# Patient Record
Sex: Female | Born: 1955
Health system: Southern US, Community
[De-identification: ages and names within clinical notes are randomized; demographics above are authoritative.]

## PROBLEM LIST (undated history)

## (undated) DIAGNOSIS — R918 Other nonspecific abnormal finding of lung field: Secondary | ICD-10-CM

## (undated) DIAGNOSIS — K219 Gastro-esophageal reflux disease without esophagitis: Secondary | ICD-10-CM

## (undated) DIAGNOSIS — M797 Fibromyalgia: Secondary | ICD-10-CM

## (undated) DIAGNOSIS — I10 Essential (primary) hypertension: Secondary | ICD-10-CM

## (undated) DIAGNOSIS — E039 Hypothyroidism, unspecified: Secondary | ICD-10-CM

## (undated) DIAGNOSIS — M51369 Other intervertebral disc degeneration, lumbar region without mention of lumbar back pain or lower extremity pain: Secondary | ICD-10-CM

## (undated) DIAGNOSIS — M5136 Other intervertebral disc degeneration, lumbar region: Secondary | ICD-10-CM

## (undated) DIAGNOSIS — G4730 Sleep apnea, unspecified: Secondary | ICD-10-CM

## (undated) DIAGNOSIS — J449 Chronic obstructive pulmonary disease, unspecified: Secondary | ICD-10-CM

## (undated) DIAGNOSIS — F411 Generalized anxiety disorder: Secondary | ICD-10-CM

## (undated) HISTORY — DX: Other intervertebral disc degeneration, lumbar region without mention of lumbar back pain or lower extremity pain: M51.369

## (undated) HISTORY — DX: Hypothyroidism, unspecified: E03.9

## (undated) HISTORY — PX: SPINAL CORD STIMULATOR INSERTION: SHX5378

## (undated) HISTORY — DX: Other intervertebral disc degeneration, lumbar region: M51.36

## (undated) HISTORY — PX: CHOLECYSTECTOMY: SHX55

## (undated) HISTORY — DX: Chronic obstructive pulmonary disease, unspecified: J44.9

## (undated) HISTORY — DX: Other nonspecific abnormal finding of lung field: R91.8

## (undated) HISTORY — DX: Essential (primary) hypertension: I10

## (undated) HISTORY — DX: Fibromyalgia: M79.7

## (undated) HISTORY — DX: Generalized anxiety disorder: F41.1

## (undated) HISTORY — PX: LAPAROSCOPIC ASSISTED VAGINAL HYSTERECTOMY: SHX5398

## (undated) HISTORY — PX: DILATION AND CURETTAGE OF UTERUS: SHX78

---

## 1978-02-24 HISTORY — PX: OTHER SURGICAL HISTORY: SHX169

## 2015-04-09 DIAGNOSIS — G894 Chronic pain syndrome: Secondary | ICD-10-CM | POA: Diagnosis not present

## 2015-04-09 DIAGNOSIS — Z79891 Long term (current) use of opiate analgesic: Secondary | ICD-10-CM | POA: Diagnosis not present

## 2015-04-09 DIAGNOSIS — M5412 Radiculopathy, cervical region: Secondary | ICD-10-CM | POA: Diagnosis not present

## 2015-04-09 DIAGNOSIS — M5136 Other intervertebral disc degeneration, lumbar region: Secondary | ICD-10-CM | POA: Diagnosis not present

## 2015-04-09 DIAGNOSIS — Z5181 Encounter for therapeutic drug level monitoring: Secondary | ICD-10-CM | POA: Diagnosis not present

## 2015-04-30 DIAGNOSIS — M5412 Radiculopathy, cervical region: Secondary | ICD-10-CM | POA: Diagnosis not present

## 2015-07-03 DIAGNOSIS — Z79891 Long term (current) use of opiate analgesic: Secondary | ICD-10-CM | POA: Diagnosis not present

## 2015-07-03 DIAGNOSIS — M5137 Other intervertebral disc degeneration, lumbosacral region: Secondary | ICD-10-CM | POA: Diagnosis not present

## 2015-07-03 DIAGNOSIS — M5412 Radiculopathy, cervical region: Secondary | ICD-10-CM | POA: Diagnosis not present

## 2015-07-03 DIAGNOSIS — G894 Chronic pain syndrome: Secondary | ICD-10-CM | POA: Diagnosis not present

## 2015-10-03 DIAGNOSIS — G894 Chronic pain syndrome: Secondary | ICD-10-CM | POA: Diagnosis not present

## 2015-10-03 DIAGNOSIS — M5137 Other intervertebral disc degeneration, lumbosacral region: Secondary | ICD-10-CM | POA: Diagnosis not present

## 2015-10-03 DIAGNOSIS — M5412 Radiculopathy, cervical region: Secondary | ICD-10-CM | POA: Diagnosis not present

## 2015-10-03 DIAGNOSIS — Z79891 Long term (current) use of opiate analgesic: Secondary | ICD-10-CM | POA: Diagnosis not present

## 2015-12-06 DIAGNOSIS — Z23 Encounter for immunization: Secondary | ICD-10-CM | POA: Diagnosis not present

## 2015-12-06 DIAGNOSIS — F332 Major depressive disorder, recurrent severe without psychotic features: Secondary | ICD-10-CM | POA: Diagnosis not present

## 2015-12-06 DIAGNOSIS — J449 Chronic obstructive pulmonary disease, unspecified: Secondary | ICD-10-CM | POA: Diagnosis not present

## 2015-12-06 DIAGNOSIS — K219 Gastro-esophageal reflux disease without esophagitis: Secondary | ICD-10-CM | POA: Diagnosis not present

## 2015-12-06 DIAGNOSIS — E559 Vitamin D deficiency, unspecified: Secondary | ICD-10-CM | POA: Diagnosis not present

## 2015-12-06 DIAGNOSIS — I10 Essential (primary) hypertension: Secondary | ICD-10-CM | POA: Diagnosis not present

## 2015-12-06 DIAGNOSIS — M810 Age-related osteoporosis without current pathological fracture: Secondary | ICD-10-CM | POA: Diagnosis not present

## 2015-12-06 DIAGNOSIS — Z6835 Body mass index (BMI) 35.0-35.9, adult: Secondary | ICD-10-CM | POA: Diagnosis not present

## 2015-12-06 DIAGNOSIS — R5382 Chronic fatigue, unspecified: Secondary | ICD-10-CM | POA: Diagnosis not present

## 2015-12-06 DIAGNOSIS — F17219 Nicotine dependence, cigarettes, with unspecified nicotine-induced disorders: Secondary | ICD-10-CM | POA: Diagnosis not present

## 2015-12-06 DIAGNOSIS — G894 Chronic pain syndrome: Secondary | ICD-10-CM | POA: Diagnosis not present

## 2015-12-06 DIAGNOSIS — E782 Mixed hyperlipidemia: Secondary | ICD-10-CM | POA: Diagnosis not present

## 2015-12-06 DIAGNOSIS — E038 Other specified hypothyroidism: Secondary | ICD-10-CM | POA: Diagnosis not present

## 2015-12-06 DIAGNOSIS — K9089 Other intestinal malabsorption: Secondary | ICD-10-CM | POA: Diagnosis not present

## 2015-12-06 DIAGNOSIS — E669 Obesity, unspecified: Secondary | ICD-10-CM | POA: Diagnosis not present

## 2015-12-08 DIAGNOSIS — R5382 Chronic fatigue, unspecified: Secondary | ICD-10-CM | POA: Diagnosis not present

## 2015-12-08 DIAGNOSIS — M810 Age-related osteoporosis without current pathological fracture: Secondary | ICD-10-CM | POA: Diagnosis not present

## 2015-12-08 DIAGNOSIS — E782 Mixed hyperlipidemia: Secondary | ICD-10-CM | POA: Diagnosis not present

## 2015-12-08 DIAGNOSIS — E559 Vitamin D deficiency, unspecified: Secondary | ICD-10-CM | POA: Diagnosis not present

## 2015-12-08 DIAGNOSIS — Z79899 Other long term (current) drug therapy: Secondary | ICD-10-CM | POA: Diagnosis not present

## 2015-12-08 DIAGNOSIS — E038 Other specified hypothyroidism: Secondary | ICD-10-CM | POA: Diagnosis not present

## 2015-12-27 DIAGNOSIS — J441 Chronic obstructive pulmonary disease with (acute) exacerbation: Secondary | ICD-10-CM | POA: Diagnosis not present

## 2015-12-27 DIAGNOSIS — D509 Iron deficiency anemia, unspecified: Secondary | ICD-10-CM | POA: Diagnosis not present

## 2015-12-27 DIAGNOSIS — F17219 Nicotine dependence, cigarettes, with unspecified nicotine-induced disorders: Secondary | ICD-10-CM | POA: Diagnosis not present

## 2015-12-27 DIAGNOSIS — E782 Mixed hyperlipidemia: Secondary | ICD-10-CM | POA: Diagnosis not present

## 2015-12-27 DIAGNOSIS — F332 Major depressive disorder, recurrent severe without psychotic features: Secondary | ICD-10-CM | POA: Diagnosis not present

## 2016-01-01 DIAGNOSIS — M5412 Radiculopathy, cervical region: Secondary | ICD-10-CM | POA: Diagnosis not present

## 2016-01-01 DIAGNOSIS — M5137 Other intervertebral disc degeneration, lumbosacral region: Secondary | ICD-10-CM | POA: Diagnosis not present

## 2016-01-01 DIAGNOSIS — Z79891 Long term (current) use of opiate analgesic: Secondary | ICD-10-CM | POA: Diagnosis not present

## 2016-01-01 DIAGNOSIS — G894 Chronic pain syndrome: Secondary | ICD-10-CM | POA: Diagnosis not present

## 2016-01-29 DIAGNOSIS — M5412 Radiculopathy, cervical region: Secondary | ICD-10-CM | POA: Diagnosis not present

## 2016-01-29 DIAGNOSIS — G894 Chronic pain syndrome: Secondary | ICD-10-CM | POA: Diagnosis not present

## 2016-01-29 DIAGNOSIS — M5137 Other intervertebral disc degeneration, lumbosacral region: Secondary | ICD-10-CM | POA: Diagnosis not present

## 2016-03-01 DIAGNOSIS — S92302A Fracture of unspecified metatarsal bone(s), left foot, initial encounter for closed fracture: Secondary | ICD-10-CM | POA: Diagnosis not present

## 2016-03-01 DIAGNOSIS — S82402A Unspecified fracture of shaft of left fibula, initial encounter for closed fracture: Secondary | ICD-10-CM | POA: Diagnosis not present

## 2016-03-04 DIAGNOSIS — S92302A Fracture of unspecified metatarsal bone(s), left foot, initial encounter for closed fracture: Secondary | ICD-10-CM | POA: Diagnosis not present

## 2016-03-04 DIAGNOSIS — S92322A Displaced fracture of second metatarsal bone, left foot, initial encounter for closed fracture: Secondary | ICD-10-CM | POA: Diagnosis not present

## 2016-03-04 DIAGNOSIS — X58XXXA Exposure to other specified factors, initial encounter: Secondary | ICD-10-CM | POA: Diagnosis not present

## 2016-03-04 DIAGNOSIS — G894 Chronic pain syndrome: Secondary | ICD-10-CM | POA: Diagnosis not present

## 2016-03-04 DIAGNOSIS — S92342A Displaced fracture of fourth metatarsal bone, left foot, initial encounter for closed fracture: Secondary | ICD-10-CM | POA: Diagnosis not present

## 2016-03-04 DIAGNOSIS — S92332A Displaced fracture of third metatarsal bone, left foot, initial encounter for closed fracture: Secondary | ICD-10-CM | POA: Diagnosis not present

## 2016-03-04 DIAGNOSIS — M5412 Radiculopathy, cervical region: Secondary | ICD-10-CM | POA: Diagnosis not present

## 2016-03-04 DIAGNOSIS — S92309A Fracture of unspecified metatarsal bone(s), unspecified foot, initial encounter for closed fracture: Secondary | ICD-10-CM | POA: Diagnosis not present

## 2016-03-04 DIAGNOSIS — M5137 Other intervertebral disc degeneration, lumbosacral region: Secondary | ICD-10-CM | POA: Diagnosis not present

## 2016-03-04 DIAGNOSIS — S92315A Nondisplaced fracture of first metatarsal bone, left foot, initial encounter for closed fracture: Secondary | ICD-10-CM | POA: Diagnosis not present

## 2016-03-25 DIAGNOSIS — S92309A Fracture of unspecified metatarsal bone(s), unspecified foot, initial encounter for closed fracture: Secondary | ICD-10-CM | POA: Diagnosis not present

## 2016-03-25 DIAGNOSIS — S92342D Displaced fracture of fourth metatarsal bone, left foot, subsequent encounter for fracture with routine healing: Secondary | ICD-10-CM | POA: Diagnosis not present

## 2016-03-25 DIAGNOSIS — S92332D Displaced fracture of third metatarsal bone, left foot, subsequent encounter for fracture with routine healing: Secondary | ICD-10-CM | POA: Diagnosis not present

## 2016-03-25 DIAGNOSIS — S92322D Displaced fracture of second metatarsal bone, left foot, subsequent encounter for fracture with routine healing: Secondary | ICD-10-CM | POA: Diagnosis not present

## 2016-04-04 DIAGNOSIS — E782 Mixed hyperlipidemia: Secondary | ICD-10-CM | POA: Diagnosis not present

## 2016-04-04 DIAGNOSIS — M5412 Radiculopathy, cervical region: Secondary | ICD-10-CM | POA: Diagnosis not present

## 2016-04-04 DIAGNOSIS — G894 Chronic pain syndrome: Secondary | ICD-10-CM | POA: Diagnosis not present

## 2016-04-04 DIAGNOSIS — M5137 Other intervertebral disc degeneration, lumbosacral region: Secondary | ICD-10-CM | POA: Diagnosis not present

## 2016-04-04 DIAGNOSIS — Z79891 Long term (current) use of opiate analgesic: Secondary | ICD-10-CM | POA: Diagnosis not present

## 2016-04-18 DIAGNOSIS — M5412 Radiculopathy, cervical region: Secondary | ICD-10-CM | POA: Diagnosis not present

## 2016-04-22 DIAGNOSIS — S92342D Displaced fracture of fourth metatarsal bone, left foot, subsequent encounter for fracture with routine healing: Secondary | ICD-10-CM | POA: Diagnosis not present

## 2016-04-22 DIAGNOSIS — S92302A Fracture of unspecified metatarsal bone(s), left foot, initial encounter for closed fracture: Secondary | ICD-10-CM | POA: Diagnosis not present

## 2016-04-22 DIAGNOSIS — S92302D Fracture of unspecified metatarsal bone(s), left foot, subsequent encounter for fracture with routine healing: Secondary | ICD-10-CM | POA: Diagnosis not present

## 2016-05-02 DIAGNOSIS — G894 Chronic pain syndrome: Secondary | ICD-10-CM | POA: Diagnosis not present

## 2016-05-02 DIAGNOSIS — M5137 Other intervertebral disc degeneration, lumbosacral region: Secondary | ICD-10-CM | POA: Diagnosis not present

## 2016-05-02 DIAGNOSIS — M5412 Radiculopathy, cervical region: Secondary | ICD-10-CM | POA: Diagnosis not present

## 2016-05-20 DIAGNOSIS — S92332D Displaced fracture of third metatarsal bone, left foot, subsequent encounter for fracture with routine healing: Secondary | ICD-10-CM | POA: Diagnosis not present

## 2016-05-20 DIAGNOSIS — S92302D Fracture of unspecified metatarsal bone(s), left foot, subsequent encounter for fracture with routine healing: Secondary | ICD-10-CM | POA: Diagnosis not present

## 2016-05-20 DIAGNOSIS — S92342D Displaced fracture of fourth metatarsal bone, left foot, subsequent encounter for fracture with routine healing: Secondary | ICD-10-CM | POA: Diagnosis not present

## 2016-05-20 DIAGNOSIS — S92309A Fracture of unspecified metatarsal bone(s), unspecified foot, initial encounter for closed fracture: Secondary | ICD-10-CM | POA: Diagnosis not present

## 2016-05-20 DIAGNOSIS — S92322D Displaced fracture of second metatarsal bone, left foot, subsequent encounter for fracture with routine healing: Secondary | ICD-10-CM | POA: Diagnosis not present

## 2016-05-29 DIAGNOSIS — M5412 Radiculopathy, cervical region: Secondary | ICD-10-CM | POA: Diagnosis not present

## 2016-05-29 DIAGNOSIS — M5137 Other intervertebral disc degeneration, lumbosacral region: Secondary | ICD-10-CM | POA: Diagnosis not present

## 2016-05-29 DIAGNOSIS — G894 Chronic pain syndrome: Secondary | ICD-10-CM | POA: Diagnosis not present

## 2016-07-08 DIAGNOSIS — I10 Essential (primary) hypertension: Secondary | ICD-10-CM | POA: Diagnosis not present

## 2016-07-08 DIAGNOSIS — K219 Gastro-esophageal reflux disease without esophagitis: Secondary | ICD-10-CM | POA: Diagnosis not present

## 2016-07-08 DIAGNOSIS — E785 Hyperlipidemia, unspecified: Secondary | ICD-10-CM | POA: Diagnosis not present

## 2016-07-08 DIAGNOSIS — Z720 Tobacco use: Secondary | ICD-10-CM | POA: Diagnosis not present

## 2016-07-08 DIAGNOSIS — E039 Hypothyroidism, unspecified: Secondary | ICD-10-CM | POA: Diagnosis not present

## 2016-07-08 DIAGNOSIS — G8929 Other chronic pain: Secondary | ICD-10-CM | POA: Diagnosis not present

## 2016-07-08 DIAGNOSIS — J449 Chronic obstructive pulmonary disease, unspecified: Secondary | ICD-10-CM | POA: Diagnosis not present

## 2016-09-08 DIAGNOSIS — K219 Gastro-esophageal reflux disease without esophagitis: Secondary | ICD-10-CM | POA: Diagnosis not present

## 2016-09-08 DIAGNOSIS — K130 Diseases of lips: Secondary | ICD-10-CM | POA: Diagnosis not present

## 2016-09-08 DIAGNOSIS — E039 Hypothyroidism, unspecified: Secondary | ICD-10-CM | POA: Diagnosis not present

## 2016-09-08 DIAGNOSIS — E559 Vitamin D deficiency, unspecified: Secondary | ICD-10-CM | POA: Diagnosis not present

## 2016-09-08 DIAGNOSIS — E785 Hyperlipidemia, unspecified: Secondary | ICD-10-CM | POA: Diagnosis not present

## 2016-09-08 DIAGNOSIS — J449 Chronic obstructive pulmonary disease, unspecified: Secondary | ICD-10-CM | POA: Diagnosis not present

## 2016-09-08 DIAGNOSIS — Z720 Tobacco use: Secondary | ICD-10-CM | POA: Diagnosis not present

## 2016-09-08 DIAGNOSIS — D649 Anemia, unspecified: Secondary | ICD-10-CM | POA: Diagnosis not present

## 2016-09-08 DIAGNOSIS — G8929 Other chronic pain: Secondary | ICD-10-CM | POA: Diagnosis not present

## 2016-09-08 DIAGNOSIS — I10 Essential (primary) hypertension: Secondary | ICD-10-CM | POA: Diagnosis not present

## 2016-09-08 DIAGNOSIS — E538 Deficiency of other specified B group vitamins: Secondary | ICD-10-CM | POA: Diagnosis not present

## 2016-09-08 DIAGNOSIS — Z0000 Encounter for general adult medical examination without abnormal findings: Secondary | ICD-10-CM | POA: Diagnosis not present

## 2016-10-01 DIAGNOSIS — M79672 Pain in left foot: Secondary | ICD-10-CM | POA: Diagnosis not present

## 2016-10-01 DIAGNOSIS — M4726 Other spondylosis with radiculopathy, lumbar region: Secondary | ICD-10-CM | POA: Diagnosis not present

## 2016-10-01 DIAGNOSIS — M797 Fibromyalgia: Secondary | ICD-10-CM | POA: Diagnosis not present

## 2016-10-01 DIAGNOSIS — M25551 Pain in right hip: Secondary | ICD-10-CM | POA: Diagnosis not present

## 2016-10-01 DIAGNOSIS — G894 Chronic pain syndrome: Secondary | ICD-10-CM | POA: Diagnosis not present

## 2016-10-01 DIAGNOSIS — M150 Primary generalized (osteo)arthritis: Secondary | ICD-10-CM | POA: Diagnosis not present

## 2016-10-01 DIAGNOSIS — M4722 Other spondylosis with radiculopathy, cervical region: Secondary | ICD-10-CM | POA: Diagnosis not present

## 2016-10-20 DIAGNOSIS — M4722 Other spondylosis with radiculopathy, cervical region: Secondary | ICD-10-CM | POA: Diagnosis not present

## 2016-10-20 DIAGNOSIS — M4726 Other spondylosis with radiculopathy, lumbar region: Secondary | ICD-10-CM | POA: Diagnosis not present

## 2016-10-20 DIAGNOSIS — G894 Chronic pain syndrome: Secondary | ICD-10-CM | POA: Diagnosis not present

## 2016-10-20 DIAGNOSIS — M150 Primary generalized (osteo)arthritis: Secondary | ICD-10-CM | POA: Diagnosis not present

## 2016-10-28 DIAGNOSIS — D649 Anemia, unspecified: Secondary | ICD-10-CM | POA: Diagnosis not present

## 2016-11-14 DIAGNOSIS — M150 Primary generalized (osteo)arthritis: Secondary | ICD-10-CM | POA: Diagnosis not present

## 2016-11-14 DIAGNOSIS — M4726 Other spondylosis with radiculopathy, lumbar region: Secondary | ICD-10-CM | POA: Diagnosis not present

## 2016-11-14 DIAGNOSIS — M4722 Other spondylosis with radiculopathy, cervical region: Secondary | ICD-10-CM | POA: Diagnosis not present

## 2016-11-14 DIAGNOSIS — G894 Chronic pain syndrome: Secondary | ICD-10-CM | POA: Diagnosis not present

## 2016-11-29 DIAGNOSIS — Z23 Encounter for immunization: Secondary | ICD-10-CM | POA: Diagnosis not present

## 2016-12-11 DIAGNOSIS — G894 Chronic pain syndrome: Secondary | ICD-10-CM | POA: Diagnosis not present

## 2016-12-11 DIAGNOSIS — I10 Essential (primary) hypertension: Secondary | ICD-10-CM | POA: Diagnosis not present

## 2016-12-11 DIAGNOSIS — M4722 Other spondylosis with radiculopathy, cervical region: Secondary | ICD-10-CM | POA: Diagnosis not present

## 2016-12-11 DIAGNOSIS — D649 Anemia, unspecified: Secondary | ICD-10-CM | POA: Diagnosis not present

## 2016-12-11 DIAGNOSIS — E876 Hypokalemia: Secondary | ICD-10-CM | POA: Diagnosis not present

## 2016-12-11 DIAGNOSIS — M150 Primary generalized (osteo)arthritis: Secondary | ICD-10-CM | POA: Diagnosis not present

## 2016-12-11 DIAGNOSIS — G8929 Other chronic pain: Secondary | ICD-10-CM | POA: Diagnosis not present

## 2016-12-11 DIAGNOSIS — G4733 Obstructive sleep apnea (adult) (pediatric): Secondary | ICD-10-CM | POA: Diagnosis not present

## 2016-12-11 DIAGNOSIS — Z1211 Encounter for screening for malignant neoplasm of colon: Secondary | ICD-10-CM | POA: Diagnosis not present

## 2016-12-11 DIAGNOSIS — M25531 Pain in right wrist: Secondary | ICD-10-CM | POA: Diagnosis not present

## 2016-12-11 DIAGNOSIS — M4726 Other spondylosis with radiculopathy, lumbar region: Secondary | ICD-10-CM | POA: Diagnosis not present

## 2016-12-11 DIAGNOSIS — Z720 Tobacco use: Secondary | ICD-10-CM | POA: Diagnosis not present

## 2016-12-11 DIAGNOSIS — K130 Diseases of lips: Secondary | ICD-10-CM | POA: Diagnosis not present

## 2017-01-05 DIAGNOSIS — D509 Iron deficiency anemia, unspecified: Secondary | ICD-10-CM | POA: Diagnosis not present

## 2017-01-12 DIAGNOSIS — G894 Chronic pain syndrome: Secondary | ICD-10-CM | POA: Diagnosis not present

## 2017-01-12 DIAGNOSIS — M150 Primary generalized (osteo)arthritis: Secondary | ICD-10-CM | POA: Diagnosis not present

## 2017-01-12 DIAGNOSIS — M4726 Other spondylosis with radiculopathy, lumbar region: Secondary | ICD-10-CM | POA: Diagnosis not present

## 2017-01-12 DIAGNOSIS — M4722 Other spondylosis with radiculopathy, cervical region: Secondary | ICD-10-CM | POA: Diagnosis not present

## 2017-01-21 DIAGNOSIS — K229 Disease of esophagus, unspecified: Secondary | ICD-10-CM | POA: Diagnosis not present

## 2017-01-21 DIAGNOSIS — D509 Iron deficiency anemia, unspecified: Secondary | ICD-10-CM | POA: Diagnosis not present

## 2017-01-21 DIAGNOSIS — B3781 Candidal esophagitis: Secondary | ICD-10-CM | POA: Diagnosis not present

## 2017-01-27 DIAGNOSIS — B3781 Candidal esophagitis: Secondary | ICD-10-CM | POA: Diagnosis not present

## 2017-02-09 DIAGNOSIS — G894 Chronic pain syndrome: Secondary | ICD-10-CM | POA: Diagnosis not present

## 2017-02-09 DIAGNOSIS — M4722 Other spondylosis with radiculopathy, cervical region: Secondary | ICD-10-CM | POA: Diagnosis not present

## 2017-02-09 DIAGNOSIS — M150 Primary generalized (osteo)arthritis: Secondary | ICD-10-CM | POA: Diagnosis not present

## 2017-02-09 DIAGNOSIS — M4726 Other spondylosis with radiculopathy, lumbar region: Secondary | ICD-10-CM | POA: Diagnosis not present

## 2017-03-10 DIAGNOSIS — M4722 Other spondylosis with radiculopathy, cervical region: Secondary | ICD-10-CM | POA: Diagnosis not present

## 2017-03-10 DIAGNOSIS — M150 Primary generalized (osteo)arthritis: Secondary | ICD-10-CM | POA: Diagnosis not present

## 2017-03-10 DIAGNOSIS — M4726 Other spondylosis with radiculopathy, lumbar region: Secondary | ICD-10-CM | POA: Diagnosis not present

## 2017-03-10 DIAGNOSIS — G894 Chronic pain syndrome: Secondary | ICD-10-CM | POA: Diagnosis not present

## 2017-03-16 DIAGNOSIS — E785 Hyperlipidemia, unspecified: Secondary | ICD-10-CM | POA: Diagnosis not present

## 2017-03-16 DIAGNOSIS — G4733 Obstructive sleep apnea (adult) (pediatric): Secondary | ICD-10-CM | POA: Diagnosis not present

## 2017-03-16 DIAGNOSIS — I10 Essential (primary) hypertension: Secondary | ICD-10-CM | POA: Diagnosis not present

## 2017-03-16 DIAGNOSIS — D509 Iron deficiency anemia, unspecified: Secondary | ICD-10-CM | POA: Diagnosis not present

## 2017-03-16 DIAGNOSIS — E039 Hypothyroidism, unspecified: Secondary | ICD-10-CM | POA: Diagnosis not present

## 2017-03-16 DIAGNOSIS — S00419A Abrasion of unspecified ear, initial encounter: Secondary | ICD-10-CM | POA: Diagnosis not present

## 2017-03-16 DIAGNOSIS — G8929 Other chronic pain: Secondary | ICD-10-CM | POA: Diagnosis not present

## 2017-04-07 DIAGNOSIS — M4726 Other spondylosis with radiculopathy, lumbar region: Secondary | ICD-10-CM | POA: Diagnosis not present

## 2017-04-07 DIAGNOSIS — G894 Chronic pain syndrome: Secondary | ICD-10-CM | POA: Diagnosis not present

## 2017-04-07 DIAGNOSIS — M150 Primary generalized (osteo)arthritis: Secondary | ICD-10-CM | POA: Diagnosis not present

## 2017-04-07 DIAGNOSIS — M4722 Other spondylosis with radiculopathy, cervical region: Secondary | ICD-10-CM | POA: Diagnosis not present

## 2017-04-29 DIAGNOSIS — B079 Viral wart, unspecified: Secondary | ICD-10-CM | POA: Diagnosis not present

## 2017-04-29 DIAGNOSIS — L97522 Non-pressure chronic ulcer of other part of left foot with fat layer exposed: Secondary | ICD-10-CM | POA: Diagnosis not present

## 2017-04-29 DIAGNOSIS — M21622 Bunionette of left foot: Secondary | ICD-10-CM | POA: Diagnosis not present

## 2017-04-29 DIAGNOSIS — M79672 Pain in left foot: Secondary | ICD-10-CM | POA: Diagnosis not present

## 2017-05-15 DIAGNOSIS — L97521 Non-pressure chronic ulcer of other part of left foot limited to breakdown of skin: Secondary | ICD-10-CM | POA: Diagnosis not present

## 2017-05-15 DIAGNOSIS — B079 Viral wart, unspecified: Secondary | ICD-10-CM | POA: Diagnosis not present

## 2017-05-15 DIAGNOSIS — M79672 Pain in left foot: Secondary | ICD-10-CM | POA: Diagnosis not present

## 2017-05-19 DIAGNOSIS — H2513 Age-related nuclear cataract, bilateral: Secondary | ICD-10-CM | POA: Diagnosis not present

## 2017-06-04 DIAGNOSIS — M150 Primary generalized (osteo)arthritis: Secondary | ICD-10-CM | POA: Diagnosis not present

## 2017-06-04 DIAGNOSIS — G894 Chronic pain syndrome: Secondary | ICD-10-CM | POA: Diagnosis not present

## 2017-06-04 DIAGNOSIS — M4722 Other spondylosis with radiculopathy, cervical region: Secondary | ICD-10-CM | POA: Diagnosis not present

## 2017-06-04 DIAGNOSIS — M4726 Other spondylosis with radiculopathy, lumbar region: Secondary | ICD-10-CM | POA: Diagnosis not present

## 2017-08-04 DIAGNOSIS — Z79891 Long term (current) use of opiate analgesic: Secondary | ICD-10-CM | POA: Diagnosis not present

## 2017-08-04 DIAGNOSIS — M4726 Other spondylosis with radiculopathy, lumbar region: Secondary | ICD-10-CM | POA: Diagnosis not present

## 2017-08-04 DIAGNOSIS — M4722 Other spondylosis with radiculopathy, cervical region: Secondary | ICD-10-CM | POA: Diagnosis not present

## 2017-08-04 DIAGNOSIS — G894 Chronic pain syndrome: Secondary | ICD-10-CM | POA: Diagnosis not present

## 2017-08-04 DIAGNOSIS — M150 Primary generalized (osteo)arthritis: Secondary | ICD-10-CM | POA: Diagnosis not present

## 2017-09-09 DIAGNOSIS — K219 Gastro-esophageal reflux disease without esophagitis: Secondary | ICD-10-CM | POA: Diagnosis not present

## 2017-09-09 DIAGNOSIS — J449 Chronic obstructive pulmonary disease, unspecified: Secondary | ICD-10-CM | POA: Diagnosis not present

## 2017-09-09 DIAGNOSIS — M810 Age-related osteoporosis without current pathological fracture: Secondary | ICD-10-CM | POA: Diagnosis not present

## 2017-09-09 DIAGNOSIS — I10 Essential (primary) hypertension: Secondary | ICD-10-CM | POA: Diagnosis not present

## 2017-09-09 DIAGNOSIS — E785 Hyperlipidemia, unspecified: Secondary | ICD-10-CM | POA: Diagnosis not present

## 2017-09-09 DIAGNOSIS — Z0000 Encounter for general adult medical examination without abnormal findings: Secondary | ICD-10-CM | POA: Diagnosis not present

## 2017-09-09 DIAGNOSIS — E039 Hypothyroidism, unspecified: Secondary | ICD-10-CM | POA: Diagnosis not present

## 2017-09-09 DIAGNOSIS — G8929 Other chronic pain: Secondary | ICD-10-CM | POA: Diagnosis not present

## 2017-09-09 DIAGNOSIS — Z720 Tobacco use: Secondary | ICD-10-CM | POA: Diagnosis not present

## 2017-09-09 DIAGNOSIS — Z1239 Encounter for other screening for malignant neoplasm of breast: Secondary | ICD-10-CM | POA: Diagnosis not present

## 2017-09-10 ENCOUNTER — Other Ambulatory Visit (HOSPITAL_COMMUNITY): Payer: Self-pay | Admitting: Family Medicine

## 2017-09-10 DIAGNOSIS — Z1231 Encounter for screening mammogram for malignant neoplasm of breast: Secondary | ICD-10-CM

## 2017-09-21 ENCOUNTER — Other Ambulatory Visit (HOSPITAL_COMMUNITY): Payer: Self-pay | Admitting: Family Medicine

## 2017-09-21 DIAGNOSIS — M810 Age-related osteoporosis without current pathological fracture: Secondary | ICD-10-CM

## 2017-09-21 DIAGNOSIS — Z23 Encounter for immunization: Secondary | ICD-10-CM | POA: Diagnosis not present

## 2017-09-29 DIAGNOSIS — M4726 Other spondylosis with radiculopathy, lumbar region: Secondary | ICD-10-CM | POA: Diagnosis not present

## 2017-09-29 DIAGNOSIS — M4722 Other spondylosis with radiculopathy, cervical region: Secondary | ICD-10-CM | POA: Diagnosis not present

## 2017-09-29 DIAGNOSIS — M150 Primary generalized (osteo)arthritis: Secondary | ICD-10-CM | POA: Diagnosis not present

## 2017-09-29 DIAGNOSIS — G894 Chronic pain syndrome: Secondary | ICD-10-CM | POA: Diagnosis not present

## 2017-10-19 DIAGNOSIS — Z23 Encounter for immunization: Secondary | ICD-10-CM | POA: Diagnosis not present

## 2017-11-26 DIAGNOSIS — M4726 Other spondylosis with radiculopathy, lumbar region: Secondary | ICD-10-CM | POA: Diagnosis not present

## 2017-11-26 DIAGNOSIS — G894 Chronic pain syndrome: Secondary | ICD-10-CM | POA: Diagnosis not present

## 2017-11-26 DIAGNOSIS — M4722 Other spondylosis with radiculopathy, cervical region: Secondary | ICD-10-CM | POA: Diagnosis not present

## 2017-11-26 DIAGNOSIS — M150 Primary generalized (osteo)arthritis: Secondary | ICD-10-CM | POA: Diagnosis not present

## 2017-12-23 ENCOUNTER — Inpatient Hospital Stay (HOSPITAL_COMMUNITY): Admission: RE | Admit: 2017-12-23 | Payer: Self-pay | Source: Ambulatory Visit

## 2017-12-23 ENCOUNTER — Ambulatory Visit (HOSPITAL_COMMUNITY)
Admission: RE | Admit: 2017-12-23 | Discharge: 2017-12-23 | Disposition: A | Discharge status: Home / Self Care | Payer: Medicare Other | Source: Ambulatory Visit | Attending: Family Medicine | Admitting: Family Medicine

## 2017-12-23 ENCOUNTER — Encounter (HOSPITAL_COMMUNITY): Payer: Self-pay | Admitting: Radiology

## 2017-12-23 DIAGNOSIS — M810 Age-related osteoporosis without current pathological fracture: Secondary | ICD-10-CM | POA: Insufficient documentation

## 2017-12-23 DIAGNOSIS — Z1231 Encounter for screening mammogram for malignant neoplasm of breast: Secondary | ICD-10-CM | POA: Insufficient documentation

## 2017-12-23 DIAGNOSIS — Z780 Asymptomatic menopausal state: Secondary | ICD-10-CM | POA: Diagnosis not present

## 2018-01-19 DIAGNOSIS — M4726 Other spondylosis with radiculopathy, lumbar region: Secondary | ICD-10-CM | POA: Diagnosis not present

## 2018-01-19 DIAGNOSIS — M150 Primary generalized (osteo)arthritis: Secondary | ICD-10-CM | POA: Diagnosis not present

## 2018-01-19 DIAGNOSIS — G894 Chronic pain syndrome: Secondary | ICD-10-CM | POA: Diagnosis not present

## 2018-01-19 DIAGNOSIS — M4722 Other spondylosis with radiculopathy, cervical region: Secondary | ICD-10-CM | POA: Diagnosis not present

## 2018-03-16 DIAGNOSIS — Z79891 Long term (current) use of opiate analgesic: Secondary | ICD-10-CM | POA: Diagnosis not present

## 2018-03-16 DIAGNOSIS — M4726 Other spondylosis with radiculopathy, lumbar region: Secondary | ICD-10-CM | POA: Diagnosis not present

## 2018-03-16 DIAGNOSIS — G894 Chronic pain syndrome: Secondary | ICD-10-CM | POA: Diagnosis not present

## 2018-03-16 DIAGNOSIS — M150 Primary generalized (osteo)arthritis: Secondary | ICD-10-CM | POA: Diagnosis not present

## 2018-03-16 DIAGNOSIS — M4722 Other spondylosis with radiculopathy, cervical region: Secondary | ICD-10-CM | POA: Diagnosis not present

## 2018-05-11 DIAGNOSIS — M150 Primary generalized (osteo)arthritis: Secondary | ICD-10-CM | POA: Diagnosis not present

## 2018-05-11 DIAGNOSIS — M4722 Other spondylosis with radiculopathy, cervical region: Secondary | ICD-10-CM | POA: Diagnosis not present

## 2018-05-11 DIAGNOSIS — M4726 Other spondylosis with radiculopathy, lumbar region: Secondary | ICD-10-CM | POA: Diagnosis not present

## 2018-05-11 DIAGNOSIS — G894 Chronic pain syndrome: Secondary | ICD-10-CM | POA: Diagnosis not present

## 2018-07-06 DIAGNOSIS — M150 Primary generalized (osteo)arthritis: Secondary | ICD-10-CM | POA: Diagnosis not present

## 2018-07-06 DIAGNOSIS — M79672 Pain in left foot: Secondary | ICD-10-CM | POA: Diagnosis not present

## 2018-07-06 DIAGNOSIS — G894 Chronic pain syndrome: Secondary | ICD-10-CM | POA: Diagnosis not present

## 2018-07-06 DIAGNOSIS — M4722 Other spondylosis with radiculopathy, cervical region: Secondary | ICD-10-CM | POA: Diagnosis not present

## 2018-07-06 DIAGNOSIS — M4726 Other spondylosis with radiculopathy, lumbar region: Secondary | ICD-10-CM | POA: Diagnosis not present

## 2018-08-04 DIAGNOSIS — M4726 Other spondylosis with radiculopathy, lumbar region: Secondary | ICD-10-CM | POA: Diagnosis not present

## 2018-08-04 DIAGNOSIS — M797 Fibromyalgia: Secondary | ICD-10-CM | POA: Diagnosis not present

## 2018-08-04 DIAGNOSIS — M150 Primary generalized (osteo)arthritis: Secondary | ICD-10-CM | POA: Diagnosis not present

## 2018-08-04 DIAGNOSIS — M4722 Other spondylosis with radiculopathy, cervical region: Secondary | ICD-10-CM | POA: Diagnosis not present

## 2018-08-04 DIAGNOSIS — G894 Chronic pain syndrome: Secondary | ICD-10-CM | POA: Diagnosis not present

## 2018-08-26 DIAGNOSIS — G894 Chronic pain syndrome: Secondary | ICD-10-CM | POA: Diagnosis not present

## 2018-08-26 DIAGNOSIS — M4726 Other spondylosis with radiculopathy, lumbar region: Secondary | ICD-10-CM | POA: Diagnosis not present

## 2018-08-26 DIAGNOSIS — M4722 Other spondylosis with radiculopathy, cervical region: Secondary | ICD-10-CM | POA: Diagnosis not present

## 2018-08-26 DIAGNOSIS — M150 Primary generalized (osteo)arthritis: Secondary | ICD-10-CM | POA: Diagnosis not present

## 2018-09-14 DIAGNOSIS — I10 Essential (primary) hypertension: Secondary | ICD-10-CM | POA: Diagnosis not present

## 2018-09-14 DIAGNOSIS — E785 Hyperlipidemia, unspecified: Secondary | ICD-10-CM | POA: Diagnosis not present

## 2018-09-14 DIAGNOSIS — M810 Age-related osteoporosis without current pathological fracture: Secondary | ICD-10-CM | POA: Diagnosis not present

## 2018-09-14 DIAGNOSIS — K219 Gastro-esophageal reflux disease without esophagitis: Secondary | ICD-10-CM | POA: Diagnosis not present

## 2018-09-14 DIAGNOSIS — E039 Hypothyroidism, unspecified: Secondary | ICD-10-CM | POA: Diagnosis not present

## 2018-09-14 DIAGNOSIS — J449 Chronic obstructive pulmonary disease, unspecified: Secondary | ICD-10-CM | POA: Diagnosis not present

## 2018-09-14 DIAGNOSIS — E538 Deficiency of other specified B group vitamins: Secondary | ICD-10-CM | POA: Diagnosis not present

## 2018-09-28 DIAGNOSIS — M4726 Other spondylosis with radiculopathy, lumbar region: Secondary | ICD-10-CM | POA: Diagnosis not present

## 2018-09-28 DIAGNOSIS — M4722 Other spondylosis with radiculopathy, cervical region: Secondary | ICD-10-CM | POA: Diagnosis not present

## 2018-09-28 DIAGNOSIS — M150 Primary generalized (osteo)arthritis: Secondary | ICD-10-CM | POA: Diagnosis not present

## 2018-09-28 DIAGNOSIS — G894 Chronic pain syndrome: Secondary | ICD-10-CM | POA: Diagnosis not present

## 2018-10-26 DIAGNOSIS — G894 Chronic pain syndrome: Secondary | ICD-10-CM | POA: Diagnosis not present

## 2018-10-26 DIAGNOSIS — M4726 Other spondylosis with radiculopathy, lumbar region: Secondary | ICD-10-CM | POA: Diagnosis not present

## 2018-10-26 DIAGNOSIS — M4722 Other spondylosis with radiculopathy, cervical region: Secondary | ICD-10-CM | POA: Diagnosis not present

## 2018-10-26 DIAGNOSIS — M150 Primary generalized (osteo)arthritis: Secondary | ICD-10-CM | POA: Diagnosis not present

## 2018-12-15 DIAGNOSIS — R5382 Chronic fatigue, unspecified: Secondary | ICD-10-CM | POA: Diagnosis not present

## 2018-12-15 DIAGNOSIS — E039 Hypothyroidism, unspecified: Secondary | ICD-10-CM | POA: Diagnosis not present

## 2018-12-15 DIAGNOSIS — Z23 Encounter for immunization: Secondary | ICD-10-CM | POA: Diagnosis not present

## 2018-12-15 DIAGNOSIS — E785 Hyperlipidemia, unspecified: Secondary | ICD-10-CM | POA: Diagnosis not present

## 2018-12-15 DIAGNOSIS — K219 Gastro-esophageal reflux disease without esophagitis: Secondary | ICD-10-CM | POA: Diagnosis not present

## 2018-12-15 DIAGNOSIS — E559 Vitamin D deficiency, unspecified: Secondary | ICD-10-CM | POA: Diagnosis not present

## 2018-12-15 DIAGNOSIS — J449 Chronic obstructive pulmonary disease, unspecified: Secondary | ICD-10-CM | POA: Diagnosis not present

## 2018-12-21 DIAGNOSIS — G894 Chronic pain syndrome: Secondary | ICD-10-CM | POA: Diagnosis not present

## 2018-12-21 DIAGNOSIS — M4722 Other spondylosis with radiculopathy, cervical region: Secondary | ICD-10-CM | POA: Diagnosis not present

## 2018-12-21 DIAGNOSIS — M150 Primary generalized (osteo)arthritis: Secondary | ICD-10-CM | POA: Diagnosis not present

## 2018-12-21 DIAGNOSIS — M4726 Other spondylosis with radiculopathy, lumbar region: Secondary | ICD-10-CM | POA: Diagnosis not present

## 2019-02-08 ENCOUNTER — Other Ambulatory Visit (HOSPITAL_COMMUNITY): Payer: Self-pay | Admitting: Nurse Practitioner

## 2019-02-08 DIAGNOSIS — Z1231 Encounter for screening mammogram for malignant neoplasm of breast: Secondary | ICD-10-CM

## 2019-02-23 ENCOUNTER — Ambulatory Visit (HOSPITAL_COMMUNITY)
Admission: RE | Admit: 2019-02-23 | Discharge: 2019-02-23 | Disposition: A | Discharge status: Home / Self Care | Payer: Medicare Other | Source: Ambulatory Visit | Attending: Nurse Practitioner | Admitting: Nurse Practitioner

## 2019-02-23 ENCOUNTER — Other Ambulatory Visit: Payer: Self-pay

## 2019-02-23 DIAGNOSIS — Z1231 Encounter for screening mammogram for malignant neoplasm of breast: Secondary | ICD-10-CM | POA: Insufficient documentation

## 2020-02-29 DIAGNOSIS — M4726 Other spondylosis with radiculopathy, lumbar region: Secondary | ICD-10-CM | POA: Diagnosis not present

## 2020-02-29 DIAGNOSIS — M150 Primary generalized (osteo)arthritis: Secondary | ICD-10-CM | POA: Diagnosis not present

## 2020-02-29 DIAGNOSIS — G894 Chronic pain syndrome: Secondary | ICD-10-CM | POA: Diagnosis not present

## 2020-02-29 DIAGNOSIS — M4722 Other spondylosis with radiculopathy, cervical region: Secondary | ICD-10-CM | POA: Diagnosis not present

## 2020-03-21 DIAGNOSIS — Z0000 Encounter for general adult medical examination without abnormal findings: Secondary | ICD-10-CM | POA: Diagnosis not present

## 2020-03-21 DIAGNOSIS — Z23 Encounter for immunization: Secondary | ICD-10-CM | POA: Diagnosis not present

## 2020-03-21 DIAGNOSIS — I10 Essential (primary) hypertension: Secondary | ICD-10-CM | POA: Diagnosis not present

## 2020-03-21 DIAGNOSIS — E785 Hyperlipidemia, unspecified: Secondary | ICD-10-CM | POA: Diagnosis not present

## 2020-03-22 ENCOUNTER — Other Ambulatory Visit (HOSPITAL_COMMUNITY): Payer: Self-pay | Admitting: Nurse Practitioner

## 2020-03-22 DIAGNOSIS — Z1231 Encounter for screening mammogram for malignant neoplasm of breast: Secondary | ICD-10-CM

## 2020-03-28 DIAGNOSIS — R748 Abnormal levels of other serum enzymes: Secondary | ICD-10-CM | POA: Diagnosis not present

## 2020-03-28 DIAGNOSIS — I10 Essential (primary) hypertension: Secondary | ICD-10-CM | POA: Diagnosis not present

## 2020-03-28 DIAGNOSIS — E785 Hyperlipidemia, unspecified: Secondary | ICD-10-CM | POA: Diagnosis not present

## 2020-03-28 DIAGNOSIS — E039 Hypothyroidism, unspecified: Secondary | ICD-10-CM | POA: Diagnosis not present

## 2020-03-28 DIAGNOSIS — Z0000 Encounter for general adult medical examination without abnormal findings: Secondary | ICD-10-CM | POA: Diagnosis not present

## 2020-04-25 DIAGNOSIS — E039 Hypothyroidism, unspecified: Secondary | ICD-10-CM | POA: Diagnosis not present

## 2020-04-25 DIAGNOSIS — I10 Essential (primary) hypertension: Secondary | ICD-10-CM | POA: Diagnosis not present

## 2020-04-25 DIAGNOSIS — G894 Chronic pain syndrome: Secondary | ICD-10-CM | POA: Diagnosis not present

## 2020-04-25 DIAGNOSIS — M5136 Other intervertebral disc degeneration, lumbar region: Secondary | ICD-10-CM | POA: Diagnosis not present

## 2020-04-25 DIAGNOSIS — M797 Fibromyalgia: Secondary | ICD-10-CM | POA: Diagnosis not present

## 2020-04-25 DIAGNOSIS — M4726 Other spondylosis with radiculopathy, lumbar region: Secondary | ICD-10-CM | POA: Diagnosis not present

## 2020-04-25 DIAGNOSIS — M150 Primary generalized (osteo)arthritis: Secondary | ICD-10-CM | POA: Diagnosis not present

## 2020-04-25 DIAGNOSIS — M4722 Other spondylosis with radiculopathy, cervical region: Secondary | ICD-10-CM | POA: Diagnosis not present

## 2020-05-03 ENCOUNTER — Other Ambulatory Visit (HOSPITAL_COMMUNITY): Payer: Self-pay | Admitting: Internal Medicine

## 2020-05-03 DIAGNOSIS — Z1231 Encounter for screening mammogram for malignant neoplasm of breast: Secondary | ICD-10-CM

## 2020-05-16 ENCOUNTER — Ambulatory Visit (HOSPITAL_COMMUNITY)
Admission: RE | Admit: 2020-05-16 | Discharge: 2020-05-16 | Disposition: A | Discharge status: Home / Self Care | Payer: Medicare Other | Source: Ambulatory Visit | Attending: Internal Medicine | Admitting: Internal Medicine

## 2020-05-16 DIAGNOSIS — M797 Fibromyalgia: Secondary | ICD-10-CM | POA: Diagnosis not present

## 2020-05-16 DIAGNOSIS — J449 Chronic obstructive pulmonary disease, unspecified: Secondary | ICD-10-CM | POA: Diagnosis not present

## 2020-05-16 DIAGNOSIS — Z1231 Encounter for screening mammogram for malignant neoplasm of breast: Secondary | ICD-10-CM | POA: Insufficient documentation

## 2020-05-16 DIAGNOSIS — R531 Weakness: Secondary | ICD-10-CM | POA: Diagnosis not present

## 2020-05-16 DIAGNOSIS — I10 Essential (primary) hypertension: Secondary | ICD-10-CM | POA: Diagnosis not present

## 2020-05-17 DIAGNOSIS — I10 Essential (primary) hypertension: Secondary | ICD-10-CM | POA: Diagnosis not present

## 2020-05-17 DIAGNOSIS — Z0001 Encounter for general adult medical examination with abnormal findings: Secondary | ICD-10-CM | POA: Diagnosis not present

## 2020-05-17 DIAGNOSIS — Z79899 Other long term (current) drug therapy: Secondary | ICD-10-CM | POA: Diagnosis not present

## 2020-06-06 DIAGNOSIS — N390 Urinary tract infection, site not specified: Secondary | ICD-10-CM | POA: Diagnosis not present

## 2020-06-17 DIAGNOSIS — J449 Chronic obstructive pulmonary disease, unspecified: Secondary | ICD-10-CM | POA: Diagnosis not present

## 2020-06-17 DIAGNOSIS — E039 Hypothyroidism, unspecified: Secondary | ICD-10-CM | POA: Diagnosis not present

## 2020-06-18 ENCOUNTER — Other Ambulatory Visit: Payer: Self-pay

## 2020-06-18 ENCOUNTER — Ambulatory Visit (INDEPENDENT_AMBULATORY_CARE_PROVIDER_SITE_OTHER): Payer: Medicare Other | Admitting: Adult Health

## 2020-06-18 ENCOUNTER — Encounter: Payer: Self-pay | Admitting: Adult Health

## 2020-06-18 VITALS — BP 113/67 | HR 74 | Ht 60.0 in | Wt 152.0 lb

## 2020-06-18 DIAGNOSIS — N898 Other specified noninflammatory disorders of vagina: Secondary | ICD-10-CM

## 2020-06-18 DIAGNOSIS — N766 Ulceration of vulva: Secondary | ICD-10-CM | POA: Insufficient documentation

## 2020-06-18 MED ORDER — VALACYCLOVIR HCL 1 G PO TABS: 1000.0000 mg | ORAL_TABLET | Freq: Two times a day (BID) | ORAL | 1 refills | Status: DC

## 2020-06-18 MED ORDER — FLUCONAZOLE 150 MG PO TABS: ORAL_TABLET | ORAL | 1 refills | Status: DC

## 2020-06-18 NOTE — Progress Notes (Signed)
  Subjective:     Patient ID: Carrieanne Kleen, female   DOB: 1956-01-26, 65 y.o.   MRN: [id number]  HPI Jacquelyn is a 65 year old white female, divorced, sp hysterectomy in complaining of itching in vaginal area on and off for years, seems worse lately.  She is a new pt. PCP is Dr Felecia Shelling.  Review of Systems +itching in vaginal area Goes and comes for years Not sexually active. Reviewed past medical,surgical, social and family history. Reviewed medications and allergies.     Objective:   Physical Exam BP 113/67 (BP Location: Left Arm, Patient Position: Sitting, Cuff Size: Normal)   Pulse 74   Ht 5' (1.524 m)   Wt 152 lb (68.9 kg)   BMI 29.69 kg/m  Skin warm and dry.Pelvic: external genitalia is normal in appearance,has 3 ulcerated areas right inner labia, vagina: pale, no lesions on vaginal cuff,urethra has pink caruncle, cervix and uterus are absent,adnexa: no masses or tenderness noted. Bladder is non tender and no masses felt. Herpes culture right inner labia. AA is 1 Fall risk is low PHQ 9 score is 3 GAD 7 Score is 3  Upstream - 06/18/20 0905      Pregnancy Intention Screening   Does the patient want to become pregnant in the next year? N/A    Does the patient's partner want to become pregnant in the next year? N/A    Would the patient like to discuss contraceptive options today? N/A      Contraception Wrap Up   Current Method --   hyst   End Method --   hyst   Contraception Counseling Provided No         Examination chaperoned by Marchelle Folks LPN    Assessment:     1. Itching in the vaginal area Will rx diflucan  2. Vulvar ulceration Herpes culture sent Will rx valtrex Meds ordered this encounter  Medications  . valACYclovir (VALTREX) 1000 MG tablet    Sig: Take 1 tablet (1,000 mg total) by mouth 2 (two) times daily.    Dispense:  20 tablet    Refill:  1    Order Specific Question:   Supervising Provider    Answer:   Despina Hidden, LUTHER H [[id number]]  . fluconazole (DIFLUCAN)  150 MG tablet    Sig: Take 1 now and 1 in 3 days    Dispense:  2 tablet    Refill:  1    Order Specific Question:   Supervising Provider    Answer:   Lazaro Arms [[id number]]      Plan:    Follow up in 2 weeks for recheck

## 2020-06-20 DIAGNOSIS — M150 Primary generalized (osteo)arthritis: Secondary | ICD-10-CM | POA: Diagnosis not present

## 2020-06-20 DIAGNOSIS — M4722 Other spondylosis with radiculopathy, cervical region: Secondary | ICD-10-CM | POA: Diagnosis not present

## 2020-06-20 DIAGNOSIS — M4726 Other spondylosis with radiculopathy, lumbar region: Secondary | ICD-10-CM | POA: Diagnosis not present

## 2020-06-20 DIAGNOSIS — G894 Chronic pain syndrome: Secondary | ICD-10-CM | POA: Diagnosis not present

## 2020-06-20 LAB — HERPES SIMPLEX VIRUS CULTURE

## 2020-06-21 ENCOUNTER — Telehealth: Payer: Self-pay | Admitting: Adult Health

## 2020-06-21 NOTE — Telephone Encounter (Signed)
Left message to call me back about lab results

## 2020-06-25 ENCOUNTER — Telehealth: Payer: Self-pay | Admitting: Adult Health

## 2020-06-25 DIAGNOSIS — N766 Ulceration of vulva: Secondary | ICD-10-CM

## 2020-06-25 NOTE — Telephone Encounter (Signed)
Left message I called, and that you have seen labs results.Can call me or we can talk at visit, hope you feel better

## 2020-07-04 ENCOUNTER — Encounter: Payer: Self-pay | Admitting: Adult Health

## 2020-07-04 ENCOUNTER — Other Ambulatory Visit: Payer: Self-pay

## 2020-07-04 ENCOUNTER — Ambulatory Visit: Payer: Medicare Other | Admitting: Adult Health

## 2020-07-04 VITALS — BP 120/69 | HR 64 | Ht 60.0 in | Wt 144.0 lb

## 2020-07-04 DIAGNOSIS — B009 Herpesviral infection, unspecified: Secondary | ICD-10-CM

## 2020-07-04 NOTE — Progress Notes (Signed)
  Subjective:     Patient ID: Dawn Hughes, female   DOB: 03/28/1955, 65 y.o.   MRN: [id number]  HPI Dawn Hughes is a 65 year old white female, divorced, sp hysterectomy, back in follow up on vaginal itching, and found to have 3 ulceration right labia and was treated with valtrex and diflucan, and so great now.herpes culture HSV 2.  PCP is Dr Kateri Plummer  Review of Systems No itching or pain No sex in 10 years  Reviewed past medical,surgical, social and family history. Reviewed medications and allergies.     Objective:   Physical Exam BP 120/69 (BP Location: Right Arm, Patient Position: Sitting, Cuff Size: Normal)   Pulse 64   Ht 5' (1.524 m)   Wt 144 lb (65.3 kg)   BMI 28.12 kg/m  Skin warm and dry.Pelvic: external genitalia is normal in appearance no lesions, vagina: ulcerations have resolved     Upstream - 07/04/20 0935      Pregnancy Intention Screening   Does the patient want to become pregnant in the next year? No    Does the patient's partner want to become pregnant in the next year? No    Would the patient like to discuss contraceptive options today? No      Contraception Wrap Up   Current Method Abstinence   hyst   End Method Abstinence   hyst   Contraception Counseling Provided No         Examination chaperoned by Lawanna Kobus RN  Assessment:     1. HSV-2 (herpes simplex virus 2) infection Has refill on valtrex if should return If has sex use condoms    Plan:     Follow up prn

## 2020-09-05 DIAGNOSIS — M4722 Other spondylosis with radiculopathy, cervical region: Secondary | ICD-10-CM | POA: Diagnosis not present

## 2020-09-05 DIAGNOSIS — M4726 Other spondylosis with radiculopathy, lumbar region: Secondary | ICD-10-CM | POA: Diagnosis not present

## 2020-09-05 DIAGNOSIS — M150 Primary generalized (osteo)arthritis: Secondary | ICD-10-CM | POA: Diagnosis not present

## 2020-09-05 DIAGNOSIS — Z79891 Long term (current) use of opiate analgesic: Secondary | ICD-10-CM | POA: Diagnosis not present

## 2020-09-05 DIAGNOSIS — G894 Chronic pain syndrome: Secondary | ICD-10-CM | POA: Diagnosis not present

## 2020-10-31 DIAGNOSIS — M4726 Other spondylosis with radiculopathy, lumbar region: Secondary | ICD-10-CM | POA: Diagnosis not present

## 2020-10-31 DIAGNOSIS — M150 Primary generalized (osteo)arthritis: Secondary | ICD-10-CM | POA: Diagnosis not present

## 2020-10-31 DIAGNOSIS — M4722 Other spondylosis with radiculopathy, cervical region: Secondary | ICD-10-CM | POA: Diagnosis not present

## 2020-10-31 DIAGNOSIS — G894 Chronic pain syndrome: Secondary | ICD-10-CM | POA: Diagnosis not present

## 2020-11-21 DIAGNOSIS — Z23 Encounter for immunization: Secondary | ICD-10-CM | POA: Diagnosis not present

## 2020-11-21 DIAGNOSIS — Z961 Presence of intraocular lens: Secondary | ICD-10-CM | POA: Diagnosis not present

## 2020-11-21 DIAGNOSIS — M797 Fibromyalgia: Secondary | ICD-10-CM | POA: Diagnosis not present

## 2020-11-21 DIAGNOSIS — I10 Essential (primary) hypertension: Secondary | ICD-10-CM | POA: Diagnosis not present

## 2020-11-21 DIAGNOSIS — E039 Hypothyroidism, unspecified: Secondary | ICD-10-CM | POA: Diagnosis not present

## 2020-11-21 DIAGNOSIS — J449 Chronic obstructive pulmonary disease, unspecified: Secondary | ICD-10-CM | POA: Diagnosis not present

## 2020-12-31 DIAGNOSIS — M4726 Other spondylosis with radiculopathy, lumbar region: Secondary | ICD-10-CM | POA: Diagnosis not present

## 2020-12-31 DIAGNOSIS — M4722 Other spondylosis with radiculopathy, cervical region: Secondary | ICD-10-CM | POA: Diagnosis not present

## 2020-12-31 DIAGNOSIS — G894 Chronic pain syndrome: Secondary | ICD-10-CM | POA: Diagnosis not present

## 2020-12-31 DIAGNOSIS — M150 Primary generalized (osteo)arthritis: Secondary | ICD-10-CM | POA: Diagnosis not present

## 2021-05-01 DIAGNOSIS — M4726 Other spondylosis with radiculopathy, lumbar region: Secondary | ICD-10-CM | POA: Diagnosis not present

## 2021-05-01 DIAGNOSIS — M150 Primary generalized (osteo)arthritis: Secondary | ICD-10-CM | POA: Diagnosis not present

## 2021-05-01 DIAGNOSIS — G894 Chronic pain syndrome: Secondary | ICD-10-CM | POA: Diagnosis not present

## 2021-05-01 DIAGNOSIS — M4722 Other spondylosis with radiculopathy, cervical region: Secondary | ICD-10-CM | POA: Diagnosis not present

## 2021-05-08 ENCOUNTER — Other Ambulatory Visit (HOSPITAL_COMMUNITY): Payer: Self-pay | Admitting: Family Medicine

## 2021-05-08 DIAGNOSIS — R7301 Impaired fasting glucose: Secondary | ICD-10-CM | POA: Diagnosis not present

## 2021-05-08 DIAGNOSIS — E559 Vitamin D deficiency, unspecified: Secondary | ICD-10-CM | POA: Diagnosis not present

## 2021-05-08 DIAGNOSIS — Z13820 Encounter for screening for osteoporosis: Secondary | ICD-10-CM | POA: Diagnosis not present

## 2021-05-08 DIAGNOSIS — Z139 Encounter for screening, unspecified: Secondary | ICD-10-CM | POA: Diagnosis not present

## 2021-05-08 DIAGNOSIS — K219 Gastro-esophageal reflux disease without esophagitis: Secondary | ICD-10-CM | POA: Diagnosis not present

## 2021-05-08 DIAGNOSIS — Z0189 Encounter for other specified special examinations: Secondary | ICD-10-CM | POA: Diagnosis not present

## 2021-05-08 DIAGNOSIS — I10 Essential (primary) hypertension: Secondary | ICD-10-CM | POA: Diagnosis not present

## 2021-05-08 DIAGNOSIS — M797 Fibromyalgia: Secondary | ICD-10-CM | POA: Diagnosis not present

## 2021-05-08 DIAGNOSIS — E039 Hypothyroidism, unspecified: Secondary | ICD-10-CM | POA: Diagnosis not present

## 2021-05-08 DIAGNOSIS — J449 Chronic obstructive pulmonary disease, unspecified: Secondary | ICD-10-CM | POA: Diagnosis not present

## 2021-05-08 DIAGNOSIS — G894 Chronic pain syndrome: Secondary | ICD-10-CM | POA: Diagnosis not present

## 2021-05-08 DIAGNOSIS — M545 Low back pain, unspecified: Secondary | ICD-10-CM | POA: Diagnosis not present

## 2021-05-15 DIAGNOSIS — R7303 Prediabetes: Secondary | ICD-10-CM | POA: Diagnosis not present

## 2021-05-15 DIAGNOSIS — Z0001 Encounter for general adult medical examination with abnormal findings: Secondary | ICD-10-CM | POA: Diagnosis not present

## 2021-05-15 DIAGNOSIS — E039 Hypothyroidism, unspecified: Secondary | ICD-10-CM | POA: Diagnosis not present

## 2021-05-15 DIAGNOSIS — E559 Vitamin D deficiency, unspecified: Secondary | ICD-10-CM | POA: Diagnosis not present

## 2021-05-15 DIAGNOSIS — M545 Low back pain, unspecified: Secondary | ICD-10-CM | POA: Diagnosis not present

## 2021-05-15 DIAGNOSIS — J449 Chronic obstructive pulmonary disease, unspecified: Secondary | ICD-10-CM | POA: Diagnosis not present

## 2021-05-15 DIAGNOSIS — I10 Essential (primary) hypertension: Secondary | ICD-10-CM | POA: Diagnosis not present

## 2021-05-15 DIAGNOSIS — E785 Hyperlipidemia, unspecified: Secondary | ICD-10-CM | POA: Diagnosis not present

## 2021-06-11 ENCOUNTER — Other Ambulatory Visit (HOSPITAL_COMMUNITY): Payer: Self-pay | Admitting: Family Medicine

## 2021-06-11 DIAGNOSIS — Z1231 Encounter for screening mammogram for malignant neoplasm of breast: Secondary | ICD-10-CM

## 2021-06-19 ENCOUNTER — Ambulatory Visit (HOSPITAL_COMMUNITY)
Admission: RE | Admit: 2021-06-19 | Discharge: 2021-06-19 | Disposition: A | Discharge status: Home / Self Care | Payer: Medicare Other | Source: Ambulatory Visit | Attending: Family Medicine | Admitting: Family Medicine

## 2021-06-19 DIAGNOSIS — Z780 Asymptomatic menopausal state: Secondary | ICD-10-CM | POA: Diagnosis not present

## 2021-06-19 DIAGNOSIS — Z1231 Encounter for screening mammogram for malignant neoplasm of breast: Secondary | ICD-10-CM | POA: Insufficient documentation

## 2021-06-19 DIAGNOSIS — Z13820 Encounter for screening for osteoporosis: Secondary | ICD-10-CM | POA: Insufficient documentation

## 2021-06-19 DIAGNOSIS — M810 Age-related osteoporosis without current pathological fracture: Secondary | ICD-10-CM | POA: Diagnosis not present

## 2021-06-26 DIAGNOSIS — M4726 Other spondylosis with radiculopathy, lumbar region: Secondary | ICD-10-CM | POA: Diagnosis not present

## 2021-06-26 DIAGNOSIS — G894 Chronic pain syndrome: Secondary | ICD-10-CM | POA: Diagnosis not present

## 2021-06-26 DIAGNOSIS — M4722 Other spondylosis with radiculopathy, cervical region: Secondary | ICD-10-CM | POA: Diagnosis not present

## 2021-06-26 DIAGNOSIS — M150 Primary generalized (osteo)arthritis: Secondary | ICD-10-CM | POA: Diagnosis not present

## 2021-06-26 DIAGNOSIS — E039 Hypothyroidism, unspecified: Secondary | ICD-10-CM | POA: Diagnosis not present

## 2021-07-24 ENCOUNTER — Other Ambulatory Visit (HOSPITAL_COMMUNITY): Payer: Self-pay | Admitting: Anesthesiology

## 2021-07-24 ENCOUNTER — Ambulatory Visit (HOSPITAL_COMMUNITY)
Admission: RE | Admit: 2021-07-24 | Discharge: 2021-07-24 | Disposition: A | Discharge status: Home / Self Care | Payer: Medicare Other | Source: Ambulatory Visit | Attending: Anesthesiology | Admitting: Anesthesiology

## 2021-07-24 DIAGNOSIS — M25552 Pain in left hip: Secondary | ICD-10-CM | POA: Insufficient documentation

## 2021-08-28 DIAGNOSIS — M4726 Other spondylosis with radiculopathy, lumbar region: Secondary | ICD-10-CM | POA: Diagnosis not present

## 2021-08-28 DIAGNOSIS — M150 Primary generalized (osteo)arthritis: Secondary | ICD-10-CM | POA: Diagnosis not present

## 2021-08-28 DIAGNOSIS — G894 Chronic pain syndrome: Secondary | ICD-10-CM | POA: Diagnosis not present

## 2021-08-28 DIAGNOSIS — M4722 Other spondylosis with radiculopathy, cervical region: Secondary | ICD-10-CM | POA: Diagnosis not present

## 2021-09-06 DIAGNOSIS — I10 Essential (primary) hypertension: Secondary | ICD-10-CM | POA: Diagnosis not present

## 2021-09-06 DIAGNOSIS — E559 Vitamin D deficiency, unspecified: Secondary | ICD-10-CM | POA: Diagnosis not present

## 2021-09-11 DIAGNOSIS — E785 Hyperlipidemia, unspecified: Secondary | ICD-10-CM | POA: Diagnosis not present

## 2021-09-11 DIAGNOSIS — E559 Vitamin D deficiency, unspecified: Secondary | ICD-10-CM | POA: Diagnosis not present

## 2021-09-11 DIAGNOSIS — M810 Age-related osteoporosis without current pathological fracture: Secondary | ICD-10-CM | POA: Diagnosis not present

## 2021-09-11 DIAGNOSIS — R7303 Prediabetes: Secondary | ICD-10-CM | POA: Diagnosis not present

## 2021-09-11 DIAGNOSIS — G894 Chronic pain syndrome: Secondary | ICD-10-CM | POA: Diagnosis not present

## 2021-09-11 DIAGNOSIS — I10 Essential (primary) hypertension: Secondary | ICD-10-CM | POA: Diagnosis not present

## 2021-09-11 DIAGNOSIS — J449 Chronic obstructive pulmonary disease, unspecified: Secondary | ICD-10-CM | POA: Diagnosis not present

## 2021-09-11 DIAGNOSIS — R945 Abnormal results of liver function studies: Secondary | ICD-10-CM | POA: Diagnosis not present

## 2021-09-11 DIAGNOSIS — M1612 Unilateral primary osteoarthritis, left hip: Secondary | ICD-10-CM | POA: Diagnosis not present

## 2021-09-11 DIAGNOSIS — E039 Hypothyroidism, unspecified: Secondary | ICD-10-CM | POA: Diagnosis not present

## 2021-09-11 DIAGNOSIS — M797 Fibromyalgia: Secondary | ICD-10-CM | POA: Diagnosis not present

## 2021-09-11 DIAGNOSIS — M545 Low back pain, unspecified: Secondary | ICD-10-CM | POA: Diagnosis not present

## 2021-10-30 DIAGNOSIS — M4722 Other spondylosis with radiculopathy, cervical region: Secondary | ICD-10-CM | POA: Diagnosis not present

## 2021-10-30 DIAGNOSIS — G894 Chronic pain syndrome: Secondary | ICD-10-CM | POA: Diagnosis not present

## 2021-10-30 DIAGNOSIS — Z79891 Long term (current) use of opiate analgesic: Secondary | ICD-10-CM | POA: Diagnosis not present

## 2021-10-30 DIAGNOSIS — M150 Primary generalized (osteo)arthritis: Secondary | ICD-10-CM | POA: Diagnosis not present

## 2021-10-30 DIAGNOSIS — M4726 Other spondylosis with radiculopathy, lumbar region: Secondary | ICD-10-CM | POA: Diagnosis not present

## 2021-11-27 DIAGNOSIS — L299 Pruritus, unspecified: Secondary | ICD-10-CM | POA: Diagnosis not present

## 2021-12-11 DIAGNOSIS — I10 Essential (primary) hypertension: Secondary | ICD-10-CM | POA: Diagnosis not present

## 2021-12-11 DIAGNOSIS — E039 Hypothyroidism, unspecified: Secondary | ICD-10-CM | POA: Diagnosis not present

## 2021-12-11 DIAGNOSIS — E559 Vitamin D deficiency, unspecified: Secondary | ICD-10-CM | POA: Diagnosis not present

## 2021-12-11 DIAGNOSIS — R7303 Prediabetes: Secondary | ICD-10-CM | POA: Diagnosis not present

## 2021-12-18 DIAGNOSIS — M810 Age-related osteoporosis without current pathological fracture: Secondary | ICD-10-CM | POA: Diagnosis not present

## 2021-12-18 DIAGNOSIS — M1612 Unilateral primary osteoarthritis, left hip: Secondary | ICD-10-CM | POA: Diagnosis not present

## 2021-12-18 DIAGNOSIS — Z23 Encounter for immunization: Secondary | ICD-10-CM | POA: Diagnosis not present

## 2021-12-18 DIAGNOSIS — I10 Essential (primary) hypertension: Secondary | ICD-10-CM | POA: Diagnosis not present

## 2021-12-18 DIAGNOSIS — M545 Low back pain, unspecified: Secondary | ICD-10-CM | POA: Diagnosis not present

## 2021-12-18 DIAGNOSIS — R7303 Prediabetes: Secondary | ICD-10-CM | POA: Diagnosis not present

## 2021-12-18 DIAGNOSIS — G894 Chronic pain syndrome: Secondary | ICD-10-CM | POA: Diagnosis not present

## 2021-12-18 DIAGNOSIS — J449 Chronic obstructive pulmonary disease, unspecified: Secondary | ICD-10-CM | POA: Diagnosis not present

## 2021-12-18 DIAGNOSIS — E039 Hypothyroidism, unspecified: Secondary | ICD-10-CM | POA: Diagnosis not present

## 2021-12-18 DIAGNOSIS — E559 Vitamin D deficiency, unspecified: Secondary | ICD-10-CM | POA: Diagnosis not present

## 2021-12-18 DIAGNOSIS — E785 Hyperlipidemia, unspecified: Secondary | ICD-10-CM | POA: Diagnosis not present

## 2021-12-18 DIAGNOSIS — M797 Fibromyalgia: Secondary | ICD-10-CM | POA: Diagnosis not present

## 2022-01-01 DIAGNOSIS — M150 Primary generalized (osteo)arthritis: Secondary | ICD-10-CM | POA: Diagnosis not present

## 2022-01-01 DIAGNOSIS — G894 Chronic pain syndrome: Secondary | ICD-10-CM | POA: Diagnosis not present

## 2022-01-01 DIAGNOSIS — M4726 Other spondylosis with radiculopathy, lumbar region: Secondary | ICD-10-CM | POA: Diagnosis not present

## 2022-01-01 DIAGNOSIS — M4722 Other spondylosis with radiculopathy, cervical region: Secondary | ICD-10-CM | POA: Diagnosis not present
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
## 2022-03-05 ENCOUNTER — Ambulatory Visit (INDEPENDENT_AMBULATORY_CARE_PROVIDER_SITE_OTHER): Payer: Medicare Other

## 2022-03-05 ENCOUNTER — Ambulatory Visit: Payer: Medicare Other

## 2022-03-05 ENCOUNTER — Ambulatory Visit: Payer: Medicare Other | Attending: Physician Assistant | Admitting: Physician Assistant

## 2022-03-05 ENCOUNTER — Encounter: Payer: Self-pay | Admitting: Physician Assistant

## 2022-03-05 VITALS — BP 134/70 | HR 78 | Resp 17 | Ht 60.75 in | Wt 143.4 lb

## 2022-03-05 DIAGNOSIS — M79671 Pain in right foot: Secondary | ICD-10-CM

## 2022-03-05 DIAGNOSIS — R7303 Prediabetes: Secondary | ICD-10-CM

## 2022-03-05 DIAGNOSIS — M25561 Pain in right knee: Secondary | ICD-10-CM | POA: Diagnosis not present

## 2022-03-05 DIAGNOSIS — M79642 Pain in left hand: Secondary | ICD-10-CM | POA: Diagnosis not present

## 2022-03-05 DIAGNOSIS — M25562 Pain in left knee: Secondary | ICD-10-CM

## 2022-03-05 DIAGNOSIS — Z8639 Personal history of other endocrine, nutritional and metabolic disease: Secondary | ICD-10-CM

## 2022-03-05 DIAGNOSIS — M79672 Pain in left foot: Secondary | ICD-10-CM

## 2022-03-05 DIAGNOSIS — M2550 Pain in unspecified joint: Secondary | ICD-10-CM

## 2022-03-05 DIAGNOSIS — G8929 Other chronic pain: Secondary | ICD-10-CM | POA: Diagnosis not present

## 2022-03-05 DIAGNOSIS — R5383 Other fatigue: Secondary | ICD-10-CM

## 2022-03-05 DIAGNOSIS — Z8709 Personal history of other diseases of the respiratory system: Secondary | ICD-10-CM

## 2022-03-05 DIAGNOSIS — M797 Fibromyalgia: Secondary | ICD-10-CM

## 2022-03-05 DIAGNOSIS — G5603 Carpal tunnel syndrome, bilateral upper limbs: Secondary | ICD-10-CM

## 2022-03-05 DIAGNOSIS — Z8739 Personal history of other diseases of the musculoskeletal system and connective tissue: Secondary | ICD-10-CM

## 2022-03-05 DIAGNOSIS — M1612 Unilateral primary osteoarthritis, left hip: Secondary | ICD-10-CM

## 2022-03-05 DIAGNOSIS — I10 Essential (primary) hypertension: Secondary | ICD-10-CM

## 2022-03-05 DIAGNOSIS — M791 Myalgia, unspecified site: Secondary | ICD-10-CM

## 2022-03-05 DIAGNOSIS — E559 Vitamin D deficiency, unspecified: Secondary | ICD-10-CM

## 2022-03-05 DIAGNOSIS — M79641 Pain in right hand: Secondary | ICD-10-CM

## 2022-03-05 DIAGNOSIS — G894 Chronic pain syndrome: Secondary | ICD-10-CM

## 2022-03-07 LAB — CBC WITH DIFFERENTIAL/PLATELET
Absolute Monocytes: 329 cells/uL (ref 200–950)
Basophils Absolute: 38 cells/uL (ref 0–200)
Basophils Relative: 0.4 %
Eosinophils Absolute: 103 cells/uL (ref 15–500)
Eosinophils Relative: 1.1 %
HCT: 41.4 % (ref 35.0–45.0)
Hemoglobin: 13.3 g/dL (ref 11.7–15.5)
Lymphs Abs: 1175 cells/uL (ref 850–3900)
MCH: 28 pg (ref 27.0–33.0)
MCHC: 32.1 g/dL (ref 32.0–36.0)
MCV: 87.2 fL (ref 80.0–100.0)
MPV: 10.5 fL (ref 7.5–12.5)
Monocytes Relative: 3.5 %
Neutro Abs: 7755 cells/uL (ref 1500–7800)
Neutrophils Relative %: 82.5 %
Platelets: 327 10*3/uL (ref 140–400)
RBC: 4.75 10*6/uL (ref 3.80–5.10)
RDW: 13.6 % (ref 11.0–15.0)
Total Lymphocyte: 12.5 %
WBC: 9.4 10*3/uL (ref 3.8–10.8)

## 2022-03-07 LAB — RNP ANTIBODY: Ribonucleic Protein(ENA) Antibody, IgG: <1 AI

## 2022-03-07 LAB — RHEUMATOID FACTOR: Rheumatoid fact SerPl-aCnc: 27 IU/mL — ABNORMAL HIGH (ref <14)

## 2022-03-07 LAB — COMPLETE METABOLIC PANEL WITH GFR
AG Ratio: 1.3 (calc) (ref 1.0–2.5)
ALT: 19 U/L (ref 6–29)
AST: 20 U/L (ref 10–35)
Albumin: 3.6 g/dL (ref 3.6–5.1)
Alkaline phosphatase (APISO): 64 U/L (ref 37–153)
BUN: 12 mg/dL (ref 7–25)
CO2: 32 mmol/L (ref 20–32)
Calcium: 8.3 mg/dL — ABNORMAL LOW (ref 8.6–10.4)
Chloride: 103 mmol/L (ref 98–110)
Creat: 0.65 mg/dL (ref 0.50–1.05)
Globulin: 2.8 g/dL (calc) (ref 1.9–3.7)
Glucose, Bld: 52 mg/dL — ABNORMAL LOW (ref 65–99)
Potassium: 4.7 mmol/L (ref 3.5–5.3)
Sodium: 142 mmol/L (ref 135–146)
Total Bilirubin: 0.3 mg/dL (ref 0.2–1.2)
Total Protein: 6.4 g/dL (ref 6.1–8.1)
eGFR: 96 mL/min/{1.73_m2} (ref >=60)

## 2022-03-07 LAB — ANTI-NUCLEAR AB-TITER (ANA TITER)
ANA TITER: 1:320 {titer} — ABNORMAL HIGH
ANA Titer 1: 1:40 {titer} — ABNORMAL HIGH

## 2022-03-07 LAB — ANTI-SMITH ANTIBODY: ENA SM Ab Ser-aCnc: <1 AI

## 2022-03-07 LAB — ANTI-SCLERODERMA ANTIBODY: Scleroderma (Scl-70) (ENA) Antibody, IgG: <1 AI

## 2022-03-07 LAB — SJOGRENS SYNDROME-A EXTRACTABLE NUCLEAR ANTIBODY: SSA (Ro) (ENA) Antibody, IgG: <1 AI

## 2022-03-07 LAB — C3 AND C4
C3 Complement: 130 mg/dL (ref 83–193)
C4 Complement: 24 mg/dL (ref 15–57)

## 2022-03-07 LAB — CYCLIC CITRUL PEPTIDE ANTIBODY, IGG: Cyclic Citrullin Peptide Ab: <16 UNITS

## 2022-03-07 LAB — SEDIMENTATION RATE: Sed Rate: 36 mm/h — ABNORMAL HIGH (ref 0–30)

## 2022-03-07 LAB — ANTI-DNA ANTIBODY, DOUBLE-STRANDED: ds DNA Ab: 1 IU/mL

## 2022-03-07 LAB — SJOGRENS SYNDROME-B EXTRACTABLE NUCLEAR ANTIBODY: SSB (La) (ENA) Antibody, IgG: <1 AI

## 2022-03-07 LAB — C-REACTIVE PROTEIN: CRP: 3.9 mg/L (ref <8.0)

## 2022-03-07 LAB — ANA: Anti Nuclear Antibody (ANA): POSITIVE — AB

## 2022-03-07 LAB — CK: Total CK: 79 U/L (ref 29–143)

## 2022-03-07 NOTE — Progress Notes (Signed)
Lab work will be discussed at new patient follow-up visit.

## 2022-03-24 NOTE — Progress Notes (Signed)
Office Visit Note  Patient: Dawn Hughes             Date of Birth: 1956-02-10           MRN: [id number]             PCP: London Pepper, MD Referring: London Pepper, MD Visit Date: 04/01/2022 Occupation: @GUAROCC @  Subjective:  Discuss lab work and x-ray results  History of Present Illness: Dawn Hughes is a 67 y.o. female with history of osteoarthritis, chondrocalcinosis, and positive ANA.  Patient presents today to discuss lab results and X-ray results from her initial office visit.   Patient continues to have ongoing pain in both hands, left knee, and left foot.  She has noticed intermittent swelling in both hands.  She has been using arthritis compression gloves to help try to minimize her symptoms.  She denies any new medical conditions since her last office visit.    Activities of Daily Living:  Patient reports morning stiffness for 1 hour.   Patient Reports nocturnal pain.  Difficulty dressing/grooming: Reports Difficulty climbing stairs: Reports Difficulty getting out of chair: Reports Difficulty using hands for taps, buttons, cutlery, and/or writing: Reports  Review of Systems  Constitutional:  Positive for fatigue.  HENT:  Positive for mouth sores and mouth dryness. Negative for nose dryness.   Eyes:  Positive for dryness. Negative for pain and visual disturbance.  Respiratory:  Negative for cough, hemoptysis and difficulty breathing.   Cardiovascular:  Negative for chest pain, palpitations, hypertension and swelling in legs/feet.  Gastrointestinal:  Negative for blood in stool, constipation and diarrhea.  Endocrine: Positive for increased urination.  Genitourinary:  Positive for involuntary urination. Negative for painful urination.  Musculoskeletal:  Positive for joint pain, gait problem, joint pain, joint swelling, myalgias, muscle weakness, morning stiffness, muscle tenderness and myalgias.  Skin:  Positive for rash. Negative for color change, pallor, hair loss,  nodules/bumps, skin tightness, ulcers and sensitivity to sunlight.  Allergic/Immunologic: Negative.  Negative for susceptible to infections.  Neurological:  Positive for dizziness and headaches. Negative for numbness and weakness.  Hematological:  Positive for swollen glands.  Psychiatric/Behavioral:  Positive for depressed mood and sleep disturbance. The patient is nervous/anxious.     PMFS History:  Patient Active Problem List   Diagnosis Date Noted   Vulvar ulceration 06/18/2020   Itching in the vaginal area 06/18/2020    Past Medical History:  Diagnosis Date   COPD (chronic obstructive pulmonary disease) (HCC)    Fibromyalgia    GAD (generalized anxiety disorder)    Hypertension    Hypothyroidism    Other intervertebral disc degeneration, lumbar region     Family History  Problem Relation Age of Onset   Diabetes Mother    Heart disease Mother    Diabetes Father    Breast cancer Paternal Grandmother    Asthma Daughter    Fibroids Daughter    Rheum arthritis Daughter    Cancer Daughter    Asthma Son    Psoriasis Son    Past Surgical History:  Procedure Laterality Date   CHOLECYSTECTOMY     DILATION AND CURETTAGE OF UTERUS     LAPAROSCOPIC ASSISTED VAGINAL HYSTERECTOMY     SPINAL CORD STIMULATOR INSERTION     stomach stapling  1980   Social History   Social History Narrative   Not on file    There is no immunization history on file for this patient.   Objective: Vital Signs: BP 99/66 (BP Location:  Left Arm, Patient Position: Sitting, Cuff Size: Normal)   Pulse 79   Ht 5' 0.75" (1.543 m)   Wt 140 lb 9.6 oz (63.8 kg)   BMI 26.79 kg/m    Physical Exam Vitals and nursing note reviewed.  Constitutional:      Appearance: She is well-developed.  HENT:     Head: Normocephalic and atraumatic.  Eyes:     Conjunctiva/sclera: Conjunctivae normal.  Cardiovascular:     Rate and Rhythm: Normal rate and regular rhythm.     Heart sounds: Normal heart sounds.   Pulmonary:     Effort: Pulmonary effort is normal.     Breath sounds: Normal breath sounds.  Abdominal:     General: Bowel sounds are normal.     Palpations: Abdomen is soft.  Musculoskeletal:     Cervical back: Normal range of motion.  Skin:    General: Skin is warm and dry.     Capillary Refill: Capillary refill takes less than 2 seconds.  Neurological:     Mental Status: She is alert and oriented to person, place, and time.  Psychiatric:        Behavior: Behavior normal.      Musculoskeletal Exam: C-spine has limited range of motion with lateral rotation.  Painful range of motion of the lumbar spine.  Shoulder joints, elbow joints, wrist joints, MCPs, PIPs, DIPs have good range of motion with no synovitis.  Complete fist formation bilaterally.  PIP and DIP thickening consistent with osteoarthritis of both hands.  Tenderness over several MCP joints but no obvious synovitis was noted.  Tenderness over both wrist joints noted.  Hip joints have good range of motion with some discomfort in the left hip.  Both knee joints have good range of motion with crepitus but no warmth or effusion.  Ankle joints have good range of motion with no tenderness or synovitis.  PIP and DIP thickening consistent with osteoarthritis of both feet.  CDAI Exam: CDAI Score: 13  Patient Global: 5 mm; Provider Global: 5 mm Swollen: 0 ; Tender: 18  Joint Exam 04/01/2022      Right  Left  Wrist   Tender   Tender  MCP 1      Tender  MCP 2   Tender   Tender  MCP 3   Tender   Tender  MCP 4   Tender   Tender  MCP 5   Tender   Tender  Knee      Tender  Tarsometatarsal      Tender  MTP 1   Tender   Tender  MTP 2   Tender   Tender  MTP 5      Tender     Investigation: No additional findings.  Imaging: XR KNEE 3 VIEW LEFT  Result Date: 03/05/2022 No medial or lateral compartment narrowing was noted.  Mild patellofemoral narrowing was noted.  Chondrocalcinosis was noted. Impression: Mild chondromalacia  patella and chondrocalcinosis was noted.  XR KNEE 3 VIEW RIGHT  Result Date: 03/05/2022 No medial lateral compartment narrowing was noted.  Mild patellofemoral narrowing was noted.  Chondrocalcinosis was noted. Impression: Mild chondromalacia patella and chondrocalcinosis was noted.  XR Foot 2 Views Left  Result Date: 03/05/2022 First MTP, PIP and DIP narrowing was noted.  Dorsal spurring was noted.  No significant intertarsal, tibiotalar or subtalar joint space narrowing was noted.  No erosive changes were noted.  Inferior and posterior calcaneal spurs were noted. Impression: These findings are consistent with osteoarthritis  of the foot.  XR Foot 2 Views Right  Result Date: 03/05/2022 First MTP, PIP and DIP narrowing was noted.  No intertarsal, tibiotalar or subtalar joint space narrowing was noted.  Inferior calcaneal spur was noted.  No erosive changes were noted. Impression: These findings are consistent with osteoarthritis of the foot.  XR Hand 2 View Left  Result Date: 03/05/2022 CMC, PIP and DIP narrowing was noted.  No MCP, intercarpal or radiocarpal joint space narrowing was noted.  Notable changes were noted. Impression: These findings are consistent with osteoarthritis of the hand.  XR Hand 2 View Right  Result Date: 03/05/2022 CMC, PIP and DIP narrowing was noted.  No MCP, intercarpal or radiocarpal joint space narrowing was noted.  Notable changes were noted. Impression: These findings are consistent with osteoarthritis of the hand.   Recent Labs: Lab Results  Component Value Date   WBC 9.4 03/05/2022   HGB 13.3 03/05/2022   PLT 327 03/05/2022   NA 142 03/05/2022   K 4.7 03/05/2022   CL 103 03/05/2022   CO2 32 03/05/2022   GLUCOSE 52 (L) 03/05/2022   BUN 12 03/05/2022   CREATININE 0.65 03/05/2022   BILITOT 0.3 03/05/2022   AST 20 03/05/2022   ALT 19 03/05/2022   PROT 6.4 03/05/2022   CALCIUM 8.3 (L) 03/05/2022    Speciality Comments: No specialty comments  available.  Procedures:  No procedures performed Allergies: Penicillin g   Assessment / Plan:     Visit Diagnoses: Rheumatoid arthritis with rheumatoid factor of multiple sites without organ or systems involvement [facility]): Rheumatoid factor was 27, anti-CCP negative, ESR 36, CRP within normal limits on 03/05/2022.  Reviewed x-ray results and lab results from her initial office visit on 03/05/2022 today in detail.  X-rays of both hands and feet were consistent with osteoarthritic changes.  No erosive changes were noted.  On examination she continues to have persistent tenderness over several MCP joints and several MTP joints.  Discussed proceeding with an ultrasound versus initiating a trial of Plaquenil.  Due to the severity and chronicity of her joint pain as well as positive rheumatoid factor and elevated sed rate we plan to initiate a trial of Plaquenil.  Indications, contraindications, and potential side effects of Plaquenil were discussed today in detail.  She will take Plaquenil 200 mg 1 tablet by mouth twice daily Monday through Friday.  She was advised to notify us if she cannot tolerate taking Plaquenil.  She was advised to schedule a baseline Plaquenil eye examination.  She will return for lab work in 1 month, 3 months then every 5 months.  Standing orders for CBC and CMP were placed today.  She will follow-up in the office in 6 to 8 weeks or sooner if needed.  Patient was counseled on the purpose, proper use, and adverse effects of hydroxychloroquine including nausea/diarrhea, skin rash, headaches, and sun sensitivity.  Advised patient to wear sunscreen once starting hydroxychloroquine to reduce risk of rash associated with sun sensitivity.  Discussed importance of annual eye exams while on hydroxychloroquine to monitor to ocular toxicity and discussed importance of frequent laboratory monitoring.  Provided patient with eye exam form for baseline ophthalmologic exam.  Reviewed risk for QTC  prolongation when used in combination with other QTc prolonging agents (including but not limited to antiarrhythmics, macrolide antibiotics, flouroquinolones, tricyclic antidepressants, citalopram, specific antipsychotics, ondansetron, migraine triptans, and methadone). Provided patient with educational materials on hydroxychloroquine and answered all questions.  Patient consented to hydroxychloroquine. Will upload  consent in the media tab.    Dose will be Plaquenil 200 mg twice daily Monday through Friday.  Prescription pending lab results.  High risk medication use: Plan to initiate a trial of Plaquenil 200 mg 1 tablet by mouth twice daily Monday through Friday. Recommend updating lab work in 1 month, 3 months, then every 5 months to monitor for drug toxicity. Encourage patient to schedule a baseline Plaquenil eye examination.  She was given a Plaquenil eye examination form to take with her to her upcoming appointment.  Primary osteoarthritis of both hands - 03/05/22: ESR 36, CRP WNL, RF 27, anti-CCP negative.  She has PIP and DIP thickening consistent with osteoarthritis of both hands.  Tenderness over several MCP joints as described above.  Plan to initiate a trial of Plaquenil.    Primary osteoarthritis of left hip: No groin pain currently.  Chondrocalcinosis - XR 03/05/22: Mild chondromalacia patella and chondrocalcinosis.  Reviewed x-ray results with the patient today in the office.  She continues to have persistent pain in the left knee joint.  No warmth or effusion noted on examination today.  Chondromalacia of both patellae: Mild chondromalacia patella in both knees.  Crepitus but no warmth or effusion noted today.  Primary osteoarthritis of both feet: X-rays of both feet on 03/05/2022 were consistent with osteoarthritis.  No erosive changes were noted.  X-ray results were discussed with the patient today in detail.  She continues to have chronic pain in the left foot.  Discussed the  importance of wearing proper fitting shoes.  Plan to initiate a trial of Plaquenil.   Positive ANA (antinuclear antibody) - 03/05/22: ANA 1:40 cytoplasmic, 1:320Nuclear, nucleolar.  Double-stranded ENA negative, complements within normal limits, Smith antibody negative.  Results were discussed today in detail.  She has no clinical features of systemic lupus at this time.  She was advised to notify us if she develops any new or worsening symptoms.  Fibromyalgia: Under the care of Dr. Vear Clock. She remains on Cymbalta 60 mg 1 capsule by mouth daily, Lyrica 300 mg twice daily, and tizanidine 4 mg 3 times daily for muscle spasms.  Bilateral carpal tunnel syndrome: Currently asymptomatic.  Chronic pain syndrome: She remains on Lyrica, Cymbalta, and tizanidine as prescribed.She also takes morphine 30 mg every 12 hours for pain relief.   History of osteoporosis: No DEXA in epic to review.  She is taking Fosamax 70 mg 1 tablet by mouth once weekly.  Vitamin D deficiency: She continues to take vitamin D 50,000 units once weekly.  Other medical conditions are listed as follows:   History of hyperlipidemia  History of hypothyroidism  Essential hypertension: BP was 99/66 today in the office.   History of COPD  Prediabetes  Orders: Orders Placed This Encounter  Procedures   CBC with Differential/Platelet   COMPLETE METABOLIC PANEL WITH GFR   Meds ordered this encounter  Medications   hydroxychloroquine (PLAQUENIL) 200 MG tablet    Sig: Take 1 tablet by mouth twice daily Monday trough Friday only. DO NOT TAKE ON SATURDAY AND SUNDAY    Dispense:  120 tablet    Refill:  0      Follow-Up Instructions: Return in about 6 weeks (around 05/13/2022).   Gearldine Bienenstock, PA-C  Note - This record has been created using Dragon software.  Chart creation errors have been sought, but may not always  have been located. Such creation errors do not reflect on  the standard of medical care.

## 2022-03-26 DIAGNOSIS — E039 Hypothyroidism, unspecified: Secondary | ICD-10-CM | POA: Diagnosis not present

## 2022-03-26 DIAGNOSIS — E559 Vitamin D deficiency, unspecified: Secondary | ICD-10-CM | POA: Diagnosis not present

## 2022-03-26 DIAGNOSIS — R7303 Prediabetes: Secondary | ICD-10-CM | POA: Diagnosis not present

## 2022-03-26 DIAGNOSIS — I10 Essential (primary) hypertension: Secondary | ICD-10-CM | POA: Diagnosis not present

## 2022-04-01 ENCOUNTER — Ambulatory Visit: Payer: Medicare Other | Attending: Physician Assistant | Admitting: Physician Assistant

## 2022-04-01 ENCOUNTER — Encounter: Payer: Self-pay | Admitting: Physician Assistant

## 2022-04-01 VITALS — BP 99/66 | HR 79 | Ht 60.75 in | Wt 140.6 lb

## 2022-04-01 DIAGNOSIS — Z8709 Personal history of other diseases of the respiratory system: Secondary | ICD-10-CM

## 2022-04-01 DIAGNOSIS — Z79899 Other long term (current) drug therapy: Secondary | ICD-10-CM

## 2022-04-01 DIAGNOSIS — M19072 Primary osteoarthritis, left ankle and foot: Secondary | ICD-10-CM

## 2022-04-01 DIAGNOSIS — M0579 Rheumatoid arthritis with rheumatoid factor of multiple sites without organ or systems involvement: Secondary | ICD-10-CM

## 2022-04-01 DIAGNOSIS — G894 Chronic pain syndrome: Secondary | ICD-10-CM

## 2022-04-01 DIAGNOSIS — M1612 Unilateral primary osteoarthritis, left hip: Secondary | ICD-10-CM | POA: Diagnosis not present

## 2022-04-01 DIAGNOSIS — M19041 Primary osteoarthritis, right hand: Secondary | ICD-10-CM

## 2022-04-01 DIAGNOSIS — Z8639 Personal history of other endocrine, nutritional and metabolic disease: Secondary | ICD-10-CM

## 2022-04-01 DIAGNOSIS — Z8739 Personal history of other diseases of the musculoskeletal system and connective tissue: Secondary | ICD-10-CM

## 2022-04-01 DIAGNOSIS — M2241 Chondromalacia patellae, right knee: Secondary | ICD-10-CM

## 2022-04-01 DIAGNOSIS — I10 Essential (primary) hypertension: Secondary | ICD-10-CM

## 2022-04-01 DIAGNOSIS — G5603 Carpal tunnel syndrome, bilateral upper limbs: Secondary | ICD-10-CM

## 2022-04-01 DIAGNOSIS — M797 Fibromyalgia: Secondary | ICD-10-CM

## 2022-04-01 DIAGNOSIS — R768 Other specified abnormal immunological findings in serum: Secondary | ICD-10-CM

## 2022-04-01 DIAGNOSIS — M19042 Primary osteoarthritis, left hand: Secondary | ICD-10-CM

## 2022-04-01 DIAGNOSIS — M19071 Primary osteoarthritis, right ankle and foot: Secondary | ICD-10-CM

## 2022-04-01 DIAGNOSIS — R7303 Prediabetes: Secondary | ICD-10-CM

## 2022-04-01 DIAGNOSIS — M2242 Chondromalacia patellae, left knee: Secondary | ICD-10-CM

## 2022-04-01 DIAGNOSIS — M1120 Other chondrocalcinosis, unspecified site: Secondary | ICD-10-CM

## 2022-04-01 DIAGNOSIS — E559 Vitamin D deficiency, unspecified: Secondary | ICD-10-CM

## 2022-04-01 MED ORDER — HYDROXYCHLOROQUINE SULFATE 200 MG PO TABS: ORAL_TABLET | ORAL | 0 refills | Status: DC

## 2022-04-01 NOTE — Patient Instructions (Signed)
Standing Labs We placed an order today for your standing lab work.   Please have your standing labs drawn in 1 months, 3 months, every 5 months   Please have your labs drawn 2 weeks prior to your appointment so that the provider can discuss your lab results at your appointment.  Please note that you may see your imaging and lab results in [place] before we have reviewed them. We will contact you once all results are reviewed. Please allow our office up to 72 hours to thoroughly review all of the results before contacting the office for clarification of your results.  Lab hours are:   Monday through Thursday from 8:00 am -12:30 pm and 1:00 pm-5:00 pm and Friday from 8:00 am-12:00 pm.  Please be advised, all patients with office appointments requiring lab work will take precedent over walk-in lab work.   Labs are drawn by [facility]. Please bring your co-pay at the time of your lab draw.  You may receive a bill from [facility] for your lab work.  Please note if you are on Hydroxychloroquine and and an order has been placed for a Hydroxychloroquine level, you will need to have it drawn 4 hours or more after your last dose.  If you wish to have your labs drawn at another location, please call the office 24 hours in advance so we can fax the orders.  The office is located at [address], [place], [place], [place] [address] No appointment is necessary.    If you have any questions regarding directions or hours of operation,  please call [phone].   As a reminder, please drink plenty of water prior to coming for your lab work. Thanks!   Hydroxychloroquine Tablets What is this medication? HYDROXYCHLOROQUINE (hye drox ee KLOR oh kwin) treats autoimmune conditions, such as rheumatoid arthritis and lupus. It works by slowing down an overactive immune system. It may also be used to prevent and treat malaria. It works by killing the parasite that causes malaria. It belongs to a group of  medications called DMARDs. This medicine may be used for other purposes; ask your health care provider or pharmacist if you have questions. COMMON BRAND NAME(S): Plaquenil, Quineprox What should I tell my care team before I take this medication? They need to know if you have any of these conditions: Diabetes Eye disease, vision problems Frequently drink alcohol G6PD deficiency Heart disease Irregular heartbeat or rhythm Kidney disease Liver disease Porphyria Psoriasis An unusual or allergic reaction to hydroxychloroquine, other medications, foods, dyes, or preservatives Pregnant or trying to get pregnant Breastfeeding How should I use this medication? Take this medication by mouth with water. Take it as directed on the prescription label. Do not cut, crush, or chew this medication. Swallow the tablets whole. Take it with food. Do not take it more than directed. Take all of this medication unless your care team tells you to stop it early. Keep taking it even if you think you are better. Take products with antacids in them at a different time of day than this medication. Take this medication 4 hours before or 4 hours after antacids. Talk to your care team if you have questions. Talk to your care team about the use of this medication in children. While this medication may be prescribed for selected conditions, precautions do apply. Overdosage: If you think you have taken too much of this medicine contact a poison control center or emergency room at once. NOTE: This medicine is only for  you. Do not share this medicine with others. What if I miss a dose? If you miss a dose, take it as soon as you can. If it is almost time for your next dose, take only that dose. Do not take double or extra doses. What may interact with this medication? Do not take this medication with any of the following: Cisapride Dronedarone Pimozide Thioridazine This medication may also interact with the  following: Ampicillin Antacids Cimetidine Cyclosporine Digoxin Kaolin Medications for diabetes, such as insulin, glipizide, glyburide Medications for seizures, such as carbamazepine, phenobarbital, phenytoin Mefloquine Methotrexate Other medications that cause heart rhythm changes Praziquantel This list may not describe all possible interactions. Give your health care provider a list of all the medicines, herbs, non-prescription drugs, or dietary supplements you use. Also tell them if you smoke, drink alcohol, or use illegal drugs. Some items may interact with your medicine. What should I watch for while using this medication? Visit your care team for regular checks on your progress. Tell your care team if your symptoms do not start to get better or if they get worse. You may need blood work done while you are taking this medication. If you take other medications that can affect heart rhythm, you may need more testing. Talk to your care team if you have questions. Your vision may be tested before and during use of this medication. Tell your care team right away if you have any change in your eyesight. This medication may cause serious skin reactions. They can happen weeks to months after starting the medication. Contact your care team right away if you notice fevers or flu-like symptoms with a rash. The rash may be red or purple and then turn into blisters or peeling of the skin. Or, you might notice a red rash with swelling of the face, lips or lymph nodes in your neck or under your arms. If you or your family notice any changes in your behavior, such as new or worsening depression, thoughts of harming yourself, anxiety, or other unusual or disturbing thoughts, or memory loss, call your care team right away. What side effects may I notice from receiving this medication? Side effects that you should report to your care team as soon as possible: Allergic reactions--skin rash, itching, hives,  swelling of the face, lips, tongue, or throat Aplastic anemia--unusual weakness or fatigue, dizziness, headache, trouble breathing, increased bleeding or bruising Change in vision Heart rhythm changes--fast or irregular heartbeat, dizziness, feeling faint or lightheaded, chest pain, trouble breathing Infection--fever, chills, cough, or sore throat Low blood sugar (hypoglycemia)--tremors or shaking, anxiety, sweating, cold or clammy skin, confusion, dizziness, rapid heartbeat Muscle injury--unusual weakness or fatigue, muscle pain, dark yellow or brown urine, decrease in amount of urine Pain, tingling, or numbness in the hands or feet Rash, fever, and swollen lymph nodes Redness, blistering, peeling, or loosening of the skin, including inside the mouth Thoughts of suicide or self-harm, worsening mood, or feelings of depression Unusual bruising or bleeding Side effects that usually do not require medical attention (report to your care team if they continue or are bothersome): Diarrhea Headache Nausea Stomach pain Vomiting This list may not describe all possible side effects. Call your doctor for medical advice about side effects. You may report side effects to FDA at 1-800-FDA-1088. Where should I keep my medication? Keep out of the reach of children and pets. Store at room temperature up to 30 degrees C (86 degrees F). Protect from light. Get rid of any unused  medication after the expiration date. To get rid of medications that are no longer needed or have expired: Take the medication to a medication take-back program. Check with your pharmacy or law enforcement to find a location. If you cannot return the medication, check the label or package insert to see if the medication should be thrown out in the garbage or flushed down the toilet. If you are not sure, ask your care team. If it is safe to put it in the trash, empty the medication out of the container. Mix the medication with cat litter,  dirt, coffee grounds, or other unwanted substance. Seal the mixture in a bag or container. Put it in the trash. NOTE: This sheet is a summary. It may not cover all possible information. If you have questions about this medicine, talk to your doctor, pharmacist, or health care provider.  2023 Elsevier/Gold Standard (2004-04-23 00:00:00)

## 2022-04-01 NOTE — Progress Notes (Signed)
Pharmacy Note  Subjective: Patient presents today to [facility] for follow up office visit.   Patient seen by the pharmacist for counseling on hydroxychloroquine to rheumatoid arthritis.    Objective: CMP     Component Value Date/Time   NA 142 03/05/2022 1203   K 4.7 03/05/2022 1203   CL 103 03/05/2022 1203   CO2 32 03/05/2022 1203   GLUCOSE 52 (L) 03/05/2022 1203   BUN 12 03/05/2022 1203   CREATININE 0.65 03/05/2022 1203   CALCIUM 8.3 (L) 03/05/2022 1203   PROT 6.4 03/05/2022 1203   AST 20 03/05/2022 1203   ALT 19 03/05/2022 1203   BILITOT 0.3 03/05/2022 1203    CBC    Component Value Date/Time   WBC 9.4 03/05/2022 1203   RBC 4.75 03/05/2022 1203   HGB 13.3 03/05/2022 1203   HCT 41.4 03/05/2022 1203   PLT 327 03/05/2022 1203   MCV 87.2 03/05/2022 1203   MCH 28.0 03/05/2022 1203   MCHC 32.1 03/05/2022 1203   RDW 13.6 03/05/2022 1203   LYMPHSABS 1,175 03/05/2022 1203   EOSABS 103 03/05/2022 1203   BASOSABS 38 03/05/2022 1203    Assessment/Plan: Patient was counseled on the purpose, proper use, and adverse effects of hydroxychloroquine including nausea/diarrhea, skin rash, headaches, and sun sensitivity.  Advised patient to wear sunscreen once starting hydroxychloroquine to reduce risk of rash associated with sun sensitivity.  Discussed importance of annual eye exams while on hydroxychloroquine to monitor to ocular toxicity and discussed importance of frequent laboratory monitoring.  Provided patient with eye exam form for baseline ophthalmologic exam.  Reviewed risk for QTC prolongation when used in combination with other QTc prolonging agents (including but not limited to antiarrhythmics, macrolide antibiotics, flouroquinolones, tricyclic antidepressants, citalopram, specific antipsychotics, ondansetron, migraine triptans, and methadone). Provided patient with educational materials on hydroxychloroquine and answered all questions.  Patient consented to  hydroxychloroquine. Will upload consent in the media tab.    Dose will be Plaquenil 200 mg twice daily Monday through Friday.  Rx sent to pharmacy today  Knox Saliva, PharmD, MPH, BCPS, CPP Clinical Pharmacist (Rheumatology and Pulmonology)

## 2022-04-02 DIAGNOSIS — Z0001 Encounter for general adult medical examination with abnormal findings: Secondary | ICD-10-CM | POA: Diagnosis not present

## 2022-04-02 DIAGNOSIS — I10 Essential (primary) hypertension: Secondary | ICD-10-CM | POA: Diagnosis not present

## 2022-04-02 DIAGNOSIS — E039 Hypothyroidism, unspecified: Secondary | ICD-10-CM | POA: Diagnosis not present

## 2022-04-02 DIAGNOSIS — M2042 Other hammer toe(s) (acquired), left foot: Secondary | ICD-10-CM | POA: Diagnosis not present

## 2022-04-02 DIAGNOSIS — M797 Fibromyalgia: Secondary | ICD-10-CM | POA: Diagnosis not present

## 2022-04-02 DIAGNOSIS — E559 Vitamin D deficiency, unspecified: Secondary | ICD-10-CM | POA: Diagnosis not present

## 2022-04-02 DIAGNOSIS — K117 Disturbances of salivary secretion: Secondary | ICD-10-CM | POA: Diagnosis not present

## 2022-04-02 DIAGNOSIS — G894 Chronic pain syndrome: Secondary | ICD-10-CM | POA: Diagnosis not present

## 2022-04-08 DIAGNOSIS — Z79899 Other long term (current) drug therapy: Secondary | ICD-10-CM | POA: Diagnosis not present

## 2022-04-08 DIAGNOSIS — Z961 Presence of intraocular lens: Secondary | ICD-10-CM | POA: Diagnosis not present

## 2022-04-08 DIAGNOSIS — M0609 Rheumatoid arthritis without rheumatoid factor, multiple sites: Secondary | ICD-10-CM | POA: Diagnosis not present

## 2022-04-08 DIAGNOSIS — H524 Presbyopia: Secondary | ICD-10-CM | POA: Diagnosis not present

## 2022-04-08 DIAGNOSIS — H5202 Hypermetropia, left eye: Secondary | ICD-10-CM | POA: Diagnosis not present

## 2022-04-10 ENCOUNTER — Other Ambulatory Visit: Payer: Medicare Other | Admitting: Rheumatology

## 2022-04-21 DIAGNOSIS — M79675 Pain in left toe(s): Secondary | ICD-10-CM | POA: Diagnosis not present

## 2022-04-21 DIAGNOSIS — M2042 Other hammer toe(s) (acquired), left foot: Secondary | ICD-10-CM | POA: Diagnosis not present

## 2022-04-21 DIAGNOSIS — M2041 Other hammer toe(s) (acquired), right foot: Secondary | ICD-10-CM | POA: Diagnosis not present

## 2022-04-21 DIAGNOSIS — L851 Acquired keratosis [keratoderma] palmaris et plantaris: Secondary | ICD-10-CM | POA: Diagnosis not present

## 2022-04-23 DIAGNOSIS — M4722 Other spondylosis with radiculopathy, cervical region: Secondary | ICD-10-CM | POA: Diagnosis not present

## 2022-04-23 DIAGNOSIS — M4726 Other spondylosis with radiculopathy, lumbar region: Secondary | ICD-10-CM | POA: Diagnosis not present

## 2022-04-23 DIAGNOSIS — G894 Chronic pain syndrome: Secondary | ICD-10-CM | POA: Diagnosis not present

## 2022-04-23 DIAGNOSIS — M150 Primary generalized (osteo)arthritis: Secondary | ICD-10-CM | POA: Diagnosis not present

## 2022-04-29 NOTE — Progress Notes (Signed)
Office Visit Note  Patient: Dawn Hughes             Date of Birth: 1955-11-12           MRN: [id number]             PCP: Farris Has, MD Referring: Farris Has, MD Visit Date: 05/13/2022 Occupation: @GUAROCC @  Subjective:  Medication monitoring   History of Present Illness: Dawn Hughes is a 67 y.o. female with history of seropositive rheumatoid arthritis and osteoarthritis.  Patient is currently taking plaquenil 200 mg 1 tablet by mouth twice daily Monday through Friday. Plaquenil was added after her last office visit on 04/01/22.  She has been tolerating Plaquenil without any side effects.  She had a baseline Plaquenil eye examination performed on 04/08/2022.  She has noticed over a 50% improvement in her joint pain, stiffness, and swelling since initiating Plaquenil.  Her morning stiffness has resolved.  Her mobility has improved.  The discomfort in her left hip has started to improve.  She continues to have intermittent discomfort in her hands and knee joints which she attributes to weather changes.  She is willing to give Plaquenil more time and for [place] to reassess for the full efficacy at her follow-up visit.  Activities of Daily Living:  Patient reports morning stiffness for 0  none .   Patient Reports nocturnal pain.  Difficulty dressing/grooming: Denies Difficulty climbing stairs: Denies Difficulty getting out of chair: Reports Difficulty using hands for taps, buttons, cutlery, and/or writing: Reports  Review of Systems  Constitutional:  Positive for fatigue.  HENT:  Positive for mouth dryness. Negative for mouth sores.   Eyes:  Positive for dryness.  Respiratory:  Positive for shortness of breath.   Cardiovascular:  Negative for chest pain and palpitations.  Gastrointestinal:  Negative for blood in stool, constipation and diarrhea.  Endocrine: Positive for increased urination.  Genitourinary:  Positive for involuntary urination.  Musculoskeletal:  Positive for joint  swelling. Negative for joint pain, gait problem, joint pain, myalgias, muscle weakness, morning stiffness, muscle tenderness and myalgias.  Skin:  Positive for color change and hair loss. Negative for rash and sensitivity to sunlight.  Allergic/Immunologic: Negative for susceptible to infections.  Neurological:  Negative for dizziness and headaches.  Hematological:  Positive for swollen glands.  Psychiatric/Behavioral:  Positive for depressed mood. Negative for sleep disturbance. The patient is not nervous/anxious.     PMFS History:  Patient Active Problem List   Diagnosis Date Noted   Vulvar ulceration 06/18/2020   Itching in the vaginal area 06/18/2020    Past Medical History:  Diagnosis Date   COPD (chronic obstructive pulmonary disease) (HCC)    Fibromyalgia    GAD (generalized anxiety disorder)    Hypertension    Hypothyroidism    Other intervertebral disc degeneration, lumbar region     Family History  Problem Relation Age of Onset   Diabetes Mother    Heart disease Mother    Diabetes Father    Breast cancer Paternal Grandmother    Asthma Daughter    Fibroids Daughter    Rheum arthritis Daughter    Cancer Daughter    Asthma Son    Psoriasis Son    Past Surgical History:  Procedure Laterality Date   CHOLECYSTECTOMY     DILATION AND CURETTAGE OF UTERUS     LAPAROSCOPIC ASSISTED VAGINAL HYSTERECTOMY     SPINAL CORD STIMULATOR INSERTION     stomach stapling  1980   Social History  Social History Narrative   Not on file    There is no immunization history on file for this patient.   Objective: Vital Signs: BP 111/61 (BP Location: Left Arm, Patient Position: Sitting, Cuff Size: Normal)   Pulse 86   Resp 16   Ht 5\' 1"  (1.549 m)   Wt 145 lb (65.8 kg)   BMI 27.40 kg/m    Physical Exam Vitals and nursing note reviewed.  Constitutional:      Appearance: She is well-developed.  HENT:     Head: Normocephalic and atraumatic.  Eyes:     Conjunctiva/sclera:  Conjunctivae normal.  Cardiovascular:     Rate and Rhythm: Normal rate and regular rhythm.     Heart sounds: Normal heart sounds.  Pulmonary:     Effort: Pulmonary effort is normal.     Breath sounds: Normal breath sounds.  Abdominal:     General: Bowel sounds are normal.     Palpations: Abdomen is soft.  Musculoskeletal:     Cervical back: Normal range of motion.  Skin:    General: Skin is warm and dry.     Capillary Refill: Capillary refill takes less than 2 seconds.  Neurological:     Mental Status: She is alert and oriented to person, place, and time.  Psychiatric:        Behavior: Behavior normal.      Musculoskeletal Exam: C-spine has limited range of motion with lateral rotation.  Painful range of motion of the lumbar spine.  Shoulder joints, elbow joints, wrist joints, MCPs, PIPs, DIPs have good range of motion with no synovitis.  PIP and DIP thickening consistent with osteoarthritis of both hands.  Tenderness over the right second and third MCP joints.  Hip joints have good range of motion with some discomfort in the left hip.  Both knee joints have good range of motion with crepitus but no warmth or effusion.  Ankle joints have good range of motion with no tenderness or joint swelling.  CDAI Exam: CDAI Score: -- Patient Global: 7 mm; Provider Global: 5 mm Swollen: --; Tender: -- Joint Exam 05/13/2022   No joint exam has been documented for this visit   There is currently no information documented on the homunculus. Go to the Rheumatology activity and complete the homunculus joint exam.  Investigation: No additional findings.  Imaging: No results found.  Recent Labs: Lab Results  Component Value Date   WBC 5.2 05/09/2022   HGB 14.7 05/09/2022   PLT 198 05/09/2022   NA 138 05/09/2022   K 4.1 05/09/2022   CL 102 05/09/2022   CO2 27 05/09/2022   GLUCOSE 95 05/09/2022   BUN 17 05/09/2022   CREATININE 0.84 05/09/2022   BILITOT 0.7 05/09/2022   ALKPHOS 56  05/09/2022   AST 34 05/09/2022   ALT 30 05/09/2022   PROT 7.3 05/09/2022   ALBUMIN 4.1 05/09/2022   CALCIUM 9.3 05/09/2022    Speciality Comments: PLQ eye exam normal  04/08/2022 [facility] f/u 12 months  Procedures:  No procedures performed Allergies: Penicillin g    Assessment / Plan:     Visit Diagnoses: Rheumatoid arthritis with rheumatoid factor of multiple sites without organ or systems involvement (HCC) - : Rheumatoid factor was 27, anti-CCP negative, ESR 36, CRP within normal limits on 03/05/2022: Patient was started on Plaquenil 200 mg 1 tablet by mouth twice daily Monday through Friday after her last office visit at the beginning of February 2024.  She  has been tolerating Plaquenil without any side effects and has not missed any doses.  She had a baseline Plaquenil eye examination on 04/08/2022 which was normal.  She updated CBC and CMP on 05/09/2022 and results were discussed with the patient today in the office.  She has noticed about a 50% improvement in her symptoms since initiating Plaquenil.  Her morning stiffness has resolved.  Her mobility has started to improve.  She continues to experience intermittent pain and stiffness in both hands and both knees especially with cooler weather temperatures.  She is willing to give Plaquenil more time and for [place] to reassess for the full efficacy at her follow-up visit.  She will follow-up in the office in 3 months or sooner if needed.  High risk medication use - Plaquenil 200 mg 1 tablet by mouth BID M-F.  Plaquenil was initially started in the beginning of February 2024. CBC and CMP updated on 05/09/22.  Plan to update lab work in June and then every 5 months to monitor for drug toxicity.  Standing orders for CBC and CMP remain in place. PLQ eye exam normal 04/08/2022 [facility] f/u 12 months.   Primary osteoarthritis of both hands: She has PIP and DIP thickening consistent with osteoarthritis of both hands.   She has noticed less stiffness first thing in the mornings.  She has tenderness over the right second and third MCP joints but no synovitis was noted today.  Primary osteoarthritis of left hip: She has noticed improvement in the pain and stiffness in her left hip.  She continues to have slightly limited range of motion on examination.  Chondrocalcinosis - XR 03/05/22: Mild chondromalacia patella and chondrocalcinosis.  No knee warmth or effusion noted today.   Chondromalacia of both patellae: Good range of motion of both knee joints with crepitus bilaterally.  No warmth or effusion noted.  Primary osteoarthritis of both feet: No erosive changes noted.  She continues to have chronic pain in the left foot.  She is planning on proceeding with left foot surgery in the future.  Positive ANA (antinuclear antibody) - 03/05/22: ANA 1:40 cytoplasmic, 1:320Nuclear, nucleolar.  Double-stranded ENA negative, complements within normal limits, Smith antibody negative.  No clinical features of systemic lupus at this time.  Fibromyalgia - Under the care of Dr. Vear Clock. She remains on Cymbalta 60 mg 1 capsule by mouth daily, Lyrica 300 mg twice daily, and tizanidine 4 mg 3 times daily.   Bilateral carpal tunnel syndrome: Patient plans on proceeding with right carpal tunnel release in the future.  Chronic pain syndrome: She remains on Lyrica, Cymbalta, and tizanidine as prescribed.  She also takes morphine 30 mg every 12 hours for pain relief.   Other medical conditions are listed as follows:  History of osteoporosis: No DEXA in epic to review. She is taking Fosamax 70 mg 1 tablet by mouth once weekly.   Vitamin D deficiency: She is taking vitamin D 50,000 units once weekly.  History of hyperlipidemia  History of hypothyroidism  Essential hypertension: Blood pressure was 111/61 today in the office.  History of COPD  Prediabetes  Orders: No orders of the defined types were placed in this  encounter.  No orders of the defined types were placed in this encounter.    Follow-Up Instructions: Return in about 3 months (around 08/13/2022) for Rheumatoid arthritis, Osteoarthritis.   Gearldine Bienenstock, PA-C  Note - This record has been created using Dragon software.  Chart creation errors have been  sought, but may not always  have been located. Such creation errors do not reflect on  the standard of medical care.

## 2022-05-09 ENCOUNTER — Other Ambulatory Visit (HOSPITAL_COMMUNITY)
Admission: RE | Admit: 2022-05-09 | Discharge: 2022-05-09 | Disposition: A | Discharge status: Home / Self Care | Payer: Medicare Other | Source: Ambulatory Visit | Attending: Physician Assistant | Admitting: Physician Assistant

## 2022-05-09 DIAGNOSIS — Z79899 Other long term (current) drug therapy: Secondary | ICD-10-CM | POA: Diagnosis not present

## 2022-05-09 LAB — CBC WITH DIFFERENTIAL/PLATELET
Abs Immature Granulocytes: 0.01 10*3/uL (ref 0.00–0.07)
Basophils Absolute: 0 10*3/uL (ref 0.0–0.1)
Basophils Relative: 1 %
Eosinophils Absolute: 0.2 10*3/uL (ref 0.0–0.5)
Eosinophils Relative: 3 %
HCT: 45.9 % (ref 36.0–46.0)
Hemoglobin: 14.7 g/dL (ref 12.0–15.0)
Immature Granulocytes: 0 %
Lymphocytes Relative: 25 %
Lymphs Abs: 1.3 10*3/uL (ref 0.7–4.0)
MCH: 27.9 pg (ref 26.0–34.0)
MCHC: 32 g/dL (ref 30.0–36.0)
MCV: 87.1 fL (ref 80.0–100.0)
Monocytes Absolute: 0.5 10*3/uL (ref 0.1–1.0)
Monocytes Relative: 10 %
Neutro Abs: 3.1 10*3/uL (ref 1.7–7.7)
Neutrophils Relative %: 61 %
Platelets: 198 10*3/uL (ref 150–400)
RBC: 5.27 MIL/uL — ABNORMAL HIGH (ref 3.87–5.11)
RDW: 14.6 % (ref 11.5–15.5)
WBC: 5.2 10*3/uL (ref 4.0–10.5)
nRBC: 0 % (ref 0.0–0.2)

## 2022-05-09 LAB — COMPREHENSIVE METABOLIC PANEL
ALT: 30 U/L (ref 0–44)
AST: 34 U/L (ref 15–41)
Albumin: 4.1 g/dL (ref 3.5–5.0)
Alkaline Phosphatase: 56 U/L (ref 38–126)
Anion gap: 9 (ref 5–15)
BUN: 17 mg/dL (ref 8–23)
CO2: 27 mmol/L (ref 22–32)
Calcium: 9.3 mg/dL (ref 8.9–10.3)
Chloride: 102 mmol/L (ref 98–111)
Creatinine, Ser: 0.84 mg/dL (ref 0.44–1.00)
GFR, Estimated: >60 mL/min (ref >60)
Glucose, Bld: 95 mg/dL (ref 70–99)
Potassium: 4.1 mmol/L (ref 3.5–5.1)
Sodium: 138 mmol/L (ref 135–145)
Total Bilirubin: 0.7 mg/dL (ref 0.3–1.2)
Total Protein: 7.3 g/dL (ref 6.5–8.1)

## 2022-05-09 NOTE — Progress Notes (Signed)
CMP WNL

## 2022-05-09 NOTE — Progress Notes (Signed)
RBC count is borderline elevated. Rest of CBC WNL.

## 2022-05-13 ENCOUNTER — Ambulatory Visit: Payer: Medicare Other | Attending: Physician Assistant | Admitting: Physician Assistant

## 2022-05-13 ENCOUNTER — Encounter: Payer: Self-pay | Admitting: Physician Assistant

## 2022-05-13 VITALS — BP 111/61 | HR 86 | Resp 16 | Ht 61.0 in | Wt 145.0 lb

## 2022-05-13 DIAGNOSIS — G894 Chronic pain syndrome: Secondary | ICD-10-CM

## 2022-05-13 DIAGNOSIS — M2241 Chondromalacia patellae, right knee: Secondary | ICD-10-CM

## 2022-05-13 DIAGNOSIS — M19041 Primary osteoarthritis, right hand: Secondary | ICD-10-CM | POA: Diagnosis not present

## 2022-05-13 DIAGNOSIS — M1612 Unilateral primary osteoarthritis, left hip: Secondary | ICD-10-CM

## 2022-05-13 DIAGNOSIS — Z8739 Personal history of other diseases of the musculoskeletal system and connective tissue: Secondary | ICD-10-CM

## 2022-05-13 DIAGNOSIS — M2242 Chondromalacia patellae, left knee: Secondary | ICD-10-CM

## 2022-05-13 DIAGNOSIS — G5603 Carpal tunnel syndrome, bilateral upper limbs: Secondary | ICD-10-CM

## 2022-05-13 DIAGNOSIS — M19072 Primary osteoarthritis, left ankle and foot: Secondary | ICD-10-CM

## 2022-05-13 DIAGNOSIS — M1120 Other chondrocalcinosis, unspecified site: Secondary | ICD-10-CM

## 2022-05-13 DIAGNOSIS — Z79899 Other long term (current) drug therapy: Secondary | ICD-10-CM | POA: Diagnosis not present

## 2022-05-13 DIAGNOSIS — R768 Other specified abnormal immunological findings in serum: Secondary | ICD-10-CM

## 2022-05-13 DIAGNOSIS — Z8709 Personal history of other diseases of the respiratory system: Secondary | ICD-10-CM

## 2022-05-13 DIAGNOSIS — M19071 Primary osteoarthritis, right ankle and foot: Secondary | ICD-10-CM

## 2022-05-13 DIAGNOSIS — Z8639 Personal history of other endocrine, nutritional and metabolic disease: Secondary | ICD-10-CM

## 2022-05-13 DIAGNOSIS — M0579 Rheumatoid arthritis with rheumatoid factor of multiple sites without organ or systems involvement: Secondary | ICD-10-CM

## 2022-05-13 DIAGNOSIS — R7303 Prediabetes: Secondary | ICD-10-CM

## 2022-05-13 DIAGNOSIS — M19042 Primary osteoarthritis, left hand: Secondary | ICD-10-CM

## 2022-05-13 DIAGNOSIS — I10 Essential (primary) hypertension: Secondary | ICD-10-CM

## 2022-05-13 DIAGNOSIS — M797 Fibromyalgia: Secondary | ICD-10-CM

## 2022-05-13 DIAGNOSIS — E559 Vitamin D deficiency, unspecified: Secondary | ICD-10-CM

## 2022-05-13 NOTE — Patient Instructions (Signed)
Standing Labs We placed an order today for your standing lab work.   Please have your standing labs drawn in June and every 5 months   Please have your labs drawn 2 weeks prior to your appointment so that the provider can discuss your lab results at your appointment, if possible.  Please note that you may see your imaging and lab results in [place] before we have reviewed them. We will contact you once all results are reviewed. Please allow our office up to 72 hours to thoroughly review all of the results before contacting the office for clarification of your results.  WALK-IN LAB HOURS  Monday through Thursday from 8:00 am -12:30 pm and 1:00 pm-5:00 pm and Friday from 8:00 am-12:00 pm.  Patients with office visits requiring labs will be seen before walk-in labs.  You may encounter longer than normal wait times. Please allow additional time. Wait times may be shorter on  Monday and Thursday afternoons.  We do not book appointments for walk-in labs. We appreciate your patience and understanding with our staff.   Labs are drawn by [facility]. Please bring your co-pay at the time of your lab draw.  You may receive a bill from [place] for your lab work.  Please note if you are on Hydroxychloroquine and and an order has been placed for a Hydroxychloroquine level,  you will need to have it drawn 4 hours or more after your last dose.  If you wish to have your labs drawn at another location, please call the office 24 hours in advance so we can fax the orders.  The office is located at [address] [place], [place], [place] [address]   If you have any questions regarding directions or hours of operation,  please call [phone].   As a reminder, please drink plenty of water prior to coming for your lab work. Thanks!

## 2022-05-29 ENCOUNTER — Other Ambulatory Visit: Payer: Self-pay | Admitting: Physician Assistant

## 2022-06-18 DIAGNOSIS — M150 Primary generalized (osteo)arthritis: Secondary | ICD-10-CM | POA: Diagnosis not present

## 2022-06-18 DIAGNOSIS — M4722 Other spondylosis with radiculopathy, cervical region: Secondary | ICD-10-CM | POA: Diagnosis not present

## 2022-06-18 DIAGNOSIS — M4726 Other spondylosis with radiculopathy, lumbar region: Secondary | ICD-10-CM | POA: Diagnosis not present

## 2022-06-18 DIAGNOSIS — G894 Chronic pain syndrome: Secondary | ICD-10-CM | POA: Diagnosis not present

## 2022-06-25 ENCOUNTER — Other Ambulatory Visit: Payer: Self-pay | Admitting: Physician Assistant

## 2022-06-26 NOTE — Telephone Encounter (Signed)
Last Fill: 04/01/2022  Eye exam: normal  04/08/2022   Labs: 05/09/2022 CMP WNL RBC count is borderline elevated. Rest of CBC WNL.   Next Visit: 08/14/2022  Last Visit: 05/13/2022  DX: Rheumatoid arthritis with rheumatoid factor of multiple sites without organ or systems involvement   Current Dose per office note 05/13/2022: Plaquenil 200 mg 1 tablet by mouth BID M-F   Okay to refill Plaquenil?

## 2022-07-30 ENCOUNTER — Other Ambulatory Visit: Payer: Self-pay | Admitting: Orthopedic Surgery

## 2022-07-30 DIAGNOSIS — G5603 Carpal tunnel syndrome, bilateral upper limbs: Secondary | ICD-10-CM | POA: Diagnosis not present

## 2022-07-31 NOTE — Progress Notes (Unsigned)
Office Visit Note  Patient: Dawn Hughes             Date of Birth: Apr 02, 1955           MRN: [id number]             PCP: Benita Stabile, MD Referring: Farris Has, MD Visit Date: 08/14/2022 Occupation: @GUAROCC @  Subjective:  Medication monitoring   History of Present Illness: Dawn Hughes is a 67 y.o. female with history of seropositive rheumatoid arthritis and osteoarthritis.  Patient remains on Plaquenil 200 mg 1 tablet by mouth BID M-F.  Patient states that she has noticed insomnia over the past few months which she feels is related to Plaquenil use.  She states that on the weekends she sleeps well but once she restarts Plaquenil on Monday she has difficulty falling asleep.  Otherwise she has been tolerating Plaquenil without any side effects.  She continues to notice clinical improvement since initiating therapy.  She has had less pain and inflammation in both hands. Patient underwent right carpal tunnel release on 08/05/22.     Activities of Daily Living:  Patient reports morning stiffness for a few minutes  Patient Denies nocturnal pain.  Difficulty dressing/grooming: Denies Difficulty climbing stairs: Reports Difficulty getting out of chair: Reports Difficulty using hands for taps, buttons, cutlery, and/or writing: Reports  Review of Systems  Constitutional:  Positive for fatigue.  HENT:  Positive for mouth dryness. Negative for mouth sores and nose dryness.   Eyes:  Positive for dryness. Negative for pain and visual disturbance.  Respiratory:  Negative for cough, hemoptysis, shortness of breath and difficulty breathing.   Cardiovascular:  Negative for chest pain, palpitations, hypertension and swelling in legs/feet.  Gastrointestinal:  Negative for blood in stool, constipation and diarrhea.  Endocrine: Negative for increased urination.  Genitourinary:  Negative for painful urination.  Musculoskeletal:  Positive for joint pain, joint pain, myalgias, morning stiffness,  muscle tenderness and myalgias. Negative for joint swelling and muscle weakness.  Skin:  Negative for color change, pallor, rash, hair loss, nodules/bumps, skin tightness, ulcers and sensitivity to sunlight.  Allergic/Immunologic: Negative for susceptible to infections.  Neurological:  Positive for dizziness and headaches. Negative for numbness and weakness.  Hematological:  Negative for swollen glands.  Psychiatric/Behavioral:  Negative for depressed mood and sleep disturbance. The patient is not nervous/anxious.     PMFS History:  Patient Active Problem List   Diagnosis Date Noted   Vulvar ulceration 06/18/2020   Itching in the vaginal area 06/18/2020    Past Medical History:  Diagnosis Date   COPD (chronic obstructive pulmonary disease) (HCC)    Fibromyalgia    GAD (generalized anxiety disorder)    GERD (gastroesophageal reflux disease)    Hypertension    Hypothyroidism    Other intervertebral disc degeneration, lumbar region    Sleep apnea     Family History  Problem Relation Age of Onset   Diabetes Mother    Heart disease Mother    Diabetes Father    Breast cancer Paternal Grandmother    Asthma Daughter    Fibroids Daughter    Rheum arthritis Daughter    Cancer Daughter    Asthma Son    Psoriasis Son    Past Surgical History:  Procedure Laterality Date   CARPAL TUNNEL RELEASE Right 08/05/2022   Procedure: RIGHT CARPAL TUNNEL RELEASE;  Surgeon: Betha Loa, MD;  Location: [facility];  Service: Orthopedics;  Laterality: Right;   CHOLECYSTECTOMY  DILATION AND CURETTAGE OF UTERUS     LAPAROSCOPIC ASSISTED VAGINAL HYSTERECTOMY     SPINAL CORD STIMULATOR INSERTION     stomach stapling  1980   Social History   Social History Narrative   Not on file    There is no immunization history on file for this patient.   Objective: Vital Signs: BP (!) 148/83 (BP Location: Left Arm, Patient Position: Sitting, Cuff Size: Normal)   Pulse 78   Resp 15    Ht 5\' 1"  (1.549 m)   Wt 156 lb 12.8 oz (71.1 kg)   BMI 29.63 kg/m    Physical Exam Vitals and nursing note reviewed.  Constitutional:      Appearance: She is well-developed.  HENT:     Head: Normocephalic and atraumatic.  Eyes:     Conjunctiva/sclera: Conjunctivae normal.  Cardiovascular:     Rate and Rhythm: Normal rate and regular rhythm.     Heart sounds: Normal heart sounds.  Pulmonary:     Effort: Pulmonary effort is normal.     Breath sounds: Normal breath sounds.  Abdominal:     General: Bowel sounds are normal.     Palpations: Abdomen is soft.  Musculoskeletal:     Cervical back: Normal range of motion.  Lymphadenopathy:     Cervical: No cervical adenopathy.  Skin:    General: Skin is warm and dry.     Capillary Refill: Capillary refill takes less than 2 seconds.  Neurological:     Mental Status: She is alert and oriented to person, place, and time.  Psychiatric:        Behavior: Behavior normal.      Musculoskeletal Exam: C-spine has limited range of motion without rotation.  Limited mobility of lumbar spine.  Shoulder joints have good range of motion with some stiffness bilaterally.  Elbow joints have good ROM.  Right wrist bandaged on volar aspect.  Left wrist has good range of motion.  No tenderness or synovitis over MCP joints today.  Bilateral knee crepitus noted.  No warmth or effusion in the knee joints noted.  Ankle joints have good range of motion with no tenderness or joint swelling.  CDAI Exam: CDAI Score: -- Patient Global: 30 / 100; Provider Global: 30 / 100 Swollen: --; Tender: -- Joint Exam 08/14/2022   No joint exam has been documented for this visit   There is currently no information documented on the homunculus. Go to the Rheumatology activity and complete the homunculus joint exam.  Investigation: No additional findings.  Imaging: No results found.  Recent Labs: Lab Results  Component Value Date   WBC 5.2 05/09/2022   HGB 14.7  05/09/2022   PLT 198 05/09/2022   NA 138 05/09/2022   K 4.1 05/09/2022   CL 102 05/09/2022   CO2 27 05/09/2022   GLUCOSE 95 05/09/2022   BUN 17 05/09/2022   CREATININE 0.84 05/09/2022   BILITOT 0.7 05/09/2022   ALKPHOS 56 05/09/2022   AST 34 05/09/2022   ALT 30 05/09/2022   PROT 7.3 05/09/2022   ALBUMIN 4.1 05/09/2022   CALCIUM 9.3 05/09/2022    Speciality Comments: PLQ eye exam normal  04/08/2022 [facility] f/u 12 months  Procedures:  No procedures performed Allergies: Penicillin g   Assessment / Plan:     Visit Diagnoses: Rheumatoid arthritis with rheumatoid factor of multiple sites without organ or systems involvement (HCC) - Rheumatoid factor was 27, anti-CCP negative, ESR 36, CRP within normal limits  on 03/05/2022: No synovitis was noted on examination today.  She is taking Plaquenil 200 mg 1 tablet by mouth twice daily Monday through Friday.  She has been tolerating Plaquenil overall but has noted some increased insomnia on the nights that she takes Plaquenil.  Discussed that she can try reducing Plaquenil to 200 mg 1 tablet by mouth every morning which she plans on considering.  Overall she has noticed improvement in her symptoms since initiating Plaquenil.  She has been experiencing less joint stiffness and has been able to perform ADLs with less difficulty.  She will follow-up in the office in 3 months or sooner if needed.  High risk medication use - Plaquenil 200 mg 1 tablet by mouth BID M-F.  Plaquenil was initially started in the beginning of February 2024. CBC and CMP updated on 05/09/22. Orders for CBC and CMP released today.  PLQ eye exam normal 04/08/2022 [facility] f/u 12 months   - Plan: CBC with Differential/Platelet, COMPLETE METABOLIC PANEL WITH GFR  Primary osteoarthritis of both hands: PIP and DIP thickening consistent with osteoarthritis of both hands.  No synovitis noted.  Complete fist formation bilaterally.  Primary  osteoarthritis of left hip: Chronic pain.  Chondrocalcinosis - XR 03/05/22: Mild chondromalacia patella and chondrocalcinosis.  Chondromalacia of both patellae: Good ROM of both knee joints with no warmth or effusion.  Crepitus in both knees noted.   Primary osteoarthritis of both feet - No erosive changes noted.  Positive ANA (antinuclear antibody) - 03/05/22: ANA 1:40 cytoplasmic, 1:320Nuclear, nucleolar.  Double-stranded ENA negative, complements within normal limits, Smith antibody negative.  No clinical features of systemic lupus.  Fibromyalgia - Under the care of Dr. Vear Clock. She remains on Cymbalta 60 mg 1 capsule by mouth daily, Lyrica 300 mg twice daily, and tizanidine 4 mg 3 times daily.  Bilateral carpal tunnel syndrome: Patient underwent right carpal tunnel release surgery on 08/05/22.  Bandage noted on the volar aspect of her right wrist and palm.  She continues to experience paresthesias in the left wrist but is not ready to proceed with surgical release at this time.  Chronic pain syndrome - She remains on Lyrica, Cymbalta, and tizanidine as prescribed.  She also takes morphine 30 mg every 12 hours for pain relief.  She is scheduled to follow-up with pain management today.  History of osteoporosis -She remains on Fosamax 70 mg 1 tablet by mouth once weekly.  She is taking vitamin D 50,000 units once weekly.  Other medical conditions are listed as follows:   History of hyperlipidemia  Vitamin D deficiency  History of hypothyroidism  Essential hypertension: Blood pressure was 148/83 today in the office.  History of COPD  Prediabetes  Orders: Orders Placed This Encounter  Procedures   CBC with Differential/Platelet   COMPLETE METABOLIC PANEL WITH GFR   No orders of the defined types were placed in this encounter.    Follow-Up Instructions: Return in about 3 months (around 11/14/2022) for Rheumatoid arthritis.   Gearldine Bienenstock, PA-C  Note - This record has been  created using Dragon software.  Chart creation errors have been sought, but may not always  have been located. Such creation errors do not reflect on  the standard of medical care.

## 2022-08-01 ENCOUNTER — Other Ambulatory Visit: Payer: Self-pay

## 2022-08-01 ENCOUNTER — Encounter (HOSPITAL_BASED_OUTPATIENT_CLINIC_OR_DEPARTMENT_OTHER): Payer: Self-pay | Admitting: Orthopedic Surgery

## 2022-08-04 NOTE — Anesthesia Preprocedure Evaluation (Signed)
Anesthesia Evaluation  Patient identified by MRN, date of birth, ID band Patient awake    Reviewed: Allergy & Precautions, NPO status , Patient's Chart, lab work & pertinent test results  Airway Mallampati: I  TM Distance: >3 FB Neck ROM: Full    Dental no notable dental hx. (+) Upper Dentures, Lower Dentures   Pulmonary sleep apnea , COPD, former smoker   Pulmonary exam normal breath sounds clear to auscultation       Cardiovascular hypertension, Normal cardiovascular exam Rhythm:Regular Rate:Normal     Neuro/Psych  PSYCHIATRIC DISORDERS Anxiety        GI/Hepatic ,GERD  ,,  Endo/Other  Hypothyroidism    Renal/GU      Musculoskeletal  (+) Arthritis ,  Fibromyalgia -  Abdominal   Peds  Hematology   Anesthesia Other Findings All PCN g  Reproductive/Obstetrics                             Anesthesia Physical Anesthesia Plan  ASA: 3  Anesthesia Plan: Bier Block and MAC and Bier Block-Lidocaine Only   Post-op Pain Management: Regional block* and Minimal or no pain anticipated   Induction:   PONV Risk Score and Plan: 2 and Propofol infusion and Treatment may vary due to age or medical condition  Airway Management Planned: Nasal Cannula and Simple Face Mask  Additional Equipment: None  Intra-op Plan:   Post-operative Plan:   Informed Consent: I have reviewed the patients History and Physical, chart, labs and discussed the procedure including the risks, benefits and alternatives for the proposed anesthesia with the patient or authorized representative who has indicated his/her understanding and acceptance.     Dental advisory given  Plan Discussed with:   Anesthesia Plan Comments:         Anesthesia Quick Evaluation

## 2022-08-05 ENCOUNTER — Ambulatory Visit (HOSPITAL_BASED_OUTPATIENT_CLINIC_OR_DEPARTMENT_OTHER): Payer: 59 | Admitting: Anesthesiology

## 2022-08-05 ENCOUNTER — Other Ambulatory Visit: Payer: Self-pay

## 2022-08-05 ENCOUNTER — Encounter (HOSPITAL_BASED_OUTPATIENT_CLINIC_OR_DEPARTMENT_OTHER)
Admission: RE | Disposition: A | Discharge status: Home / Self Care | Payer: Self-pay | Source: Home / Self Care | Attending: Orthopedic Surgery

## 2022-08-05 ENCOUNTER — Ambulatory Visit (HOSPITAL_BASED_OUTPATIENT_CLINIC_OR_DEPARTMENT_OTHER)
Admission: RE | Admit: 2022-08-05 | Discharge: 2022-08-05 | Disposition: A | Discharge status: Home / Self Care | Payer: 59 | Attending: Orthopedic Surgery | Admitting: Orthopedic Surgery

## 2022-08-05 ENCOUNTER — Encounter (HOSPITAL_BASED_OUTPATIENT_CLINIC_OR_DEPARTMENT_OTHER): Payer: Self-pay | Admitting: Orthopedic Surgery

## 2022-08-05 DIAGNOSIS — G4730 Sleep apnea, unspecified: Secondary | ICD-10-CM | POA: Diagnosis not present

## 2022-08-05 DIAGNOSIS — J449 Chronic obstructive pulmonary disease, unspecified: Secondary | ICD-10-CM

## 2022-08-05 DIAGNOSIS — Z87891 Personal history of nicotine dependence: Secondary | ICD-10-CM | POA: Insufficient documentation

## 2022-08-05 DIAGNOSIS — I10 Essential (primary) hypertension: Secondary | ICD-10-CM | POA: Diagnosis not present

## 2022-08-05 DIAGNOSIS — G5601 Carpal tunnel syndrome, right upper limb: Secondary | ICD-10-CM

## 2022-08-05 DIAGNOSIS — M1990 Unspecified osteoarthritis, unspecified site: Secondary | ICD-10-CM | POA: Insufficient documentation

## 2022-08-05 DIAGNOSIS — K219 Gastro-esophageal reflux disease without esophagitis: Secondary | ICD-10-CM | POA: Insufficient documentation

## 2022-08-05 DIAGNOSIS — F411 Generalized anxiety disorder: Secondary | ICD-10-CM | POA: Insufficient documentation

## 2022-08-05 DIAGNOSIS — E039 Hypothyroidism, unspecified: Secondary | ICD-10-CM | POA: Insufficient documentation

## 2022-08-05 DIAGNOSIS — Z01818 Encounter for other preprocedural examination: Secondary | ICD-10-CM

## 2022-08-05 DIAGNOSIS — M797 Fibromyalgia: Secondary | ICD-10-CM | POA: Diagnosis not present

## 2022-08-05 HISTORY — DX: Gastro-esophageal reflux disease without esophagitis: K21.9

## 2022-08-05 HISTORY — DX: Sleep apnea, unspecified: G47.30

## 2022-08-05 HISTORY — PX: CARPAL TUNNEL RELEASE: SHX101

## 2022-08-05 SURGERY — CARPAL TUNNEL RELEASE
Anesthesia: Monitor Anesthesia Care | Site: Hand | Laterality: Right

## 2022-08-05 MED ORDER — PROPOFOL 500 MG/50ML IV EMUL
INTRAVENOUS | Status: DC | PRN
  Administered 2022-08-05: 100 ug/kg/min via INTRAVENOUS

## 2022-08-05 MED ORDER — FENTANYL CITRATE (PF) 100 MCG/2ML IJ SOLN
INTRAMUSCULAR | Status: AC
  Filled 2022-08-05: qty 2

## 2022-08-05 MED ORDER — LIDOCAINE HCL (PF) 0.5 % IJ SOLN
INTRAMUSCULAR | Status: DC | PRN
  Administered 2022-08-05: 30 mL via INTRAVENOUS

## 2022-08-05 MED ORDER — MIDAZOLAM HCL 5 MG/5ML IJ SOLN
INTRAMUSCULAR | Status: DC | PRN
  Administered 2022-08-05: 1 mg via INTRAVENOUS

## 2022-08-05 MED ORDER — MIDAZOLAM HCL 2 MG/2ML IJ SOLN
INTRAMUSCULAR | Status: AC
  Filled 2022-08-05: qty 2

## 2022-08-05 MED ORDER — ONDANSETRON HCL 4 MG/2ML IJ SOLN: 4.0000 mg | Freq: Once | INTRAMUSCULAR | Status: DC | PRN

## 2022-08-05 MED ORDER — PROPOFOL 10 MG/ML IV BOLUS
INTRAVENOUS | Status: DC | PRN
  Administered 2022-08-05: 10 mg via INTRAVENOUS

## 2022-08-05 MED ORDER — BUPIVACAINE HCL (PF) 0.25 % IJ SOLN
INTRAMUSCULAR | Status: AC
  Filled 2022-08-05: qty 30

## 2022-08-05 MED ORDER — CEFAZOLIN SODIUM-DEXTROSE 2-4 GM/100ML-% IV SOLN
2.0000 g | INTRAVENOUS | Status: AC
  Administered 2022-08-05: 2 g via INTRAVENOUS

## 2022-08-05 MED ORDER — BUPIVACAINE HCL (PF) 0.25 % IJ SOLN
INTRAMUSCULAR | Status: DC | PRN
  Administered 2022-08-05: 9 mL

## 2022-08-05 MED ORDER — FENTANYL CITRATE (PF) 100 MCG/2ML IJ SOLN: 25.0000 ug | INTRAMUSCULAR | Status: DC | PRN

## 2022-08-05 MED ORDER — CEFAZOLIN SODIUM-DEXTROSE 2-4 GM/100ML-% IV SOLN
INTRAVENOUS | Status: AC
  Filled 2022-08-05: qty 100

## 2022-08-05 MED ORDER — LACTATED RINGERS IV SOLN: INTRAVENOUS | Status: DC

## 2022-08-05 MED ORDER — 0.9 % SODIUM CHLORIDE (POUR BTL) OPTIME
TOPICAL | Status: DC | PRN
  Administered 2022-08-05: 120 mL

## 2022-08-05 MED ORDER — FENTANYL CITRATE (PF) 100 MCG/2ML IJ SOLN
INTRAMUSCULAR | Status: DC | PRN
  Administered 2022-08-05: 50 ug via INTRAVENOUS

## 2022-08-05 MED ORDER — ACETAMINOPHEN 10 MG/ML IV SOLN: 1000.0000 mg | Freq: Once | INTRAVENOUS | Status: DC | PRN

## 2022-08-05 MED ORDER — LIDOCAINE HCL (CARDIAC) PF 100 MG/5ML IV SOSY
PREFILLED_SYRINGE | INTRAVENOUS | Status: DC | PRN
  Administered 2022-08-05: 20 mg via INTRAVENOUS

## 2022-08-05 SURGICAL SUPPLY — 40 items
APL PRP STRL LF DISP 70% ISPRP (MISCELLANEOUS) ×1
BLADE SURG 15 STRL LF DISP TIS (BLADE) ×2 IMPLANT
BLADE SURG 15 STRL SS (BLADE) ×2
BNDG CMPR 5X3 KNIT ELC UNQ LF (GAUZE/BANDAGES/DRESSINGS) ×1
BNDG CMPR 9X4 STRL LF SNTH (GAUZE/BANDAGES/DRESSINGS) ×1
BNDG ELASTIC 3INX 5YD STR LF (GAUZE/BANDAGES/DRESSINGS) ×1 IMPLANT
BNDG ESMARK 4X9 LF (GAUZE/BANDAGES/DRESSINGS) IMPLANT
BNDG GAUZE DERMACEA FLUFF 4 (GAUZE/BANDAGES/DRESSINGS) ×1 IMPLANT
BNDG GZE DERMACEA 4 6PLY (GAUZE/BANDAGES/DRESSINGS) ×1
CHLORAPREP W/TINT 26 (MISCELLANEOUS) ×1 IMPLANT
CORD BIPOLAR FORCEPS 12FT (ELECTRODE) ×1 IMPLANT
COVER BACK TABLE 60X90IN (DRAPES) ×1 IMPLANT
COVER MAYO STAND STRL (DRAPES) ×1 IMPLANT
CUFF TOURN SGL QUICK 18X4 (TOURNIQUET CUFF) ×1 IMPLANT
DRAPE EXTREMITY T 121X128X90 (DISPOSABLE) ×1 IMPLANT
DRAPE SURG 17X23 STRL (DRAPES) ×1 IMPLANT
GAUZE PAD ABD 8X10 STRL (GAUZE/BANDAGES/DRESSINGS) ×1 IMPLANT
GAUZE SPONGE 4X4 12PLY STRL (GAUZE/BANDAGES/DRESSINGS) ×1 IMPLANT
GAUZE XEROFORM 1X8 LF (GAUZE/BANDAGES/DRESSINGS) ×1 IMPLANT
GLOVE BIO SURGEON STRL SZ7.5 (GLOVE) ×1 IMPLANT
GLOVE BIOGEL PI IND STRL 7.0 (GLOVE) IMPLANT
GLOVE BIOGEL PI IND STRL 8 (GLOVE) ×1 IMPLANT
GLOVE SURG SS PI 6.5 STRL IVOR (GLOVE) IMPLANT
GLOVE SURG SYN 8.0 (GLOVE) ×2 IMPLANT
GLOVE SURG SYN 8.0 PF PI (GLOVE) IMPLANT
GOWN STRL REUS W/ TWL LRG LVL3 (GOWN DISPOSABLE) ×1 IMPLANT
GOWN STRL REUS W/ TWL XL LVL3 (GOWN DISPOSABLE) IMPLANT
GOWN STRL REUS W/TWL LRG LVL3 (GOWN DISPOSABLE) ×1
GOWN STRL REUS W/TWL XL LVL3 (GOWN DISPOSABLE) ×2 IMPLANT
NDL HYPO 25X1 1.5 SAFETY (NEEDLE) ×1 IMPLANT
NEEDLE HYPO 25X1 1.5 SAFETY (NEEDLE) ×1 IMPLANT
NS IRRIG 1000ML POUR BTL (IV SOLUTION) ×1 IMPLANT
PACK BASIN DAY SURGERY FS (CUSTOM PROCEDURE TRAY) ×1 IMPLANT
PADDING CAST ABS COTTON 4X4 ST (CAST SUPPLIES) ×1 IMPLANT
STOCKINETTE 4X48 STRL (DRAPES) ×1 IMPLANT
SUT ETHILON 4 0 PS 2 18 (SUTURE) ×1 IMPLANT
SYR BULB EAR ULCER 3OZ GRN STR (SYRINGE) ×1 IMPLANT
SYR CONTROL 10ML LL (SYRINGE) ×1 IMPLANT
TOWEL GREEN STERILE FF (TOWEL DISPOSABLE) ×2 IMPLANT
UNDERPAD 30X36 HEAVY ABSORB (UNDERPADS AND DIAPERS) ×1 IMPLANT

## 2022-08-05 NOTE — Discharge Instructions (Addendum)

## 2022-08-05 NOTE — Op Note (Signed)
08/05/2022 [facility]                              OPERATIVE REPORT   PREOPERATIVE DIAGNOSIS:  Right carpal tunnel syndrome.  POSTOPERATIVE DIAGNOSIS:  Right carpal tunnel syndrome.  PROCEDURE:  Right carpal tunnel release.  SURGEON:  Betha Loa, MD  ASSISTANT:  none.  ANESTHESIA: Bier block with sedation  IV FLUIDS:  Per anesthesia flow sheet.  ESTIMATED BLOOD LOSS:  Minimal.  COMPLICATIONS:  None.  SPECIMENS:  None.  TOURNIQUET TIME:    Total Tourniquet Time Documented: Forearm (Right) - 19 minutes Total: Forearm (Right) - 19 minutes   DISPOSITION:  Stable to PACU.  LOCATION: [facility]  INDICATIONS:  67 y.o. yo female with numbness and tingling right hand.  Nocturnal symptoms. Positive nerve conduction studies. She wishes to proceed with right carpal tunnel release.  Risks, benefits and alternatives of surgery were discussed including the risk of blood loss; infection; damage to nerves, vessels, tendons, ligaments, bone; failure of surgery; need for additional surgery; complications with wound healing; continued pain; recurrence of carpal tunnel syndrome; and damage to motor branch. She voiced understanding of these risks and elected to proceed.   OPERATIVE COURSE:  After being identified preoperatively by myself, the patient and I agreed upon the procedure and site of procedure.  The surgical site was marked.  Surgical consent had been signed.  She was given IV Ancef as preoperative antibiotic prophylaxis.  She was transferred to the operating room and placed on the operating room table in supine position with the Right upper extremity on an armboard.  Bier block anesthesia was induced by the anesthesiologist.  Right upper extremity was prepped and draped in normal sterile orthopaedic fashion.  A surgical pause was performed between the surgeons, anesthesia, and operating room staff, and all were in agreement as to the patient, procedure, and  site of procedure.  Tourniquet at the proximal aspect of the forearm had been inflated for the Bier block  Incision was made over the transverse carpal ligament and carried into the subcutaneous tissues by spreading technique.  Bipolar electrocautery was used to obtain hemostasis.  The palmar fascia was sharply incised.  The transverse carpal ligament was identified.  The fascia distal to the ligament was opened.  Retractor was placed and the flexor tendons were identified.  The flexor tendon to the ring finger was identified and retracted radially.  The transverse carpal ligament was then incised from distal to proximal under direct visualization.  Scissors were used to split the distal aspect of the volar antebrachial fascia.  A finger was placed into the wound to ensure complete decompression, which was the case.  The nerve was examined.  There was an hourglass deformity.  The motor branch was identified and was intact.  The wound was copiously irrigated with sterile saline.  It was then closed with 4-0 nylon in a horizontal mattress fashion.  It was injected with 0.25% plain Marcaine to aid in postoperative analgesia.  It was dressed with sterile Xeroform, 4x4s, an ABD, and wrapped with Kerlix and an Ace bandage.  Tourniquet was deflated at 19 minutes.  Fingertips were pink with brisk capillary refill after deflation of the tourniquet.  Operative drapes were broken down.  The patient was awoken from anesthesia safely.  She was transferred back to stretcher and taken to the PACU in stable condition.  I will see her back  in the office in 1 week for postoperative followup.  She states she takes morphine at home already and does not need more pain medication.    Betha Loa, MD Electronically signed, 08/05/22

## 2022-08-05 NOTE — Anesthesia Postprocedure Evaluation (Signed)
Anesthesia Post Note  Patient: Saleah Rishel  Procedure(s) Performed: RIGHT CARPAL TUNNEL RELEASE (Right: Hand)     Patient location during evaluation: PACU Anesthesia Type: MAC and Bier Block Level of consciousness: awake and alert Pain management: pain level controlled Vital Signs Assessment: post-procedure vital signs reviewed and stable Respiratory status: spontaneous breathing, nonlabored ventilation, respiratory function stable and patient connected to nasal cannula oxygen Cardiovascular status: stable and blood pressure returned to baseline Postop Assessment: no apparent nausea or vomiting Anesthetic complications: no   No notable events documented.  Last Vitals:  Vitals:   08/05/22 1415 08/05/22 1421  BP:  (!) 157/87  Pulse:  80  Resp: 15 16  Temp:  36.5 C  SpO2:  95%    Last Pain:  Vitals:   08/05/22 1421  TempSrc:   PainSc: 0-No pain                 Trevor Iha

## 2022-08-05 NOTE — H&P (Signed)
Dawn Hughes is an 67 y.o. female.   Chief Complaint: carpal tunnel syndrome HPI: 67 y.o. yo female with numbness and tingling right hand in median nerve distribution.  Nocturnal symptoms. Positive nerve conduction studies by history. She wishes to have right carpal tunnel release.   Allergies:  Allergies  Allergen Reactions   Penicillin G Hives    Past Medical History:  Diagnosis Date   COPD (chronic obstructive pulmonary disease) (HCC)    Fibromyalgia    GAD (generalized anxiety disorder)    GERD (gastroesophageal reflux disease)    Hypertension    Hypothyroidism    Other intervertebral disc degeneration, lumbar region    Sleep apnea     Past Surgical History:  Procedure Laterality Date   CHOLECYSTECTOMY     DILATION AND CURETTAGE OF UTERUS     LAPAROSCOPIC ASSISTED VAGINAL HYSTERECTOMY     SPINAL CORD STIMULATOR INSERTION     stomach stapling  1980    Family History: Family History  Problem Relation Age of Onset   Diabetes Mother    Heart disease Mother    Diabetes Father    Breast cancer Paternal Grandmother    Asthma Daughter    Fibroids Daughter    Rheum arthritis Daughter    Cancer Daughter    Asthma Son    Psoriasis Son     Social History:   reports that she quit smoking about 3 years ago. Her smoking use included cigarettes. She has a 52.00 pack-year smoking history. She has never been exposed to tobacco smoke. She has never used smokeless tobacco. She reports that she does not drink alcohol and does not use drugs.  Medications: Medications Prior to Admission  Medication Sig Dispense Refill   albuterol (VENTOLIN HFA) 108 (90 Base) MCG/ACT inhaler Inhale into the lungs every 6 (six) hours as needed for wheezing or shortness of breath.     alendronate (FOSAMAX) 70 MG tablet Take 70 mg by mouth once a week.     atorvastatin (LIPITOR) 20 MG tablet Take 20 mg by mouth daily.     DULoxetine (CYMBALTA) 60 MG capsule Take 60 mg by mouth 2 (two) times daily.      fluticasone-salmeterol (ADVAIR) 250-50 MCG/ACT AEPB 1 puff 2 (two) times daily as needed.     hydroxychloroquine (PLAQUENIL) 200 MG tablet TAKE 1 TABLET BY MOUTH TWICE DAILY MONDAY THROUGH FRIDAY ONLY, DO NOT TAKE ON SATURDAY AND SUNDAY 120 tablet 0   levothyroxine (SYNTHROID) 75 MCG tablet Take 75 mcg by mouth every morning.     morphine (MS CONTIN) 30 MG 12 hr tablet Take 30 mg by mouth every 12 (twelve) hours.     Multiple Vitamin (MULTIVITAMIN PO) Take by mouth.     pantoprazole (PROTONIX) 40 MG tablet Take 40 mg by mouth daily.     pregabalin (LYRICA) 300 MG capsule Take 300 mg by mouth 2 (two) times daily.     tiZANidine (ZANAFLEX) 4 MG capsule Take 4 mg by mouth 3 (three) times daily.     Vitamin D, Ergocalciferol, 50000 units CAPS Take 50,000 Units by mouth once a week.      No results found for this or any previous visit (from the past 48 hour(s)).  No results found.    Blood pressure (!) 127/58, pulse 72, temperature (!) 97.3 F (36.3 C), temperature source Oral, resp. rate 14, height 5\' 1"  (1.549 m), weight 70.8 kg, SpO2 98 %.  General appearance: alert, cooperative, and appears stated age Head:  Normocephalic, without obvious abnormality, atraumatic Neck: supple, symmetrical, trachea midline Extremities: Intact sensation and capillary refill all digits but feels different in median nerve distrubution.  +epl/fpl/io.  No wounds. Positive Durkin's test. Pulses: 2+ and symmetric Skin: Skin color, texture, turgor normal. No rashes or lesions Neurologic: Grossly normal Incision/Wound: none  Assessment/Plan Right carpal tunnel syndrome.  Non operative and operative treatment options have been discussed with the patient and patient wishes to proceed with operative treatment. Risks, benefits and alternatives of surgery were discussed including risks of blood loss, infection, damage to nerves/vessels/tendons/ligament/bone, failure of surgery, need for additional surgery,  complication with wound healing, stiffness, recurrence.  She voiced understanding of these risks and elected to proceed.    Betha Loa 08/05/2022, 12:52 PM

## 2022-08-05 NOTE — Transfer of Care (Signed)
Immediate Anesthesia Transfer of Care Note  Patient: Dawn Hughes  Procedure(s) Performed: RIGHT CARPAL TUNNEL RELEASE (Right: Hand)  Patient Location: PACU  Anesthesia Type:Bier block  Level of Consciousness: drowsy and patient cooperative  Airway & Oxygen Therapy: Patient Spontanous Breathing and Patient connected to face mask oxygen  Post-op Assessment: Report given to RN and Post -op Vital signs reviewed and stable  Post vital signs: Reviewed and stable  Last Vitals:  Vitals Value Taken Time  BP    Temp    Pulse    Resp    SpO2      Last Pain:  Vitals:   08/05/22 1129  TempSrc: Oral  PainSc: 0-No pain         Complications: No notable events documented.

## 2022-08-05 NOTE — Anesthesia Procedure Notes (Signed)
Procedure Name: MAC Date/Time: 08/05/2022 1:08 PM  Performed by: Sheryn Bison, CRNAPre-anesthesia Checklist: Patient identified, Emergency Drugs available, Suction available, Patient being monitored and Timeout performed Patient Re-evaluated:Patient Re-evaluated prior to induction Oxygen Delivery Method: Simple face mask

## 2022-08-05 NOTE — Anesthesia Procedure Notes (Signed)
Anesthesia Regional Block: Bier block (IV Regional)   Pre-Anesthetic Checklist: , timeout performed,  Correct Patient, Correct Site, Correct Laterality,  Correct Procedure,, site marked,  Surgical consent,  At surgeon's request  Laterality: Right         Needles:  Injection technique: Single-shot  Needle Type: Other      Needle Gauge: 20     Additional Needles:   Procedures:,,,,, intact distal pulses, Esmarch exsanguination,  Single tourniquet utilized    Narrative:   Performed by: Personally      

## 2022-08-06 ENCOUNTER — Encounter (HOSPITAL_BASED_OUTPATIENT_CLINIC_OR_DEPARTMENT_OTHER): Payer: Self-pay | Admitting: Orthopedic Surgery

## 2022-08-12 DIAGNOSIS — G5603 Carpal tunnel syndrome, bilateral upper limbs: Secondary | ICD-10-CM | POA: Diagnosis not present

## 2022-08-14 ENCOUNTER — Ambulatory Visit: Payer: 59 | Attending: Physician Assistant | Admitting: Physician Assistant

## 2022-08-14 ENCOUNTER — Encounter: Payer: Self-pay | Admitting: Physician Assistant

## 2022-08-14 VITALS — BP 148/83 | HR 78 | Resp 15 | Ht 61.0 in | Wt 156.8 lb

## 2022-08-14 DIAGNOSIS — M19041 Primary osteoarthritis, right hand: Secondary | ICD-10-CM | POA: Diagnosis not present

## 2022-08-14 DIAGNOSIS — G894 Chronic pain syndrome: Secondary | ICD-10-CM | POA: Diagnosis not present

## 2022-08-14 DIAGNOSIS — M2242 Chondromalacia patellae, left knee: Secondary | ICD-10-CM

## 2022-08-14 DIAGNOSIS — Z79899 Other long term (current) drug therapy: Secondary | ICD-10-CM | POA: Diagnosis not present

## 2022-08-14 DIAGNOSIS — M1612 Unilateral primary osteoarthritis, left hip: Secondary | ICD-10-CM | POA: Diagnosis not present

## 2022-08-14 DIAGNOSIS — M150 Primary generalized (osteo)arthritis: Secondary | ICD-10-CM | POA: Diagnosis not present

## 2022-08-14 DIAGNOSIS — Z8709 Personal history of other diseases of the respiratory system: Secondary | ICD-10-CM

## 2022-08-14 DIAGNOSIS — M0579 Rheumatoid arthritis with rheumatoid factor of multiple sites without organ or systems involvement: Secondary | ICD-10-CM

## 2022-08-14 DIAGNOSIS — I10 Essential (primary) hypertension: Secondary | ICD-10-CM

## 2022-08-14 DIAGNOSIS — M1120 Other chondrocalcinosis, unspecified site: Secondary | ICD-10-CM

## 2022-08-14 DIAGNOSIS — E559 Vitamin D deficiency, unspecified: Secondary | ICD-10-CM

## 2022-08-14 DIAGNOSIS — M4726 Other spondylosis with radiculopathy, lumbar region: Secondary | ICD-10-CM | POA: Diagnosis not present

## 2022-08-14 DIAGNOSIS — M19071 Primary osteoarthritis, right ankle and foot: Secondary | ICD-10-CM | POA: Diagnosis not present

## 2022-08-14 DIAGNOSIS — M2241 Chondromalacia patellae, right knee: Secondary | ICD-10-CM | POA: Diagnosis not present

## 2022-08-14 DIAGNOSIS — M19072 Primary osteoarthritis, left ankle and foot: Secondary | ICD-10-CM

## 2022-08-14 DIAGNOSIS — Z8739 Personal history of other diseases of the musculoskeletal system and connective tissue: Secondary | ICD-10-CM

## 2022-08-14 DIAGNOSIS — M19042 Primary osteoarthritis, left hand: Secondary | ICD-10-CM

## 2022-08-14 DIAGNOSIS — R768 Other specified abnormal immunological findings in serum: Secondary | ICD-10-CM | POA: Diagnosis not present

## 2022-08-14 DIAGNOSIS — G5603 Carpal tunnel syndrome, bilateral upper limbs: Secondary | ICD-10-CM

## 2022-08-14 DIAGNOSIS — M4722 Other spondylosis with radiculopathy, cervical region: Secondary | ICD-10-CM | POA: Diagnosis not present

## 2022-08-14 DIAGNOSIS — R7303 Prediabetes: Secondary | ICD-10-CM

## 2022-08-14 DIAGNOSIS — M797 Fibromyalgia: Secondary | ICD-10-CM

## 2022-08-14 DIAGNOSIS — Z8639 Personal history of other endocrine, nutritional and metabolic disease: Secondary | ICD-10-CM

## 2022-08-14 LAB — CBC WITH DIFFERENTIAL/PLATELET
Absolute Monocytes: 474 cells/uL (ref 200–950)
Basophils Absolute: 19 cells/uL (ref 0–200)
Basophils Relative: 0.3 %
Eosinophils Absolute: 179 cells/uL (ref 15–500)
Eosinophils Relative: 2.8 %
HCT: 41.7 % (ref 35.0–45.0)
Hemoglobin: 13.3 g/dL (ref 11.7–15.5)
Lymphs Abs: 1344 cells/uL (ref 850–3900)
MCH: 26.7 pg — ABNORMAL LOW (ref 27.0–33.0)
MCHC: 31.9 g/dL — ABNORMAL LOW (ref 32.0–36.0)
MCV: 83.7 fL (ref 80.0–100.0)
MPV: 11.8 fL (ref 7.5–12.5)
Monocytes Relative: 7.4 %
Neutro Abs: 4384 cells/uL (ref 1500–7800)
Neutrophils Relative %: 68.5 %
Platelets: 175 10*3/uL (ref 140–400)
RBC: 4.98 10*6/uL (ref 3.80–5.10)
RDW: 13 % (ref 11.0–15.0)
Total Lymphocyte: 21 %
WBC: 6.4 10*3/uL (ref 3.8–10.8)

## 2022-08-14 LAB — COMPLETE METABOLIC PANEL WITH GFR
AG Ratio: 1.9 (calc) (ref 1.0–2.5)
ALT: 19 U/L (ref 6–29)
AST: 27 U/L (ref 10–35)
Albumin: 4.1 g/dL (ref 3.6–5.1)
Alkaline phosphatase (APISO): 63 U/L (ref 37–153)
BUN: 15 mg/dL (ref 7–25)
CO2: 33 mmol/L — ABNORMAL HIGH (ref 20–32)
Calcium: 9.5 mg/dL (ref 8.6–10.4)
Chloride: 101 mmol/L (ref 98–110)
Creat: 0.68 mg/dL (ref 0.50–1.05)
Globulin: 2.2 g/dL (calc) (ref 1.9–3.7)
Glucose, Bld: 103 mg/dL — ABNORMAL HIGH (ref 65–99)
Potassium: 4.5 mmol/L (ref 3.5–5.3)
Sodium: 143 mmol/L (ref 135–146)
Total Bilirubin: 0.4 mg/dL (ref 0.2–1.2)
Total Protein: 6.3 g/dL (ref 6.1–8.1)
eGFR: 95 mL/min/{1.73_m2} (ref >=60)

## 2022-08-18 NOTE — Progress Notes (Signed)
CBC and CMP stable. No change in therapy recommended.

## 2022-08-19 DIAGNOSIS — G5603 Carpal tunnel syndrome, bilateral upper limbs: Secondary | ICD-10-CM | POA: Diagnosis not present

## 2022-09-18 ENCOUNTER — Other Ambulatory Visit: Payer: Self-pay | Admitting: Rheumatology

## 2022-09-18 NOTE — Telephone Encounter (Signed)
Last Fill: 06/26/2022  Eye exam: 04/08/2022   Labs: 08/14/2022 CBC and CMP stable. No change in therapy recommended.   Next Visit: 11/13/2022  Last Visit: 08/14/2022  KG:[id number] arthritis with rheumatoid factor of multiple sites without organ or systems involvement   Current Dose per office note 08/14/2022: Plaquenil 200 mg 1 tablet by mouth BID M-F.   Okay to refill Plaquenil?

## 2022-09-22 DIAGNOSIS — G5603 Carpal tunnel syndrome, bilateral upper limbs: Secondary | ICD-10-CM | POA: Diagnosis not present

## 2022-09-25 DIAGNOSIS — I10 Essential (primary) hypertension: Secondary | ICD-10-CM | POA: Diagnosis not present

## 2022-09-25 DIAGNOSIS — R7303 Prediabetes: Secondary | ICD-10-CM | POA: Diagnosis not present

## 2022-09-25 DIAGNOSIS — E039 Hypothyroidism, unspecified: Secondary | ICD-10-CM | POA: Diagnosis not present

## 2022-09-25 DIAGNOSIS — E559 Vitamin D deficiency, unspecified: Secondary | ICD-10-CM | POA: Diagnosis not present

## 2022-09-26 LAB — LAB REPORT - SCANNED: Albumin, Urine POC: 8.8

## 2022-10-08 DIAGNOSIS — E559 Vitamin D deficiency, unspecified: Secondary | ICD-10-CM | POA: Diagnosis not present

## 2022-10-08 DIAGNOSIS — G894 Chronic pain syndrome: Secondary | ICD-10-CM | POA: Diagnosis not present

## 2022-10-08 DIAGNOSIS — M069 Rheumatoid arthritis, unspecified: Secondary | ICD-10-CM | POA: Diagnosis not present

## 2022-10-08 DIAGNOSIS — G8929 Other chronic pain: Secondary | ICD-10-CM | POA: Diagnosis not present

## 2022-10-08 DIAGNOSIS — I10 Essential (primary) hypertension: Secondary | ICD-10-CM | POA: Diagnosis not present

## 2022-10-08 DIAGNOSIS — R7303 Prediabetes: Secondary | ICD-10-CM | POA: Diagnosis not present

## 2022-10-08 DIAGNOSIS — E785 Hyperlipidemia, unspecified: Secondary | ICD-10-CM | POA: Diagnosis not present

## 2022-10-08 DIAGNOSIS — E039 Hypothyroidism, unspecified: Secondary | ICD-10-CM | POA: Diagnosis not present

## 2022-10-08 DIAGNOSIS — Z0001 Encounter for general adult medical examination with abnormal findings: Secondary | ICD-10-CM | POA: Diagnosis not present

## 2022-10-08 DIAGNOSIS — J449 Chronic obstructive pulmonary disease, unspecified: Secondary | ICD-10-CM | POA: Diagnosis not present

## 2022-10-08 DIAGNOSIS — M797 Fibromyalgia: Secondary | ICD-10-CM | POA: Diagnosis not present

## 2022-10-08 DIAGNOSIS — M545 Low back pain, unspecified: Secondary | ICD-10-CM | POA: Diagnosis not present

## 2022-10-09 ENCOUNTER — Telehealth: Payer: Self-pay | Admitting: *Deleted

## 2022-10-09 DIAGNOSIS — M4726 Other spondylosis with radiculopathy, lumbar region: Secondary | ICD-10-CM | POA: Diagnosis not present

## 2022-10-09 DIAGNOSIS — M150 Primary generalized (osteo)arthritis: Secondary | ICD-10-CM | POA: Diagnosis not present

## 2022-10-09 DIAGNOSIS — M4722 Other spondylosis with radiculopathy, cervical region: Secondary | ICD-10-CM | POA: Diagnosis not present

## 2022-10-09 DIAGNOSIS — Z79891 Long term (current) use of opiate analgesic: Secondary | ICD-10-CM | POA: Diagnosis not present

## 2022-10-09 DIAGNOSIS — G894 Chronic pain syndrome: Secondary | ICD-10-CM | POA: Diagnosis not present

## 2022-10-09 NOTE — Telephone Encounter (Signed)
Labs received from:Brandi Tiburcio Pea, FNP-C  Drawn on: 09/25/2022  Reviewed by:Sherron Ales, PA-C  Labs drawn:Vitamin D, Hgb A1C, Albumin/Creatinine Ratio, Lipid Panel, CMP, CBC, TSH  Results: Hgb A1C 5.8     Cholesterol, Total 204    LDL Chol Calc 113    Glucose 67    Protein, Total 5.8    Albumin 3.8    TSH 4.950  Patient is on PLQ 200 mg po BID Monday-Friday.

## 2022-10-21 ENCOUNTER — Other Ambulatory Visit (HOSPITAL_COMMUNITY): Payer: Self-pay | Admitting: Internal Medicine

## 2022-10-21 DIAGNOSIS — Z1231 Encounter for screening mammogram for malignant neoplasm of breast: Secondary | ICD-10-CM

## 2022-10-30 ENCOUNTER — Encounter: Payer: Self-pay | Admitting: *Deleted

## 2022-10-31 ENCOUNTER — Ambulatory Visit (HOSPITAL_COMMUNITY)
Admission: RE | Admit: 2022-10-31 | Discharge: 2022-10-31 | Disposition: A | Discharge status: Home / Self Care | Payer: 59 | Source: Ambulatory Visit | Attending: Internal Medicine | Admitting: Internal Medicine

## 2022-10-31 ENCOUNTER — Encounter (INDEPENDENT_AMBULATORY_CARE_PROVIDER_SITE_OTHER): Payer: Self-pay

## 2022-10-31 DIAGNOSIS — Z1231 Encounter for screening mammogram for malignant neoplasm of breast: Secondary | ICD-10-CM | POA: Diagnosis not present

## 2022-11-03 NOTE — Progress Notes (Unsigned)
Office Visit Note  Patient: Dawn Hughes             Date of Birth: October 19, 1955           MRN: [id number]             PCP: Benita Stabile, MD Referring: Benita Stabile, MD Visit Date: 11/13/2022 Occupation: @GUAROCC @  Subjective:  Medication monitoring   History of Present Illness: Dawn Hughes is a 67 y.o. female with history of seropositive rheumatoid arthritis and osteoarthritis.  Patient remains on Plaquenil 200 mg 1 tablet by mouth BID M-F.  She is tolerating Plaquenil without any side effects and has not missed any doses recently.  She denies any signs or symptoms of a rheumatoid arthritis flare.  She continues to experience intermittent arthralgias especially with cooler weather temperatures.  Her discomfort has been most consistent in her lower back.  She denies any symptoms of radiculopathy.  Patient states that she has noticed over a 50% improvement in her symptoms since initiating Plaquenil.     Activities of Daily Living:  Patient reports morning stiffness for 10 minutes  Patient Denies nocturnal pain.  Difficulty dressing/grooming: Denies Difficulty climbing stairs: Denies Difficulty getting out of chair: Denies Difficulty using hands for taps, buttons, cutlery, and/or writing: Denies  Review of Systems  Constitutional:  Positive for fatigue.  HENT:  Positive for mouth dryness. Negative for mouth sores and nose dryness.   Eyes:  Positive for dryness. Negative for pain and visual disturbance.  Respiratory:  Negative for cough, hemoptysis, shortness of breath and difficulty breathing.   Cardiovascular:  Negative for chest pain, palpitations, hypertension and swelling in legs/feet.  Gastrointestinal:  Negative for blood in stool, constipation and diarrhea.  Endocrine: Negative for increased urination.  Genitourinary:  Negative for painful urination.  Musculoskeletal:  Positive for joint pain, joint pain and morning stiffness. Negative for joint swelling, myalgias, muscle  weakness, muscle tenderness and myalgias.  Skin:  Negative for color change, pallor, rash, hair loss, nodules/bumps, skin tightness, ulcers and sensitivity to sunlight.  Allergic/Immunologic: Negative for susceptible to infections.  Neurological:  Positive for dizziness. Negative for numbness, headaches and weakness.  Hematological:  Negative for swollen glands.  Psychiatric/Behavioral:  Positive for sleep disturbance. Negative for depressed mood. The patient is not nervous/anxious.     PMFS History:  Patient Active Problem List   Diagnosis Date Noted   Vulvar ulceration 06/18/2020   Itching in the vaginal area 06/18/2020    Past Medical History:  Diagnosis Date   COPD (chronic obstructive pulmonary disease) (HCC)    Fibromyalgia    GAD (generalized anxiety disorder)    GERD (gastroesophageal reflux disease)    Hypertension    Hypothyroidism    Other intervertebral disc degeneration, lumbar region    Sleep apnea     Family History  Problem Relation Age of Onset   Diabetes Mother    Heart disease Mother    Diabetes Father    Breast cancer Paternal Grandmother    Asthma Daughter    Fibroids Daughter    Rheum arthritis Daughter    Cancer Daughter    Asthma Son    Psoriasis Son    Past Surgical History:  Procedure Laterality Date   CARPAL TUNNEL RELEASE Right 08/05/2022   Procedure: RIGHT CARPAL TUNNEL RELEASE;  Surgeon: Betha Loa, MD;  Location: [facility];  Service: Orthopedics;  Laterality: Right;   CHOLECYSTECTOMY     DILATION AND CURETTAGE OF UTERUS  LAPAROSCOPIC ASSISTED VAGINAL HYSTERECTOMY     SPINAL CORD STIMULATOR INSERTION     stomach stapling  1980   Social History   Social History Narrative   Not on file    There is no immunization history on file for this patient.   Objective: Vital Signs: BP (!) 91/57 (BP Location: Right Arm, Patient Position: Sitting, Cuff Size: Normal)   Pulse 71   Resp 16   Ht 5\' 1"  (1.549 m)   Wt 158 lb  (71.7 kg)   BMI 29.85 kg/m    Physical Exam Vitals and nursing note reviewed.  Constitutional:      Appearance: She is well-developed.  HENT:     Head: Normocephalic and atraumatic.  Eyes:     Conjunctiva/sclera: Conjunctivae normal.  Cardiovascular:     Rate and Rhythm: Normal rate and regular rhythm.     Heart sounds: Normal heart sounds.  Pulmonary:     Effort: Pulmonary effort is normal.     Breath sounds: Normal breath sounds.  Abdominal:     General: Bowel sounds are normal.     Palpations: Abdomen is soft.  Musculoskeletal:     Cervical back: Normal range of motion.  Lymphadenopathy:     Cervical: No cervical adenopathy.  Skin:    General: Skin is warm and dry.     Capillary Refill: Capillary refill takes less than 2 seconds.  Neurological:     Mental Status: She is alert and oriented to person, place, and time.  Psychiatric:        Behavior: Behavior normal.      Musculoskeletal Exam: C-spine has limited motion.  Limited mobility of the thoracic and lumbar spine.  Shoulder joint stiffness with range of motion.  Elbow joints, wrist joints, MCPs, PIPs, DIPs have good range of motion with no synovitis.  PIP and DIP thickening noted.  Hip joints have good range of motion.  Bilateral knee crepitus noted.  No warmth or effusion of knee joints noted.  Ankle joints have good range of motion with no synovitis.  CDAI Exam: CDAI Score: -- Patient Global: 30 / 100; Provider Global: 30 / 100 Swollen: --; Tender: -- Joint Exam 11/13/2022   No joint exam has been documented for this visit   There is currently no information documented on the homunculus. Go to the Rheumatology activity and complete the homunculus joint exam.  Investigation: No additional findings.  Imaging: MM 3D SCREENING MAMMOGRAM BILATERAL BREAST  Result Date: 10/31/2022 CLINICAL DATA:  Screening. EXAM: DIGITAL SCREENING BILATERAL MAMMOGRAM WITH TOMOSYNTHESIS AND CAD TECHNIQUE: Bilateral screening  digital craniocaudal and mediolateral oblique mammograms were obtained. Bilateral screening digital breast tomosynthesis was performed. The images were evaluated with computer-aided detection. COMPARISON:  Previous exam(s). ACR Breast Density Category b: There are scattered areas of fibroglandular density. FINDINGS: There are no findings suspicious for malignancy. IMPRESSION: No mammographic evidence of malignancy. A result letter of this screening mammogram will be mailed directly to the patient. RECOMMENDATION: Screening mammogram in one year. (Code:SM-B-01Y) BI-RADS CATEGORY  1: Negative. Electronically Signed   By: Harmon Pier M.D.   On: 10/31/2022 17:23    Recent Labs: Lab Results  Component Value Date   WBC 6.4 08/14/2022   HGB 13.3 08/14/2022   PLT 175 08/14/2022   NA 143 08/14/2022   K 4.5 08/14/2022   CL 101 08/14/2022   CO2 33 (H) 08/14/2022   GLUCOSE 103 (H) 08/14/2022   BUN 15 08/14/2022   CREATININE 0.68 08/14/2022  BILITOT 0.4 08/14/2022   ALKPHOS 56 05/09/2022   AST 27 08/14/2022   ALT 19 08/14/2022   PROT 6.3 08/14/2022   ALBUMIN 4.1 05/09/2022   CALCIUM 9.5 08/14/2022    Speciality Comments: PLQ eye exam normal  04/08/2022 [facility] f/u 12 months  Procedures:  No procedures performed Allergies: Penicillin g   Assessment / Plan:     Visit Diagnoses: Rheumatoid arthritis with rheumatoid factor of multiple sites without organ or systems involvement (HCC) - Rheumatoid factor was 27, anti-CCP negative, ESR 36, CRP within normal limits on 03/05/2022: She has no synovitis on examination today.  She has not had any signs or symptoms of a rheumatoid arthritis flare.  She has clinically been doing well taking Plaquenil 200 mg 1 tablet by mouth twice daily Monday through Friday.  She is tolerating Plaquenil without any side effects and has not missed any doses recently.  She has noticed over 50% improvement in her joint pain and inflammation since initiating  Plaquenil.  She does not require more aggressive therapy at this time.  She will remain on Plaquenil as monotherapy.  She was vies notify us if she develops signs or symptoms of a flare.  She will follow-up in the office in 5 months or sooner if needed.  High risk medication use - Plaquenil 200 mg 1 tablet by mouth BID M-F.  PLQ eye exam normal 04/08/2022 [facility] f/u 12 months  CBC and CMP updated on 08/14/22.  Orders for CBC and CMP were released today. Her next lab work will be due in 5 months. - Plan: COMPLETE METABOLIC PANEL WITH GFR, CBC with Differential/Platelet  Primary osteoarthritis of both hands: PIP and DIP thickening consistent with osteoarthritis of both hands.  No synovitis noted on examination today.  Primary osteoarthritis of left hip: Slightly limited range of motion with discomfort.  Tenderness over the left trochanteric bursa.  Patient was given a handout of exercises to perform.  Chondrocalcinosis - XR 03/05/22: Mild chondromalacia patella and chondrocalcinosis.  No signs or symptoms of a pseudogout flare.  No warmth or effusion noted today.  Chondromalacia of both patellae: Mild knee crepitus noted.  No warmth or effusion noted.  Primary osteoarthritis of both feet - No erosive changes noted.  Ankle joints have good range of motion with no tenderness or joint swelling.  Positive ANA (antinuclear antibody) - 03/05/22: ANA 1:40 cytoplasmic, 1:320Nuclear, nucleolar.  Double-stranded ENA negative, complements within normal limits, Smith antibody negative.  No clinical features of systemic lupus.  Fibromyalgia - Under the care of Dr. Vear Clock. She remains on Cymbalta 60 mg 1 capsule by mouth daily, Lyrica 300 mg twice daily, and tizanidine 4 mg 3 times daily.  Bilateral carpal tunnel syndrome - Patient underwent right carpal tunnel release surgery on 08/05/22. Patient is planning to proceed with left carpal tunnel release in the future but has not currently have  a surgery date scheduled.  Chronic pain syndrome - She remains on Lyrica, Cymbalta, and tizanidine as prescribed.  She also takes morphine 30 mg every 12 hours for pain relief.  History of osteoporosis -She remains on Fosamax 70 mg 1 tablet by mouth once weekly.  Other medical conditions are listed as follows:  History of hyperlipidemia  Vitamin D deficiency  History of hypothyroidism  History of COPD  Essential hypertension: Blood pressure was 91/57 today in the office.  Prediabetes  Orders: Orders Placed This Encounter  Procedures   COMPLETE METABOLIC PANEL WITH GFR  CBC with Differential/Platelet   No orders of the defined types were placed in this encounter.    Follow-Up Instructions: Return in about 5 months (around 04/15/2023) for Rheumatoid arthritis, Osteoarthritis.   Gearldine Bienenstock, PA-C  Note - This record has been created using Dragon software.  Chart creation errors have been sought, but may not always  have been located. Such creation errors do not reflect on  the standard of medical care.

## 2022-11-13 ENCOUNTER — Encounter: Payer: Self-pay | Admitting: Physician Assistant

## 2022-11-13 ENCOUNTER — Ambulatory Visit: Payer: 59 | Attending: Physician Assistant | Admitting: Physician Assistant

## 2022-11-13 VITALS — BP 91/57 | HR 71 | Resp 16 | Ht 61.0 in | Wt 158.0 lb

## 2022-11-13 DIAGNOSIS — G894 Chronic pain syndrome: Secondary | ICD-10-CM | POA: Diagnosis not present

## 2022-11-13 DIAGNOSIS — M19041 Primary osteoarthritis, right hand: Secondary | ICD-10-CM | POA: Diagnosis not present

## 2022-11-13 DIAGNOSIS — M19071 Primary osteoarthritis, right ankle and foot: Secondary | ICD-10-CM | POA: Diagnosis not present

## 2022-11-13 DIAGNOSIS — M797 Fibromyalgia: Secondary | ICD-10-CM | POA: Diagnosis not present

## 2022-11-13 DIAGNOSIS — I10 Essential (primary) hypertension: Secondary | ICD-10-CM

## 2022-11-13 DIAGNOSIS — Z8709 Personal history of other diseases of the respiratory system: Secondary | ICD-10-CM

## 2022-11-13 DIAGNOSIS — E559 Vitamin D deficiency, unspecified: Secondary | ICD-10-CM

## 2022-11-13 DIAGNOSIS — M1612 Unilateral primary osteoarthritis, left hip: Secondary | ICD-10-CM

## 2022-11-13 DIAGNOSIS — M19042 Primary osteoarthritis, left hand: Secondary | ICD-10-CM

## 2022-11-13 DIAGNOSIS — Z8639 Personal history of other endocrine, nutritional and metabolic disease: Secondary | ICD-10-CM

## 2022-11-13 DIAGNOSIS — G5603 Carpal tunnel syndrome, bilateral upper limbs: Secondary | ICD-10-CM | POA: Diagnosis not present

## 2022-11-13 DIAGNOSIS — M1120 Other chondrocalcinosis, unspecified site: Secondary | ICD-10-CM | POA: Diagnosis not present

## 2022-11-13 DIAGNOSIS — M0579 Rheumatoid arthritis with rheumatoid factor of multiple sites without organ or systems involvement: Secondary | ICD-10-CM | POA: Diagnosis not present

## 2022-11-13 DIAGNOSIS — Z8739 Personal history of other diseases of the musculoskeletal system and connective tissue: Secondary | ICD-10-CM | POA: Diagnosis not present

## 2022-11-13 DIAGNOSIS — M2241 Chondromalacia patellae, right knee: Secondary | ICD-10-CM | POA: Diagnosis not present

## 2022-11-13 DIAGNOSIS — R768 Other specified abnormal immunological findings in serum: Secondary | ICD-10-CM

## 2022-11-13 DIAGNOSIS — M2242 Chondromalacia patellae, left knee: Secondary | ICD-10-CM

## 2022-11-13 DIAGNOSIS — M19072 Primary osteoarthritis, left ankle and foot: Secondary | ICD-10-CM

## 2022-11-13 DIAGNOSIS — Z79899 Other long term (current) drug therapy: Secondary | ICD-10-CM | POA: Diagnosis not present

## 2022-11-13 DIAGNOSIS — R7303 Prediabetes: Secondary | ICD-10-CM

## 2022-11-13 NOTE — Patient Instructions (Signed)
Hip Bursitis Rehab Ask your health care provider which exercises are safe for you. Do exercises exactly as told by your health care provider and adjust them as directed. It is normal to feel mild stretching, pulling, tightness, or discomfort as you do these exercises. Stop right away if you feel sudden pain or your pain gets worse. Do not begin these exercises until told by your health care provider. Stretching exercise This exercise warms up your muscles and joints and improves the movement and flexibility of your hip. This exercise also helps to relieve pain and stiffness. Iliotibial band stretch An iliotibial band is a strong band of muscle tissue that runs from the outer side of your hip to the outer side of your thigh and knee. Lie on your side with your left / right leg in the top position. Bend your left / right knee and grab your ankle. Stretch out your bottom arm to help you balance. Slowly bring your knee back so your thigh is slightly behind your body. Slowly lower your knee toward the floor until you feel a gentle stretch on the outside of your left / right thigh. If you do not feel a stretch and your knee will not lower more toward the floor, place the heel of your other foot on top of your knee and pull your knee down toward the floor with your foot. Hold this position for __________ seconds. Slowly return to the starting position. Repeat __________ times. Complete this exercise __________ times a day. Strengthening exercises These exercises build strength and endurance in your hip and pelvis. Endurance is the ability to use your muscles for a long time, even after they get tired. Bridge This exercise strengthens the muscles that move your thigh backward (hip extensors). Lie on your back on a firm surface with your knees bent and your feet flat on the floor. Tighten your buttocks muscles and lift your buttocks off the floor until your trunk is level with your thighs. Do not arch your  back. You should feel the muscles working in your buttocks and the back of your thighs. If you do not feel these muscles, slide your feet 1-2 inches (2.5-5 cm) farther away from your buttocks. If this exercise is too easy, try doing it with your arms crossed over your chest. Hold this position for __________ seconds. Slowly lower your hips to the starting position. Let your muscles relax completely after each repetition. Repeat __________ times. Complete this exercise __________ times a day. Squats This exercise strengthens the muscles in front of your thigh and knee (quadriceps). Stand in front of a table, with your feet and knees pointing straight ahead. You may rest your hands on the table for balance but not for support. Slowly bend your knees and lower your hips like you are going to sit in a chair. Keep your weight over your heels, not over your toes. Keep your lower legs upright so they are parallel with the table legs. Do not let your hips go lower than your knees. Do not bend lower than told by your health care provider. If your hip pain increases, do not bend as low. Hold the squat position for __________ seconds. Slowly push with your legs to return to standing. Do not use your hands to pull yourself to standing. Repeat __________ times. Complete this exercise __________ times a day. Hip hike  Stand sideways on a bottom step. Stand on your left / right leg with your other foot unsupported next to  the step. You can hold on to the railing or wall for balance if needed. Keep your knees straight and your torso square. Then lift your left / right hip up toward the ceiling. Hold this position for __________ seconds. Slowly let your left / right hip lower toward the floor, past the starting position. Your foot should get closer to the floor. Do not lean or bend your knees. Repeat __________ times. Complete this exercise __________ times a day. Single leg stand This exercise increases  your balance. Without shoes, stand near a railing or in a doorway. You may hold on to the railing or door frame as needed for balance. Squeeze your left / right buttock muscles, then lift up your other foot. Do not let your left / right hip push out to the side. It is helpful to stand in front of a mirror for this exercise so you can watch your hip. Hold this position for __________ seconds. Repeat __________ times. Complete this exercise __________ times a day. This information is not intended to replace advice given to you by your health care provider. Make sure you discuss any questions you have with your health care provider. Document Revised: 01/23/2021 Document Reviewed: 01/23/2021 Elsevier Patient Education  2024 [employer].

## 2022-11-14 LAB — COMPLETE METABOLIC PANEL WITH GFR
AG Ratio: 1.8 (calc) (ref 1.0–2.5)
ALT: 24 U/L (ref 6–29)
AST: 20 U/L (ref 10–35)
Albumin: 4 g/dL (ref 3.6–5.1)
Alkaline phosphatase (APISO): 62 U/L (ref 37–153)
BUN: 19 mg/dL (ref 7–25)
CO2: 30 mmol/L (ref 20–32)
Calcium: 8.7 mg/dL (ref 8.6–10.4)
Chloride: 104 mmol/L (ref 98–110)
Creat: 0.71 mg/dL (ref 0.50–1.05)
Globulin: 2.2 g/dL (calc) (ref 1.9–3.7)
Glucose, Bld: 103 mg/dL — ABNORMAL HIGH (ref 65–99)
Potassium: 4.7 mmol/L (ref 3.5–5.3)
Sodium: 142 mmol/L (ref 135–146)
Total Bilirubin: 0.4 mg/dL (ref 0.2–1.2)
Total Protein: 6.2 g/dL (ref 6.1–8.1)
eGFR: 92 mL/min/{1.73_m2} (ref >=60)

## 2022-11-14 LAB — CBC WITH DIFFERENTIAL/PLATELET
Absolute Monocytes: 451 cells/uL (ref 200–950)
Basophils Absolute: 22 cells/uL (ref 0–200)
Basophils Relative: 0.4 %
Eosinophils Absolute: 198 cells/uL (ref 15–500)
Eosinophils Relative: 3.6 %
HCT: 39.7 % (ref 35.0–45.0)
Hemoglobin: 12.2 g/dL (ref 11.7–15.5)
Lymphs Abs: 1040 cells/uL (ref 850–3900)
MCH: 25.4 pg — ABNORMAL LOW (ref 27.0–33.0)
MCHC: 30.7 g/dL — ABNORMAL LOW (ref 32.0–36.0)
MCV: 82.5 fL (ref 80.0–100.0)
MPV: 11.8 fL (ref 7.5–12.5)
Monocytes Relative: 8.2 %
Neutro Abs: 3790 cells/uL (ref 1500–7800)
Neutrophils Relative %: 68.9 %
Platelets: 175 10*3/uL (ref 140–400)
RBC: 4.81 10*6/uL (ref 3.80–5.10)
RDW: 14.4 % (ref 11.0–15.0)
Total Lymphocyte: 18.9 %
WBC: 5.5 10*3/uL (ref 3.8–10.8)

## 2022-11-14 NOTE — Progress Notes (Signed)
CBC and CMP are normal.

## 2022-11-26 DIAGNOSIS — Z79891 Long term (current) use of opiate analgesic: Secondary | ICD-10-CM | POA: Diagnosis not present

## 2022-11-26 DIAGNOSIS — G894 Chronic pain syndrome: Secondary | ICD-10-CM | POA: Diagnosis not present

## 2022-12-09 ENCOUNTER — Other Ambulatory Visit: Payer: Self-pay | Admitting: Physician Assistant

## 2022-12-10 NOTE — Telephone Encounter (Signed)
Last Fill: 09/18/2022  Eye exam: 04/08/2022 WNL   Labs: 11/13/2022 CBC and CMP are normal.   Next Visit: 04/15/2022  Last Visit: 11/13/2022  DX: Rheumatoid arthritis with rheumatoid factor of multiple sites without organ or systems involvement   Current Dose per office note 11/13/2022: Plaquenil 200 mg 1 tablet by mouth BID M-F   Okay to refill Plaquenil?

## 2023-01-13 DIAGNOSIS — G894 Chronic pain syndrome: Secondary | ICD-10-CM | POA: Diagnosis not present

## 2023-01-13 DIAGNOSIS — M4726 Other spondylosis with radiculopathy, lumbar region: Secondary | ICD-10-CM | POA: Diagnosis not present

## 2023-01-13 DIAGNOSIS — M4722 Other spondylosis with radiculopathy, cervical region: Secondary | ICD-10-CM | POA: Diagnosis not present

## 2023-01-13 DIAGNOSIS — G5603 Carpal tunnel syndrome, bilateral upper limbs: Secondary | ICD-10-CM | POA: Diagnosis not present

## 2023-01-14 DIAGNOSIS — I10 Essential (primary) hypertension: Secondary | ICD-10-CM | POA: Diagnosis not present

## 2023-01-14 DIAGNOSIS — E039 Hypothyroidism, unspecified: Secondary | ICD-10-CM | POA: Diagnosis not present

## 2023-01-14 DIAGNOSIS — R7303 Prediabetes: Secondary | ICD-10-CM | POA: Diagnosis not present

## 2023-01-14 DIAGNOSIS — E559 Vitamin D deficiency, unspecified: Secondary | ICD-10-CM | POA: Diagnosis not present

## 2023-01-15 LAB — LAB REPORT - SCANNED
A1c: 5.8
Albumin, Urine POC: 3
Albumin/Creatinine Ratio, Urine, POC: 35.3
EGFR: 95
Microalb Creat Ratio: 8

## 2023-01-20 DIAGNOSIS — I10 Essential (primary) hypertension: Secondary | ICD-10-CM | POA: Diagnosis not present

## 2023-01-20 DIAGNOSIS — J449 Chronic obstructive pulmonary disease, unspecified: Secondary | ICD-10-CM | POA: Diagnosis not present

## 2023-01-20 DIAGNOSIS — M797 Fibromyalgia: Secondary | ICD-10-CM | POA: Diagnosis not present

## 2023-01-20 DIAGNOSIS — M545 Low back pain, unspecified: Secondary | ICD-10-CM | POA: Diagnosis not present

## 2023-01-20 DIAGNOSIS — M069 Rheumatoid arthritis, unspecified: Secondary | ICD-10-CM | POA: Diagnosis not present

## 2023-01-20 DIAGNOSIS — E039 Hypothyroidism, unspecified: Secondary | ICD-10-CM | POA: Diagnosis not present

## 2023-01-20 DIAGNOSIS — M159 Polyosteoarthritis, unspecified: Secondary | ICD-10-CM | POA: Diagnosis not present

## 2023-01-20 DIAGNOSIS — E559 Vitamin D deficiency, unspecified: Secondary | ICD-10-CM | POA: Diagnosis not present

## 2023-01-20 DIAGNOSIS — E785 Hyperlipidemia, unspecified: Secondary | ICD-10-CM | POA: Diagnosis not present

## 2023-01-20 DIAGNOSIS — R7303 Prediabetes: Secondary | ICD-10-CM | POA: Diagnosis not present

## 2023-01-20 DIAGNOSIS — M810 Age-related osteoporosis without current pathological fracture: Secondary | ICD-10-CM | POA: Diagnosis not present

## 2023-01-20 DIAGNOSIS — G894 Chronic pain syndrome: Secondary | ICD-10-CM | POA: Diagnosis not present

## 2023-01-21 ENCOUNTER — Telehealth: Payer: Self-pay | Admitting: *Deleted

## 2023-01-21 NOTE — Telephone Encounter (Signed)
Labs received from:Dr. Dwana Melena  Drawn on:01/14/2023  Reviewed by: Sherron Ales, PA-C  Labs drawn:Vitamin D, Hgb A1C, Albumin/Creat. Ratio, Lipid Panel, CMP, CBC, TSH+Free T4  Results: Hgb A1C 5.8   Cholesterol, Total 208   LDL CHOL CALC 117   Sodium 145   Protein, Total 5.9   MCH 24.8   MCHC 30.9   TSH 4.760   T4, Free 0.79   Patient is on PLQ 200 mg po BID M-F.

## 2023-03-02 ENCOUNTER — Other Ambulatory Visit: Payer: Self-pay | Admitting: Physician Assistant

## 2023-03-02 NOTE — Telephone Encounter (Signed)
 Last Fill: 12/10/2022  Eye exam: normal  04/08/2022    Labs: 01/14/2023 Hgb A1C 5.8   Cholesterol, Total 208   LDL CHOL CALC 117   Sodium 145   Protein, Total 5.9   MCH 24.8   MCHC 30.9   TSH 4.760   T4, Free 0.79  Next Visit: 04/16/2023  Last Visit: 11/13/2022  IK:Myzlfjunpi arthritis with rheumatoid factor of multiple sites without organ or systems involvement   Current Dose per office note 11/13/2022: Plaquenil  200 mg 1 tablet by mouth BID M-F   Okay to refill Plaquenil ?

## 2023-03-09 DIAGNOSIS — E039 Hypothyroidism, unspecified: Secondary | ICD-10-CM | POA: Diagnosis not present

## 2023-03-11 DIAGNOSIS — M4722 Other spondylosis with radiculopathy, cervical region: Secondary | ICD-10-CM | POA: Diagnosis not present

## 2023-03-11 DIAGNOSIS — G894 Chronic pain syndrome: Secondary | ICD-10-CM | POA: Diagnosis not present

## 2023-03-11 DIAGNOSIS — M4726 Other spondylosis with radiculopathy, lumbar region: Secondary | ICD-10-CM | POA: Diagnosis not present

## 2023-03-11 DIAGNOSIS — G5603 Carpal tunnel syndrome, bilateral upper limbs: Secondary | ICD-10-CM | POA: Diagnosis not present

## 2023-03-12 IMAGING — MG MM DIGITAL SCREENING BILAT W/ TOMO AND CAD
6 of 10 series · 6 of 30 positions shown · non-contrast
Comparison: Previous exam(s).

CLINICAL DATA: Screening.

EXAM:
DIGITAL SCREENING BILATERAL MAMMOGRAM WITH TOMOSYNTHESIS AND CAD
TECHNIQUE: Bilateral screening digital craniocaudal and mediolateral oblique
mammograms were obtained. Bilateral screening digital breast
tomosynthesis was performed. The images were evaluated with
computer-aided detection.

[L MLO synth-2D]
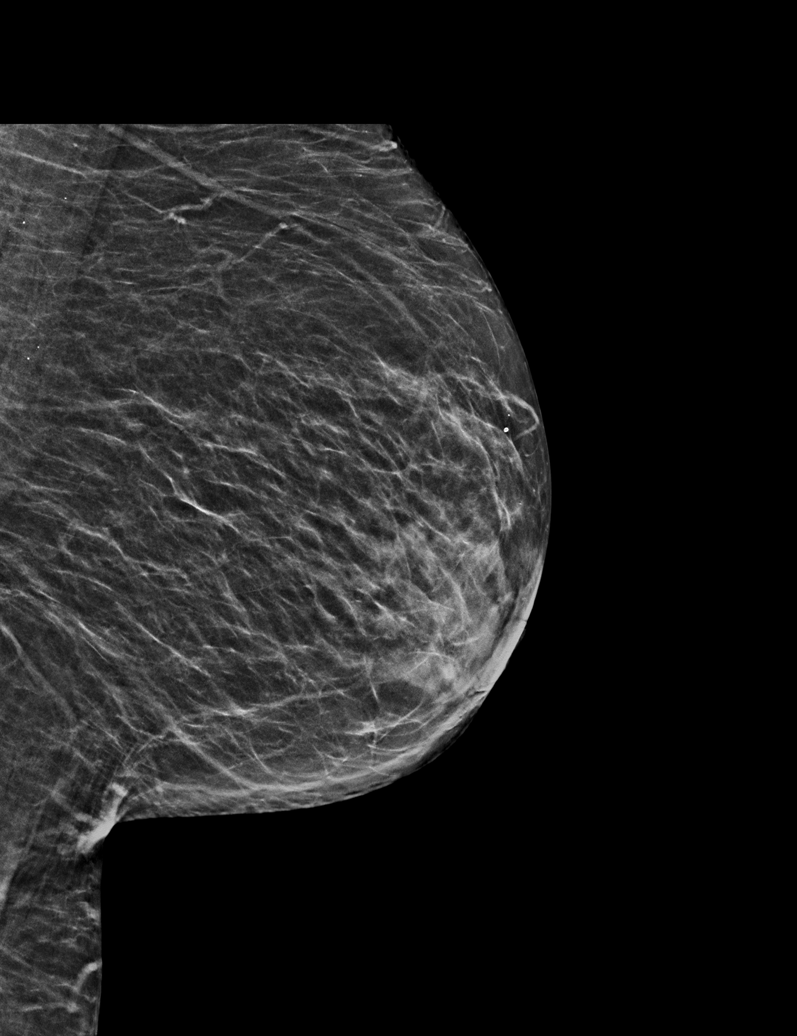

[R CC synth-2D (1 of 2)]
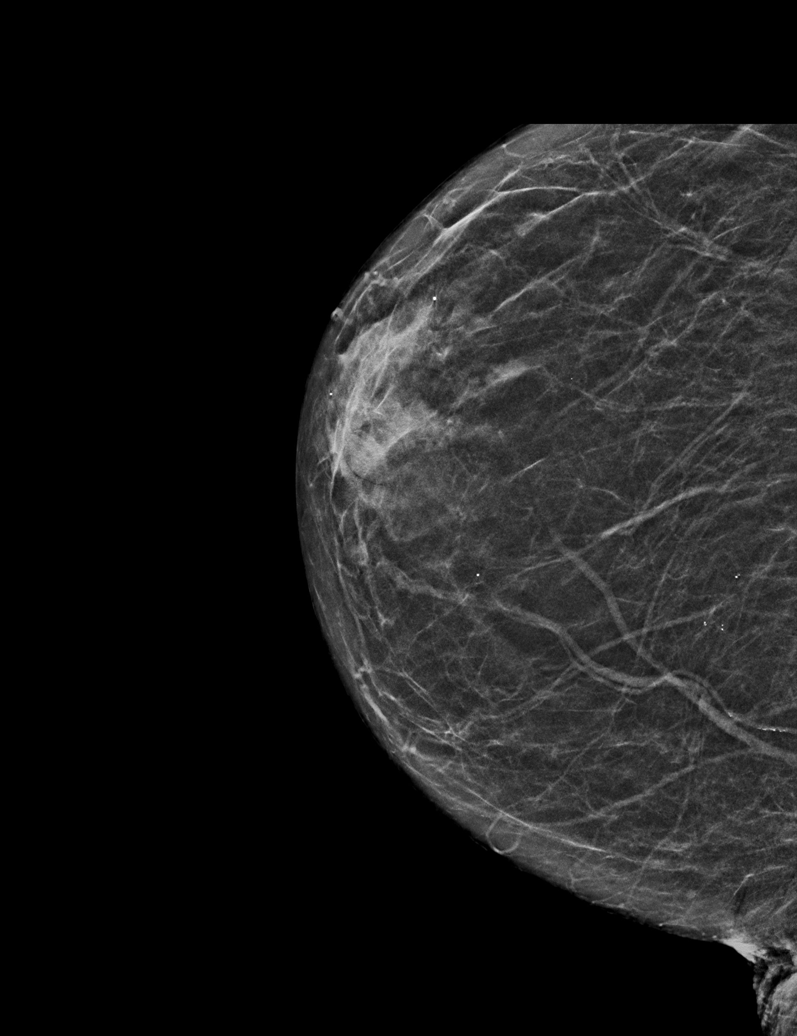

[R CC synth-2D (2 of 2)]
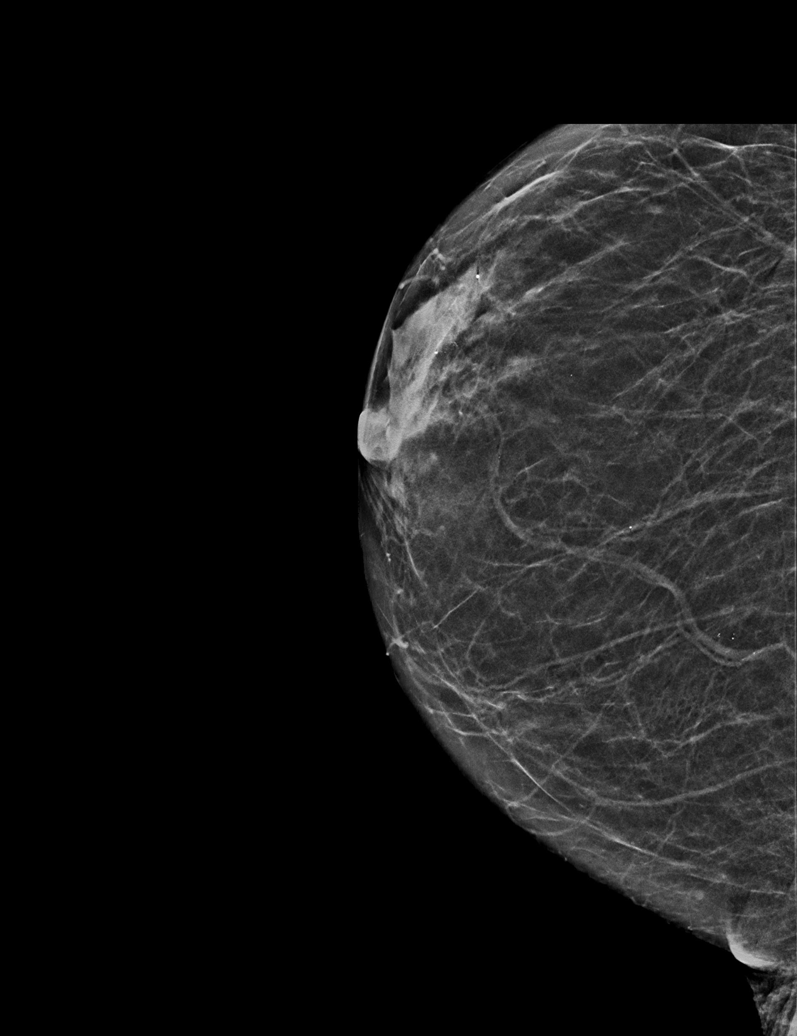

[R MLO synth-2D]
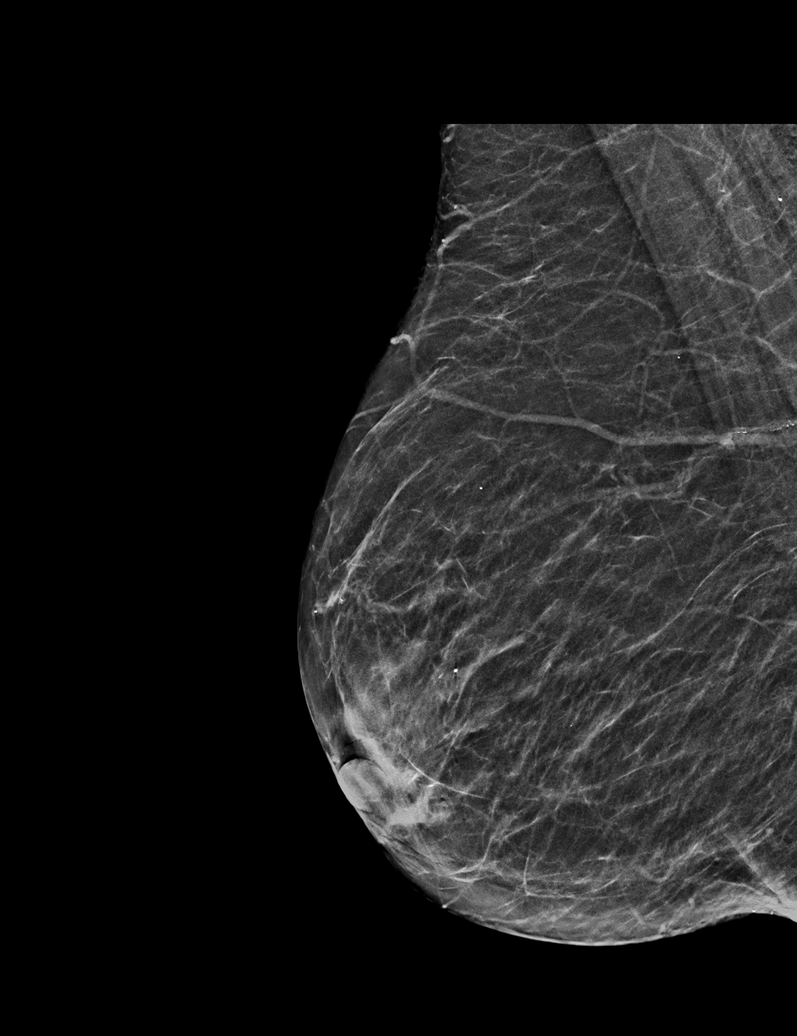

[L CC synth-2D]
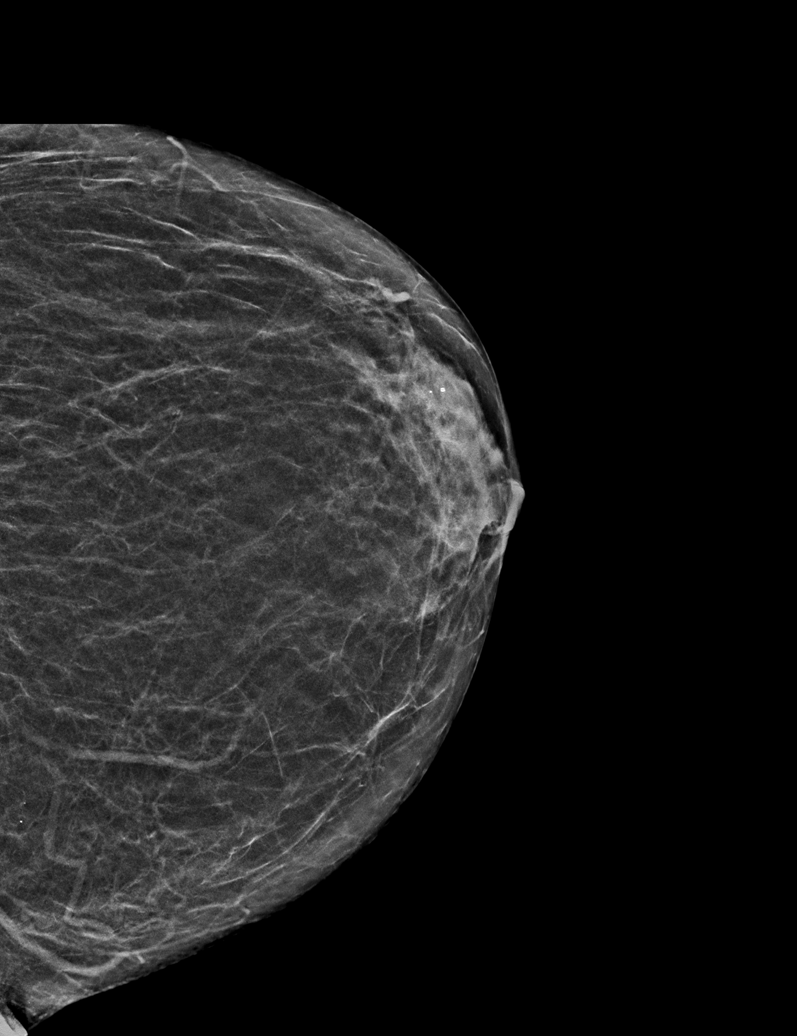

[L CC tomo · tomo slice 23/44.0]
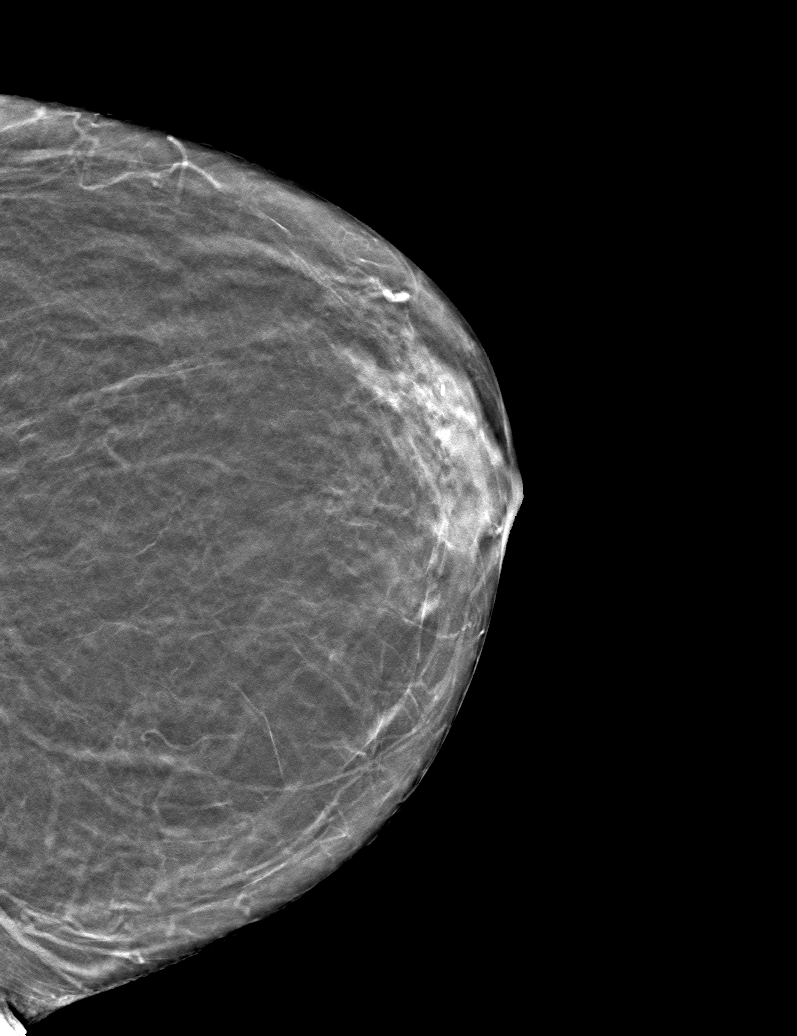

[6 of 30 positions shown; findings below may reference images not displayed]

ACR Breast Density Category b: There are scattered areas of
fibroglandular density.
FINDINGS: There are no findings suspicious for malignancy. The images were
evaluated with computer-aided detection.
IMPRESSION: No mammographic evidence of malignancy. A result letter of this
screening mammogram will be mailed directly to the patient.

RECOMMENDATION:
Screening mammogram in one year. (Code:[id number])

BI-RADS CATEGORY  1: Negative.

## 2023-04-02 NOTE — Progress Notes (Deleted)
 Office Visit Note  Patient: Dawn Hughes             Date of Birth: 02/24/1956           MRN: [id number]             PCP: Benita Stabile, MD Referring: Benita Stabile, MD Visit Date: 04/16/2023 Occupation: @GUAROCC @  Subjective:  No chief complaint on file.   History of Present Illness: Dawn Hughes is a 68 y.o. female ***     Activities of Daily Living:  Patient reports morning stiffness for *** {minute/hour:19697}.   Patient {ACTIONS;DENIES/REPORTS:21021675::"Denies"} nocturnal pain.  Difficulty dressing/grooming: {ACTIONS;DENIES/REPORTS:21021675::"Denies"} Difficulty climbing stairs: {ACTIONS;DENIES/REPORTS:21021675::"Denies"} Difficulty getting out of chair: {ACTIONS;DENIES/REPORTS:21021675::"Denies"} Difficulty using hands for taps, buttons, cutlery, and/or writing: {ACTIONS;DENIES/REPORTS:21021675::"Denies"}  No Rheumatology ROS completed.   PMFS History:  Patient Active Problem List   Diagnosis Date Noted   Vulvar ulceration 06/18/2020   Itching in the vaginal area 06/18/2020    Past Medical History:  Diagnosis Date   COPD (chronic obstructive pulmonary disease) (HCC)    Fibromyalgia    GAD (generalized anxiety disorder)    GERD (gastroesophageal reflux disease)    Hypertension    Hypothyroidism    Other intervertebral disc degeneration, lumbar region    Sleep apnea     Family History  Problem Relation Age of Onset   Diabetes Mother    Heart disease Mother    Diabetes Father    Breast cancer Paternal Grandmother    Asthma Daughter    Fibroids Daughter    Rheum arthritis Daughter    Cancer Daughter    Asthma Son    Psoriasis Son    Past Surgical History:  Procedure Laterality Date   CARPAL TUNNEL RELEASE Right 08/05/2022   Procedure: RIGHT CARPAL TUNNEL RELEASE;  Surgeon: Betha Loa, MD;  Location: [facility];  Service: Orthopedics;  Laterality: Right;   CHOLECYSTECTOMY     DILATION AND CURETTAGE OF UTERUS     LAPAROSCOPIC  ASSISTED VAGINAL HYSTERECTOMY     SPINAL CORD STIMULATOR INSERTION     stomach stapling  1980   Social History   Social History Narrative   Not on file    There is no immunization history on file for this patient.   Objective: Vital Signs: There were no vitals taken for this visit.   Physical Exam   Musculoskeletal Exam: ***  CDAI Exam: CDAI Score: -- Patient Global: --; Provider Global: -- Swollen: --; Tender: -- Joint Exam 04/16/2023   No joint exam has been documented for this visit   There is currently no information documented on the homunculus. Go to the Rheumatology activity and complete the homunculus joint exam.  Investigation: No additional findings.  Imaging: No results found.  Recent Labs: Lab Results  Component Value Date   WBC 5.5 11/13/2022   HGB 12.2 11/13/2022   PLT 175 11/13/2022   NA 142 11/13/2022   K 4.7 11/13/2022   CL 104 11/13/2022   CO2 30 11/13/2022   GLUCOSE 103 (H) 11/13/2022   BUN 19 11/13/2022   CREATININE 0.71 11/13/2022   BILITOT 0.4 11/13/2022   ALKPHOS 56 05/09/2022   AST 20 11/13/2022   ALT 24 11/13/2022   PROT 6.2 11/13/2022   ALBUMIN 4.1 05/09/2022   CALCIUM 8.7 11/13/2022    Speciality Comments: PLQ eye exam normal  04/08/2022 [facility] Care f/u 12 months  Procedures:  No procedures performed Allergies: Penicillin g   Assessment /  Plan:     Visit Diagnoses: No diagnosis found.  Orders: No orders of the defined types were placed in this encounter.  No orders of the defined types were placed in this encounter.   Face-to-face time spent with patient was *** minutes. Greater than 50% of time was spent in counseling and coordination of care.  Follow-Up Instructions: No follow-ups on file.   Ellen Henri, CMA  Note - This record has been created using Animal nutritionist.  Chart creation errors have been sought, but may not always  have been located. Such creation errors do not reflect on   the standard of medical care.

## 2023-04-16 ENCOUNTER — Ambulatory Visit: Payer: 59 | Admitting: Rheumatology

## 2023-04-16 DIAGNOSIS — E559 Vitamin D deficiency, unspecified: Secondary | ICD-10-CM

## 2023-04-16 DIAGNOSIS — Z8639 Personal history of other endocrine, nutritional and metabolic disease: Secondary | ICD-10-CM

## 2023-04-16 DIAGNOSIS — M0579 Rheumatoid arthritis with rheumatoid factor of multiple sites without organ or systems involvement: Secondary | ICD-10-CM

## 2023-04-16 DIAGNOSIS — R7303 Prediabetes: Secondary | ICD-10-CM

## 2023-04-16 DIAGNOSIS — R768 Other specified abnormal immunological findings in serum: Secondary | ICD-10-CM

## 2023-04-16 DIAGNOSIS — I10 Essential (primary) hypertension: Secondary | ICD-10-CM

## 2023-04-16 DIAGNOSIS — M1120 Other chondrocalcinosis, unspecified site: Secondary | ICD-10-CM

## 2023-04-16 DIAGNOSIS — Z8739 Personal history of other diseases of the musculoskeletal system and connective tissue: Secondary | ICD-10-CM

## 2023-04-16 DIAGNOSIS — G5603 Carpal tunnel syndrome, bilateral upper limbs: Secondary | ICD-10-CM

## 2023-04-16 DIAGNOSIS — M797 Fibromyalgia: Secondary | ICD-10-CM

## 2023-04-16 DIAGNOSIS — M19071 Primary osteoarthritis, right ankle and foot: Secondary | ICD-10-CM

## 2023-04-16 DIAGNOSIS — G894 Chronic pain syndrome: Secondary | ICD-10-CM

## 2023-04-16 DIAGNOSIS — Z8709 Personal history of other diseases of the respiratory system: Secondary | ICD-10-CM

## 2023-04-16 DIAGNOSIS — Z79899 Other long term (current) drug therapy: Secondary | ICD-10-CM

## 2023-04-16 DIAGNOSIS — M2242 Chondromalacia patellae, left knee: Secondary | ICD-10-CM

## 2023-04-16 DIAGNOSIS — M19042 Primary osteoarthritis, left hand: Secondary | ICD-10-CM

## 2023-04-16 DIAGNOSIS — M1612 Unilateral primary osteoarthritis, left hip: Secondary | ICD-10-CM

## 2023-04-30 ENCOUNTER — Encounter (INDEPENDENT_AMBULATORY_CARE_PROVIDER_SITE_OTHER): Payer: Self-pay | Admitting: *Deleted

## 2023-05-05 NOTE — Progress Notes (Unsigned)
 Office Visit Note  Patient: Dawn Hughes             Date of Birth: [birth year]           MRN: [id number]             PCP: Benita Stabile, MD Referring: Benita Stabile, MD Visit Date: 05/06/2023 Occupation: @GUAROCC @  Subjective:  Pain in multiple joints  History of Present Illness: Dawn Hughes is a 68 y.o. female with seropositive rheumatoid arthritis and osteoarthritis.  She returns today after her last visit in September 2024.  She states she has been taking hydroxychloroquine 200 mg daily instead of twice daily Monday to Friday due to tremors from the higher dose.  She continues to have discomfort in her joints especially with the weather change.  She complains of discomfort in her hands, left hip, knees and her feet.  She has not noticed any joint swelling.  She continues to have generalized pain and discomfort from fibromyalgia.  She sees Dr. Vear Clock for fibromyalgia.      Activities of Daily Living:  Patient reports morning stiffness for several hours.   Patient Reports nocturnal pain.  Difficulty dressing/grooming: Denies Difficulty climbing stairs: Denies Difficulty getting out of chair: Reports Difficulty using hands for taps, buttons, cutlery, and/or writing: Reports  Review of Systems  Constitutional:  Positive for fatigue.  HENT:  Positive for mouth dryness. Negative for mouth sores.   Eyes:  Negative for dryness.  Respiratory:  Negative for shortness of breath.        On exertion   Cardiovascular:  Positive for swelling in legs/feet. Negative for chest pain and palpitations.  Gastrointestinal:  Negative for blood in stool, constipation and diarrhea.  Endocrine: Positive for increased urination.  Genitourinary:  Negative for involuntary urination.  Musculoskeletal:  Positive for joint pain, joint pain, joint swelling, myalgias, muscle weakness, morning stiffness and myalgias. Negative for gait problem and muscle tenderness.  Skin:  Positive for hair loss. Negative  for color change, rash and sensitivity to sunlight.  Allergic/Immunologic: Negative for susceptible to infections.  Neurological:  Positive for headaches. Negative for dizziness.  Hematological:  Negative for swollen glands.  Psychiatric/Behavioral:  Positive for depressed mood and sleep disturbance. The patient is nervous/anxious.     PMFS History:  Patient Active Problem List   Diagnosis Date Noted   Rheumatoid arthritis with rheumatoid factor of multiple sites without organ or systems involvement (HCC) 05/06/2023   Primary osteoarthritis of both hands 05/06/2023   Primary osteoarthritis of left hip 05/06/2023   Chondrocalcinosis 05/06/2023   Chondromalacia of both patellae 05/06/2023   Primary osteoarthritis of both feet 05/06/2023   Fibromyalgia 05/06/2023   Chronic pain syndrome 05/06/2023   Vulvar ulceration 06/18/2020   Itching in the vaginal area 06/18/2020    Past Medical History:  Diagnosis Date   COPD (chronic obstructive pulmonary disease) (HCC)    Fibromyalgia    GAD (generalized anxiety disorder)    GERD (gastroesophageal reflux disease)    Hypertension    Hypothyroidism    Other intervertebral disc degeneration, lumbar region    Sleep apnea     Family History  Problem Relation Age of Onset   Diabetes Mother    Heart disease Mother    Diabetes Father    Breast cancer Paternal Grandmother    Asthma Daughter    Fibroids Daughter    Rheum arthritis Daughter    Cancer Daughter    Asthma Son    Psoriasis  Son    Past Surgical History:  Procedure Laterality Date   CARPAL TUNNEL RELEASE Right 08/05/2022   Procedure: RIGHT CARPAL TUNNEL RELEASE;  Surgeon: Betha Loa, MD;  Location: [facility];  Service: Orthopedics;  Laterality: Right;   CHOLECYSTECTOMY     DILATION AND CURETTAGE OF UTERUS     LAPAROSCOPIC ASSISTED VAGINAL HYSTERECTOMY     SPINAL CORD STIMULATOR INSERTION     stomach stapling  1980   Social History   Social History  Narrative   Not on file    There is no immunization history on file for this patient.   Objective: Vital Signs: BP 118/74 (BP Location: Left Arm, Patient Position: Sitting, Cuff Size: Normal)   Pulse 78   Resp 15   Ht 5\' 1"  (1.549 m)   Wt 164 lb 6.4 oz (74.6 kg)   BMI 31.06 kg/m    Physical Exam Vitals and nursing note reviewed.  Constitutional:      Appearance: She is well-developed.  HENT:     Head: Normocephalic and atraumatic.  Eyes:     Conjunctiva/sclera: Conjunctivae normal.  Cardiovascular:     Rate and Rhythm: Normal rate and regular rhythm.     Heart sounds: Normal heart sounds.  Pulmonary:     Effort: Pulmonary effort is normal.     Breath sounds: Normal breath sounds.  Abdominal:     General: Bowel sounds are normal.     Palpations: Abdomen is soft.  Musculoskeletal:     Cervical back: Normal range of motion.  Lymphadenopathy:     Cervical: No cervical adenopathy.  Skin:    General: Skin is warm and dry.     Capillary Refill: Capillary refill takes less than 2 seconds.  Neurological:     Mental Status: She is alert and oriented to person, place, and time.  Psychiatric:        Behavior: Behavior normal.      Musculoskeletal Exam: She had limited lateral rotation of the cervical spine.  Thoracic kyphosis was noted.  She had discomfort range of motion of her lumbar spine which was in limited range of motion.  Shoulder joints were in good range of motion with some discomfort.  Elbow joints, wrist joints, MCPs were in good range of motion with no synovitis.  She had bilateral PIP and DIP thickening.  Hip joints and knee joints were in good range of motion without any warmth swelling or effusion.  There was no tenderness over ankles or MTPs.  CDAI Exam: CDAI Score: -- Patient Global: 20 / 100; Provider Global: 20 / 100 Swollen: --; Tender: -- Joint Exam 05/06/2023   No joint exam has been documented for this visit   There is currently no information  documented on the homunculus. Go to the Rheumatology activity and complete the homunculus joint exam.  Investigation: No additional findings.  Imaging: No results found.  Recent Labs: Lab Results  Component Value Date   WBC 5.5 11/13/2022   HGB 12.2 11/13/2022   PLT 175 11/13/2022   NA 142 11/13/2022   K 4.7 11/13/2022   CL 104 11/13/2022   CO2 30 11/13/2022   GLUCOSE 103 (H) 11/13/2022   BUN 19 11/13/2022   CREATININE 0.71 11/13/2022   BILITOT 0.4 11/13/2022   ALKPHOS 56 05/09/2022   AST 20 11/13/2022   ALT 24 11/13/2022   PROT 6.2 11/13/2022   ALBUMIN 4.1 05/09/2022   CALCIUM 8.7 11/13/2022    Speciality Comments: PLQ eye  exam normal  04/08/2022 [facility] Care f/u 12 months  Procedures:  No procedures performed Allergies: Penicillin g   Assessment / Plan:     Visit Diagnoses: Rheumatoid arthritis with rheumatoid factor of multiple sites without organ or systems involvement (HCC) - Rheumatoid factor was 27, anti-CCP negative, ESR 36, CRP within normal limits on 03/05/2022: She continues to have pain and discomfort in multiple joints.  No synovitis was noted on the examination.  She has been taking hydroxychloroquine 200 mg daily instead of twice daily Monday to Friday due to increased tremors on the higher dose per patient.  Based on her examination I advised her to continue on the current dose of Plaquenil as she had no synovitis.  She complains of ongoing arthralgias which most likely is coming from underlying osteoarthritis.  High risk medication use - Plaquenil 200 mg 1 tablet by mouth QD. PLQ eye exam normal 04/08/2022 -September 2024 CBC and CMP were normal.  Will check labs today.  Plan: CBC with Differential/Platelet, COMPLETE METABOLIC PANEL WITH GFR  Primary osteoarthritis of both hands-she has rheumatoid arthritis and osteoarthritis overlap.  She had bilateral PIP DIP and CMC thickening with ongoing discomfort.  Joint protection muscle strengthening was  discussed.  A handout on exercise was given.  Primary osteoarthritis of left hip-she had good range of motion without much discomfort.  Chondrocalcinosis - XR 03/05/22: Mild chondromalacia patella and chondrocalcinosis.  Chondromalacia of both patellae-no warmth or swelling was noted.  Primary osteoarthritis of both feet-she complains of intermittent discomfort in her feet.  No synovitis was noted.  Positive ANA (antinuclear antibody) - 03/05/22: ANA 1:40 cytoplasmic, 1:320Nuclear, nucleolar.  Double-stranded ENA negative, complements within normal limits, Smith antibody negative.  She has no clinical features of lupus.  Fibromyalgia -she continues to have generalized pain and discomfort from fibromyalgia.  She is under the care of Dr. Vear Clock. She remains on Cymbalta , Lyrica 300 mg twice daily, and tizanidine 4 mg 3 times daily.  Bilateral carpal tunnel syndrome - Patient underwent right carpal tunnel release surgery on 08/05/22.  Chronic pain syndrome - She remains on Lyrica, Cymbalta, and tizanidine as prescribed.  She also takes morphine 30 mg every 12 hours for pain relief.  History of osteoporosis - Fosamax 70 mg 1 tablet by mouth once weekly.  History of hyperlipidemia - LDL 118 09/2022  Vitamin D deficiency-  History of hypothyroidism  Essential hypertension-blood pressure was normal today.  Prediabetes  History of COPD  Orders: Orders Placed This Encounter  Procedures   CBC with Differential/Platelet   COMPLETE METABOLIC PANEL WITH GFR   Meds ordered this encounter  Medications   hydroxychloroquine (PLAQUENIL) 200 MG tablet    Sig: Take 1 tablet (200 mg total) by mouth daily.    Dispense:  90 tablet    Refill:  0     Follow-Up Instructions: Return in about 5 months (around 10/06/2023) for Rheumatoid arthritis, Osteoarthritis.   Pollyann Savoy, MD  Note - This record has been created using Animal nutritionist.  Chart creation errors have been sought, but may  not always  have been located. Such creation errors do not reflect on  the standard of medical care.

## 2023-05-06 ENCOUNTER — Encounter: Payer: Self-pay | Admitting: Rheumatology

## 2023-05-06 ENCOUNTER — Ambulatory Visit: Attending: Rheumatology | Admitting: Rheumatology

## 2023-05-06 VITALS — BP 118/74 | HR 78 | Resp 15 | Ht 61.0 in | Wt 164.4 lb

## 2023-05-06 DIAGNOSIS — M19042 Primary osteoarthritis, left hand: Secondary | ICD-10-CM

## 2023-05-06 DIAGNOSIS — M19072 Primary osteoarthritis, left ankle and foot: Secondary | ICD-10-CM

## 2023-05-06 DIAGNOSIS — G5603 Carpal tunnel syndrome, bilateral upper limbs: Secondary | ICD-10-CM | POA: Diagnosis not present

## 2023-05-06 DIAGNOSIS — G894 Chronic pain syndrome: Secondary | ICD-10-CM | POA: Diagnosis not present

## 2023-05-06 DIAGNOSIS — M4722 Other spondylosis with radiculopathy, cervical region: Secondary | ICD-10-CM | POA: Diagnosis not present

## 2023-05-06 DIAGNOSIS — M19041 Primary osteoarthritis, right hand: Secondary | ICD-10-CM

## 2023-05-06 DIAGNOSIS — M797 Fibromyalgia: Secondary | ICD-10-CM

## 2023-05-06 DIAGNOSIS — Z8709 Personal history of other diseases of the respiratory system: Secondary | ICD-10-CM

## 2023-05-06 DIAGNOSIS — M1120 Other chondrocalcinosis, unspecified site: Secondary | ICD-10-CM | POA: Diagnosis not present

## 2023-05-06 DIAGNOSIS — R768 Other specified abnormal immunological findings in serum: Secondary | ICD-10-CM | POA: Diagnosis not present

## 2023-05-06 DIAGNOSIS — R7303 Prediabetes: Secondary | ICD-10-CM

## 2023-05-06 DIAGNOSIS — M19071 Primary osteoarthritis, right ankle and foot: Secondary | ICD-10-CM

## 2023-05-06 DIAGNOSIS — M2241 Chondromalacia patellae, right knee: Secondary | ICD-10-CM | POA: Insufficient documentation

## 2023-05-06 DIAGNOSIS — M2242 Chondromalacia patellae, left knee: Secondary | ICD-10-CM

## 2023-05-06 DIAGNOSIS — M1612 Unilateral primary osteoarthritis, left hip: Secondary | ICD-10-CM | POA: Insufficient documentation

## 2023-05-06 DIAGNOSIS — Z8739 Personal history of other diseases of the musculoskeletal system and connective tissue: Secondary | ICD-10-CM

## 2023-05-06 DIAGNOSIS — M4726 Other spondylosis with radiculopathy, lumbar region: Secondary | ICD-10-CM | POA: Diagnosis not present

## 2023-05-06 DIAGNOSIS — M0579 Rheumatoid arthritis with rheumatoid factor of multiple sites without organ or systems involvement: Secondary | ICD-10-CM

## 2023-05-06 DIAGNOSIS — Z79899 Other long term (current) drug therapy: Secondary | ICD-10-CM | POA: Diagnosis not present

## 2023-05-06 DIAGNOSIS — I10 Essential (primary) hypertension: Secondary | ICD-10-CM

## 2023-05-06 DIAGNOSIS — Z8639 Personal history of other endocrine, nutritional and metabolic disease: Secondary | ICD-10-CM

## 2023-05-06 DIAGNOSIS — E559 Vitamin D deficiency, unspecified: Secondary | ICD-10-CM

## 2023-05-06 MED ORDER — HYDROXYCHLOROQUINE SULFATE 200 MG PO TABS: 200.0000 mg | ORAL_TABLET | Freq: Every day | ORAL | 0 refills | Status: DC

## 2023-05-06 NOTE — Patient Instructions (Addendum)
 Vaccines You are taking a medication(s) that can suppress your immune system.  The following immunizations are recommended: Flu annually Covid-19  RSV Td/Tdap (tetanus, diphtheria, pertussis) every 10 years Pneumonia (Prevnar 15 then Pneumovax 23 at least 1 year apart.  Alternatively, can take Prevnar 20 without needing additional dose) Shingrix: 2 doses from 4 weeks to 6 months apart  Please check with your PCP to make sure you are up to date.   Hand Exercises Hand exercises can be helpful for almost anyone. They can strengthen your hands and improve flexibility and movement. The exercises can also increase blood flow to the hands. These results can make your work and daily tasks easier for you. Hand exercises can be especially helpful for people who have joint pain from arthritis or nerve damage from using their hands over and over. These exercises can also help people who injure a hand. Exercises Most of these hand exercises are gentle stretching and motion exercises. It is usually safe to do them often throughout the day. Warming up your hands before exercise may help reduce stiffness. You can do this with gentle massage or by placing your hands in warm water for 10-15 minutes. It is normal to feel some stretching, pulling, tightness, or mild discomfort when you begin new exercises. In time, this will improve. Remember to always be careful and stop right away if you feel sudden, very bad pain or your pain gets worse. You want to get better and be safe. Ask your health care provider which exercises are safe for you. Do exercises exactly as told by your provider and adjust them as told. Do not begin these exercises until told by your provider. Knuckle bend or "claw" fist  Stand or sit with your arm, hand, and all five fingers pointed straight up. Make sure to keep your wrist straight. Gently bend your fingers down toward your palm until the tips of your fingers are touching your palm. Keep  your big knuckle straight and only bend the small knuckles in your fingers. Hold this position for 10 seconds. Straighten your fingers back to your starting position. Repeat this exercise 5-10 times with each hand. Full finger fist  Stand or sit with your arm, hand, and all five fingers pointed straight up. Make sure to keep your wrist straight. Gently bend your fingers into your palm until the tips of your fingers are touching the middle of your palm. Hold this position for 10 seconds. Extend your fingers back to your starting position, stretching every joint fully. Repeat this exercise 5-10 times with each hand. Straight fist  Stand or sit with your arm, hand, and all five fingers pointed straight up. Make sure to keep your wrist straight. Gently bend your fingers at the big knuckle, where your fingers meet your hand, and at the middle knuckle. Keep the knuckle at the tips of your fingers straight and try to touch the bottom of your palm. Hold this position for 10 seconds. Extend your fingers back to your starting position, stretching every joint fully. Repeat this exercise 5-10 times with each hand. Tabletop  Stand or sit with your arm, hand, and all five fingers pointed straight up. Make sure to keep your wrist straight. Gently bend your fingers at the big knuckle, where your fingers meet your hand, as far down as you can. Keep the small knuckles in your fingers straight. Think of forming a tabletop with your fingers. Hold this position for 10 seconds. Extend your fingers back to  your starting position, stretching every joint fully. Repeat this exercise 5-10 times with each hand. Finger spread  Place your hand flat on a table with your palm facing down. Make sure your wrist stays straight. Spread your fingers and thumb apart from each other as far as you can until you feel a gentle stretch. Hold this position for 10 seconds. Bring your fingers and thumb tight together again. Hold  this position for 10 seconds. Repeat this exercise 5-10 times with each hand. Making circles  Stand or sit with your arm, hand, and all five fingers pointed straight up. Make sure to keep your wrist straight. Make a circle by touching the tip of your thumb to the tip of your index finger. Hold for 10 seconds. Then open your hand wide. Repeat this motion with your thumb and each of your fingers. Repeat this exercise 5-10 times with each hand. Thumb motion  Sit with your forearm resting on a table and your wrist straight. Your thumb should be facing up toward the ceiling. Keep your fingers relaxed as you move your thumb. Lift your thumb up as high as you can toward the ceiling. Hold for 10 seconds. Bend your thumb across your palm as far as you can, reaching the tip of your thumb for the small finger (pinkie) side of your palm. Hold for 10 seconds. Repeat this exercise 5-10 times with each hand. Grip strengthening  Hold a stress ball or other soft ball in the middle of your hand. Slowly increase the pressure, squeezing the ball as much as you can without causing pain. Think of bringing the tips of your fingers into the middle of your palm. All of your finger joints should bend when doing this exercise. Hold your squeeze for 10 seconds, then relax. Repeat this exercise 5-10 times with each hand. Contact a health care provider if: Your hand pain or discomfort gets much worse when you do an exercise. Your hand pain or discomfort does not improve within 2 hours after you exercise. If you have either of these problems, stop doing these exercises right away. Do not do them again unless your provider says that you can. Get help right away if: You develop sudden, severe hand pain or swelling. If this happens, stop doing these exercises right away. Do not do them again unless your provider says that you can. This information is not intended to replace advice given to you by your health care provider.  Make sure you discuss any questions you have with your health care provider. Document Revised: 02/25/2022 Document Reviewed: 02/25/2022 Elsevier Patient Education  2024 [employer]  Low Back Sprain or Strain Rehab Ask your health care provider which exercises are safe for you. Do exercises exactly as told by your health care provider and adjust them as directed. It is normal to feel mild stretching, pulling, tightness, or discomfort as you do these exercises. Stop right away if you feel sudden pain or your pain gets worse. Do not begin these exercises until told by your health care provider. Stretching and range-of-motion exercises These exercises warm up your muscles and joints and improve the movement and flexibility of your back. These exercises also help to relieve pain, numbness, and tingling. Lumbar rotation  Lie on your back on a firm bed or the floor with your knees bent. Straighten your arms out to your sides so each arm forms a 90-degree angle (right angle) with a side of your body. Slowly move (rotate) both of your  knees to one side of your body until you feel a stretch in your lower back (lumbar). Try not to let your shoulders lift off the floor. Hold this position for __________ seconds. Tense your abdominal muscles and slowly move your knees back to the starting position. Repeat this exercise on the other side of your body. Repeat __________ times. Complete this exercise __________ times a day. Single knee to chest  Lie on your back on a firm bed or the floor with both legs straight. Bend one of your knees. Use your hands to move your knee up toward your chest until you feel a gentle stretch in your lower back and buttock. Hold your leg in this position by holding on to the front of your knee. Keep your other leg as straight as possible. Hold this position for __________ seconds. Slowly return to the starting position. Repeat with your other leg. Repeat __________ times.  Complete this exercise __________ times a day. Prone extension on elbows  Lie on your abdomen on a firm bed or the floor (prone position). Prop yourself up on your elbows. Use your arms to help lift your chest up until you feel a gentle stretch in your abdomen and your lower back. This will place some of your body weight on your elbows. If this is uncomfortable, try stacking pillows under your chest. Your hips should stay down, against the surface that you are lying on. Keep your hip and back muscles relaxed. Hold this position for __________ seconds. Slowly relax your upper body and return to the starting position. Repeat __________ times. Complete this exercise __________ times a day. Strengthening exercises These exercises build strength and endurance in your back. Endurance is the ability to use your muscles for a long time, even after they get tired. Pelvic tilt This exercise strengthens the muscles that lie deep in the abdomen. Lie on your back on a firm bed or the floor with your legs extended. Bend your knees so they are pointing toward the ceiling and your feet are flat on the floor. Tighten your lower abdominal muscles to press your lower back against the floor. This motion will tilt your pelvis so your tailbone points up toward the ceiling instead of pointing to your feet or the floor. To help with this exercise, you may place a small towel under your lower back and try to push your back into the towel. Hold this position for __________ seconds. Let your muscles relax completely before you repeat this exercise. Repeat __________ times. Complete this exercise __________ times a day. Alternating arm and leg raises  Get on your hands and knees on a firm surface. If you are on a hard floor, you may want to use padding, such as an exercise mat, to cushion your knees. Line up your arms and legs. Your hands should be directly below your shoulders, and your knees should be directly below  your hips. Lift your left leg behind you. At the same time, raise your right arm and straighten it in front of you. Do not lift your leg higher than your hip. Do not lift your arm higher than your shoulder. Keep your abdominal and back muscles tight. Keep your hips facing the ground. Do not arch your back. Keep your balance carefully, and do not hold your breath. Hold this position for __________ seconds. Slowly return to the starting position. Repeat with your right leg and your left arm. Repeat __________ times. Complete this exercise __________ times a day.  Abdominal set with straight leg raise  Lie on your back on a firm bed or the floor. Bend one of your knees and keep your other leg straight. Tense your abdominal muscles and lift your straight leg up, 4-6 inches (10-15 cm) off the ground. Keep your abdominal muscles tight and hold this position for __________ seconds. Do not hold your breath. Do not arch your back. Keep it flat against the ground. Keep your abdominal muscles tense as you slowly lower your leg back to the starting position. Repeat with your other leg. Repeat __________ times. Complete this exercise __________ times a day. Single leg lower with bent knees Lie on your back on a firm bed or the floor. Tense your abdominal muscles and lift your feet off the floor, one foot at a time, so your knees and hips are bent in 90-degree angles (right angles). Your knees should be over your hips and your lower legs should be parallel to the floor. Keeping your abdominal muscles tense and your knee bent, slowly lower one of your legs so your toe touches the ground. Lift your leg back up to return to the starting position. Do not hold your breath. Do not let your back arch. Keep your back flat against the ground. Repeat with your other leg. Repeat __________ times. Complete this exercise __________ times a day. Posture and body mechanics Good posture and healthy body mechanics  can help to relieve stress in your body's tissues and joints. Body mechanics refers to the movements and positions of your body while you do your daily activities. Posture is part of body mechanics. Good posture means: Your spine is in its natural S-curve position (neutral). Your shoulders are pulled back slightly. Your head is not tipped forward (neutral). Follow these guidelines to improve your posture and body mechanics in your everyday activities. Standing  When standing, keep your spine neutral and your feet about hip-width apart. Keep a slight bend in your knees. Your ears, shoulders, and hips should line up. When you do a task in which you stand in one place for a long time, place one foot up on a stable object that is 2-4 inches (5-10 cm) high, such as a footstool. This helps keep your spine neutral. Sitting  When sitting, keep your spine neutral and keep your feet flat on the floor. Use a footrest, if necessary, and keep your thighs parallel to the floor. Avoid rounding your shoulders, and avoid tilting your head forward. When working at a desk or a computer, keep your desk at a height where your hands are slightly lower than your elbows. Slide your chair under your desk so you are close enough to maintain good posture. When working at a computer, place your monitor at a height where you are looking straight ahead and you do not have to tilt your head forward or downward to look at the screen. Resting When lying down and resting, avoid positions that are most painful for you. If you have pain with activities such as sitting, bending, stooping, or squatting, lie in a position in which your body does not bend very much. For example, avoid curling up on your side with your arms and knees near your chest (fetal position). If you have pain with activities such as standing for a long time or reaching with your arms, lie with your spine in a neutral position and bend your knees slightly. Try the  following positions: Lying on your side with a pillow between your  knees. Lying on your back with a pillow under your knees. Lifting  When lifting objects, keep your feet at least shoulder-width apart and tighten your abdominal muscles. Bend your knees and hips and keep your spine neutral. It is important to lift using the strength of your legs, not your back. Do not lock your knees straight out. Always ask for help to lift heavy or awkward objects. This information is not intended to replace advice given to you by your health care provider. Make sure you discuss any questions you have with your health care provider. Document Revised: 06/16/2022 Document Reviewed: 04/30/2020 Elsevier Patient Education  2024 [employer].

## 2023-05-07 LAB — COMPLETE METABOLIC PANEL WITH GFR
AG Ratio: 1.8 (calc) (ref 1.0–2.5)
ALT: 17 U/L (ref 6–29)
AST: 21 U/L (ref 10–35)
Albumin: 3.9 g/dL (ref 3.6–5.1)
Alkaline phosphatase (APISO): 64 U/L (ref 37–153)
BUN: 15 mg/dL (ref 7–25)
CO2: 29 mmol/L (ref 20–32)
Calcium: 8.6 mg/dL (ref 8.6–10.4)
Chloride: 104 mmol/L (ref 98–110)
Creat: 0.77 mg/dL (ref 0.50–1.05)
Globulin: 2.2 g/dL (ref 1.9–3.7)
Glucose, Bld: 170 mg/dL — ABNORMAL HIGH (ref 65–99)
Potassium: 4.1 mmol/L (ref 3.5–5.3)
Sodium: 141 mmol/L (ref 135–146)
Total Bilirubin: 0.3 mg/dL (ref 0.2–1.2)
Total Protein: 6.1 g/dL (ref 6.1–8.1)
eGFR: 83 mL/min/{1.73_m2} (ref >=60)

## 2023-05-07 LAB — CBC WITH DIFFERENTIAL/PLATELET
Absolute Lymphocytes: 1398 {cells}/uL (ref 850–3900)
Absolute Monocytes: 332 {cells}/uL (ref 200–950)
Basophils Absolute: 33 {cells}/uL (ref 0–200)
Basophils Relative: 0.5 %
Eosinophils Absolute: 202 {cells}/uL (ref 15–500)
Eosinophils Relative: 3.1 %
HCT: 39.1 % (ref 35.0–45.0)
Hemoglobin: 11.7 g/dL (ref 11.7–15.5)
MCH: 23.4 pg — ABNORMAL LOW (ref 27.0–33.0)
MCHC: 29.9 g/dL — ABNORMAL LOW (ref 32.0–36.0)
MCV: 78 fL — ABNORMAL LOW (ref 80.0–100.0)
MPV: 11.9 fL (ref 7.5–12.5)
Monocytes Relative: 5.1 %
Neutro Abs: 4537 {cells}/uL (ref 1500–7800)
Neutrophils Relative %: 69.8 %
Platelets: 186 10*3/uL (ref 140–400)
RBC: 5.01 10*6/uL (ref 3.80–5.10)
RDW: 15.5 % — ABNORMAL HIGH (ref 11.0–15.0)
Total Lymphocyte: 21.5 %
WBC: 6.5 10*3/uL (ref 3.8–10.8)

## 2023-05-07 NOTE — Progress Notes (Signed)
 Glucose elevated at 170.  Please notify patient and forward results to PCP.  CBC is stable.

## 2023-06-15 ENCOUNTER — Encounter (HOSPITAL_COMMUNITY): Payer: Self-pay | Admitting: *Deleted

## 2023-06-15 ENCOUNTER — Inpatient Hospital Stay (HOSPITAL_COMMUNITY)
Admission: EM | Admit: 2023-06-15 | Discharge: 2023-06-17 | DRG: 193 | Disposition: A | Discharge status: Home / Self Care | Attending: Internal Medicine | Admitting: Internal Medicine

## 2023-06-15 ENCOUNTER — Emergency Department (HOSPITAL_COMMUNITY)

## 2023-06-15 ENCOUNTER — Other Ambulatory Visit: Payer: Self-pay

## 2023-06-15 ENCOUNTER — Ambulatory Visit
Admission: EM | Admit: 2023-06-15 | Discharge: 2023-06-15 | Disposition: A | Discharge status: Home / Self Care | Attending: Nurse Practitioner | Admitting: Nurse Practitioner

## 2023-06-15 DIAGNOSIS — E872 Acidosis, unspecified: Secondary | ICD-10-CM | POA: Diagnosis not present

## 2023-06-15 DIAGNOSIS — I6782 Cerebral ischemia: Secondary | ICD-10-CM | POA: Diagnosis not present

## 2023-06-15 DIAGNOSIS — I10 Essential (primary) hypertension: Secondary | ICD-10-CM | POA: Diagnosis not present

## 2023-06-15 DIAGNOSIS — R0602 Shortness of breath: Secondary | ICD-10-CM

## 2023-06-15 DIAGNOSIS — E66811 Obesity, class 1: Secondary | ICD-10-CM | POA: Diagnosis present

## 2023-06-15 DIAGNOSIS — R0989 Other specified symptoms and signs involving the circulatory and respiratory systems: Secondary | ICD-10-CM | POA: Diagnosis not present

## 2023-06-15 DIAGNOSIS — G4730 Sleep apnea, unspecified: Secondary | ICD-10-CM | POA: Diagnosis not present

## 2023-06-15 DIAGNOSIS — R531 Weakness: Secondary | ICD-10-CM

## 2023-06-15 DIAGNOSIS — R918 Other nonspecific abnormal finding of lung field: Secondary | ICD-10-CM | POA: Insufficient documentation

## 2023-06-15 DIAGNOSIS — Z79890 Hormone replacement therapy (postmenopausal): Secondary | ICD-10-CM

## 2023-06-15 DIAGNOSIS — F411 Generalized anxiety disorder: Secondary | ICD-10-CM | POA: Diagnosis present

## 2023-06-15 DIAGNOSIS — Z880 Allergy status to penicillin: Secondary | ICD-10-CM | POA: Diagnosis not present

## 2023-06-15 DIAGNOSIS — Z90710 Acquired absence of both cervix and uterus: Secondary | ICD-10-CM | POA: Diagnosis not present

## 2023-06-15 DIAGNOSIS — Z79899 Other long term (current) drug therapy: Secondary | ICD-10-CM

## 2023-06-15 DIAGNOSIS — M797 Fibromyalgia: Secondary | ICD-10-CM | POA: Diagnosis not present

## 2023-06-15 DIAGNOSIS — Z8249 Family history of ischemic heart disease and other diseases of the circulatory system: Secondary | ICD-10-CM

## 2023-06-15 DIAGNOSIS — E039 Hypothyroidism, unspecified: Secondary | ICD-10-CM | POA: Diagnosis not present

## 2023-06-15 DIAGNOSIS — J440 Chronic obstructive pulmonary disease with acute lower respiratory infection: Secondary | ICD-10-CM | POA: Diagnosis present

## 2023-06-15 DIAGNOSIS — Z9049 Acquired absence of other specified parts of digestive tract: Secondary | ICD-10-CM

## 2023-06-15 DIAGNOSIS — J449 Chronic obstructive pulmonary disease, unspecified: Secondary | ICD-10-CM | POA: Diagnosis not present

## 2023-06-15 DIAGNOSIS — R911 Solitary pulmonary nodule: Secondary | ICD-10-CM | POA: Diagnosis not present

## 2023-06-15 DIAGNOSIS — Z1152 Encounter for screening for COVID-19: Secondary | ICD-10-CM

## 2023-06-15 DIAGNOSIS — Z6830 Body mass index (BMI) 30.0-30.9, adult: Secondary | ICD-10-CM | POA: Diagnosis not present

## 2023-06-15 DIAGNOSIS — D509 Iron deficiency anemia, unspecified: Secondary | ICD-10-CM | POA: Insufficient documentation

## 2023-06-15 DIAGNOSIS — K219 Gastro-esophageal reflux disease without esophagitis: Secondary | ICD-10-CM | POA: Diagnosis not present

## 2023-06-15 DIAGNOSIS — Z79891 Long term (current) use of opiate analgesic: Secondary | ICD-10-CM

## 2023-06-15 DIAGNOSIS — G9389 Other specified disorders of brain: Secondary | ICD-10-CM | POA: Diagnosis not present

## 2023-06-15 DIAGNOSIS — Z87891 Personal history of nicotine dependence: Secondary | ICD-10-CM

## 2023-06-15 DIAGNOSIS — J189 Pneumonia, unspecified organism: Principal | ICD-10-CM | POA: Diagnosis present

## 2023-06-15 DIAGNOSIS — J9601 Acute respiratory failure with hypoxia: Secondary | ICD-10-CM | POA: Insufficient documentation

## 2023-06-15 DIAGNOSIS — I6523 Occlusion and stenosis of bilateral carotid arteries: Secondary | ICD-10-CM | POA: Diagnosis not present

## 2023-06-15 LAB — BASIC METABOLIC PANEL WITH GFR
Anion gap: 11 (ref 5–15)
BUN: 17 mg/dL (ref 8–23)
CO2: 24 mmol/L (ref 22–32)
Calcium: 8.3 mg/dL — ABNORMAL LOW (ref 8.9–10.3)
Chloride: 101 mmol/L (ref 98–111)
Creatinine, Ser: 0.79 mg/dL (ref 0.44–1.00)
GFR, Estimated: >60 mL/min (ref >60)
Glucose, Bld: 147 mg/dL — ABNORMAL HIGH (ref 70–99)
Potassium: 3.6 mmol/L (ref 3.5–5.1)
Sodium: 136 mmol/L (ref 135–145)

## 2023-06-15 LAB — RESP PANEL BY RT-PCR (RSV, FLU A&B, COVID)  RVPGX2
Influenza A by PCR: NEGATIVE
Influenza B by PCR: NEGATIVE
Resp Syncytial Virus by PCR: NEGATIVE
SARS Coronavirus 2 by RT PCR: NEGATIVE

## 2023-06-15 LAB — CBC
HCT: 35.6 % — ABNORMAL LOW (ref 36.0–46.0)
Hemoglobin: 10.6 g/dL — ABNORMAL LOW (ref 12.0–15.0)
MCH: 23.7 pg — ABNORMAL LOW (ref 26.0–34.0)
MCHC: 29.8 g/dL — ABNORMAL LOW (ref 30.0–36.0)
MCV: 79.5 fL — ABNORMAL LOW (ref 80.0–100.0)
Platelets: 159 10*3/uL (ref 150–400)
RBC: 4.48 MIL/uL (ref 3.87–5.11)
RDW: 16.2 % — ABNORMAL HIGH (ref 11.5–15.5)
WBC: 7.8 10*3/uL (ref 4.0–10.5)
nRBC: 0 % (ref 0.0–0.2)

## 2023-06-15 LAB — TROPONIN I (HIGH SENSITIVITY)
Troponin I (High Sensitivity): 3 ng/L (ref <18)
Troponin I (High Sensitivity): 3 ng/L (ref <18)

## 2023-06-15 LAB — LACTIC ACID, PLASMA
Lactic Acid, Venous: 1.7 mmol/L (ref 0.5–1.9)
Lactic Acid, Venous: 2.3 mmol/L (ref 0.5–1.9)

## 2023-06-15 LAB — POC SARS CORONAVIRUS 2 AG -  ED: SARS Coronavirus 2 Ag: NEGATIVE

## 2023-06-15 LAB — POCT FASTING CBG KUC MANUAL ENTRY: POCT Glucose (KUC): 123 mg/dL — AB (ref 70–99)

## 2023-06-15 MED ORDER — IPRATROPIUM-ALBUTEROL 0.5-2.5 (3) MG/3ML IN SOLN
3.0000 mL | Freq: Once | RESPIRATORY_TRACT | Status: AC
  Administered 2023-06-15: 3 mL via RESPIRATORY_TRACT
  Filled 2023-06-15: qty 3

## 2023-06-15 MED ORDER — METHYLPREDNISOLONE SODIUM SUCC 125 MG IJ SOLR
125.0000 mg | Freq: Once | INTRAMUSCULAR | Status: AC
  Administered 2023-06-15: 125 mg via INTRAVENOUS
  Filled 2023-06-15: qty 2

## 2023-06-15 MED ORDER — SODIUM CHLORIDE 0.9 % IV SOLN
500.0000 mg | Freq: Once | INTRAVENOUS | Status: AC
  Administered 2023-06-15: 500 mg via INTRAVENOUS
  Filled 2023-06-15: qty 5

## 2023-06-15 MED ORDER — IOHEXOL 350 MG/ML SOLN
75.0000 mL | Freq: Once | INTRAVENOUS | Status: AC | PRN
  Administered 2023-06-15: 75 mL via INTRAVENOUS

## 2023-06-15 MED ORDER — SODIUM CHLORIDE 0.9 % IV SOLN
2.0000 g | Freq: Once | INTRAVENOUS | Status: AC
  Administered 2023-06-15: 2 g via INTRAVENOUS
  Filled 2023-06-15: qty 20

## 2023-06-15 NOTE — ED Triage Notes (Signed)
 Headache, weakness, cough, not able to eat or drink much x 2 days.   Pt states she fell and hit the back of her head Saturday.

## 2023-06-15 NOTE — ED Provider Notes (Signed)
 [facility]    CSN: [id number] Arrival date & time: 06/15/23  1353      History   Chief Complaint Chief Complaint  Patient presents with   Weakness   Fatigue   Cough    HPI Anessia Oakland is a 68 y.o. female.   The history is provided by the patient.   Patient presents for complaints of headache, weakness, cough, shortness of breath, dizziness, and decreased appetite.  Patient states symptoms started approximately 2 days ago.  Patient states that she did fall and hit the back of her head prior to her symptoms starting.  She states that she did not lose consciousness.  Patient denies fever, chills, difficulty breathing, abdominal pain, nausea, vomiting, or diarrhea.  Patient reports prior history of COPD.  She also has underlying history of hypertension, anxiety, GERD, and OSA.  Patient states that she has not taking any medication for her symptoms.  Baseline o2 sats during triage - 85%.  Past Medical History:  Diagnosis Date   COPD (chronic obstructive pulmonary disease) (HCC)    Fibromyalgia    GAD (generalized anxiety disorder)    GERD (gastroesophageal reflux disease)    Hypertension    Hypothyroidism    Other intervertebral disc degeneration, lumbar region    Sleep apnea     Patient Active Problem List   Diagnosis Date Noted   Rheumatoid arthritis with rheumatoid factor of multiple sites without organ or systems involvement (HCC) 05/06/2023   Primary osteoarthritis of both hands 05/06/2023   Primary osteoarthritis of left hip 05/06/2023   Chondrocalcinosis 05/06/2023   Chondromalacia of both patellae 05/06/2023   Primary osteoarthritis of both feet 05/06/2023   Fibromyalgia 05/06/2023   Chronic pain syndrome 05/06/2023   Vulvar ulceration 06/18/2020   Itching in the vaginal area 06/18/2020    Past Surgical History:  Procedure Laterality Date   CARPAL TUNNEL RELEASE Right 08/05/2022   Procedure: RIGHT CARPAL TUNNEL RELEASE;  Surgeon: Brunilda Capra,  MD;  Location: [facility];  Service: Orthopedics;  Laterality: Right;   CHOLECYSTECTOMY     DILATION AND CURETTAGE OF UTERUS     LAPAROSCOPIC ASSISTED VAGINAL HYSTERECTOMY     SPINAL CORD STIMULATOR INSERTION     stomach stapling  1980    OB History     Gravida  6   Para  4   Term  4   Preterm      AB  2   Living  4      SAB  1   IAB  1   Ectopic      Multiple      Live Births               Home Medications    Prior to Admission medications   Medication Sig Start Date End Date Taking? Authorizing Provider  alendronate (FOSAMAX) 70 MG tablet Take 70 mg by mouth once a week. 12/04/21  Yes [provider]  DULoxetine  (CYMBALTA ) 60 MG capsule Take 60 mg by mouth 2 (two) times daily.   Yes [provider]  fluticasone-salmeterol (ADVAIR) 250-50 MCG/ACT AEPB 1 puff 2 (two) times daily as needed.   Yes [provider]  hydroxychloroquine  (PLAQUENIL ) 200 MG tablet Take 1 tablet (200 mg total) by mouth daily. 05/06/23  Yes Deveshwar, Clydie Darter, MD  levothyroxine  (SYNTHROID ) 100 MCG tablet Take 100 mcg by mouth daily. 04/16/23  Yes [provider]  morphine  (MS CONTIN ) 30 MG 12 hr tablet Take 30  mg by mouth every 12 (twelve) hours.   Yes [provider]  Multiple Vitamin (MULTIVITAMIN PO) Take by mouth.   Yes [provider]  pantoprazole  (PROTONIX ) 40 MG tablet Take 40 mg by mouth daily.   Yes [provider]  pregabalin  (LYRICA ) 300 MG capsule Take 300 mg by mouth 2 (two) times daily.   Yes [provider]  Vitamin D, Ergocalciferol, 50000 units CAPS Take 50,000 Units by mouth once a week. 12/06/21  Yes [provider]  albuterol  (VENTOLIN  HFA) 108 (90 Base) MCG/ACT inhaler Inhale into the lungs every 6 (six) hours as needed for wheezing or shortness of breath. Patient not taking: Reported on 05/06/2023    [provider]  tiZANidine  (ZANAFLEX ) 4 MG capsule Take 4 mg by  mouth 3 (three) times daily.    [provider]    Family History Family History  Problem Relation Age of Onset   Diabetes Mother    Heart disease Mother    Diabetes Father    Breast cancer Paternal Grandmother    Asthma Daughter    Fibroids Daughter    Rheum arthritis Daughter    Cancer Daughter    Asthma Son    Psoriasis Son     Social History Social History   Tobacco Use   Smoking status: Former    Current packs/day: 0.00    Average packs/day: 1 pack/day for 52.0 years (52.0 ttl pk-yrs)    Types: Cigarettes    Start date: 20    Quit date: 2021    Years since quitting: 4.3    Passive exposure: Never   Smokeless tobacco: Never  Vaping Use   Vaping status: Every Day  Substance Use Topics   Alcohol use: Never   Drug use: Never     Allergies   Penicillin g   Review of Systems Review of Systems Per HPI  Physical Exam Triage Vital Signs ED Triage Vitals  Encounter Vitals Group     BP 06/15/23 1427 111/68     Systolic BP Percentile --      Diastolic BP Percentile --      Pulse Rate 06/15/23 1427 76     Resp 06/15/23 1427 (!) 22     Temp 06/15/23 1427 98.3 F (36.8 C)     Temp Source 06/15/23 1427 Oral     SpO2 06/15/23 1427 (!) 86 %     Weight --      Height --      Head Circumference --      Peak Flow --      Pain Score 06/15/23 1428 7     Pain Loc --      Pain Education --      Exclude from Growth Chart --    Orthostatic VS for the past 24 hrs:  BP- Lying Pulse- Lying BP- Sitting Pulse- Sitting BP- Standing at 0 minutes Pulse- Standing at 0 minutes  06/15/23 1454 110/68 70 117/71 77 106/66 79    Updated Vital Signs BP 111/68 (BP Location: Right Arm)   Pulse 76   Temp 98.3 F (36.8 C) (Oral)   Resp (!) 22   SpO2 92%   Visual Acuity Right Eye Distance:   Left Eye Distance:   Bilateral Distance:    Right Eye Near:   Left Eye Near:    Bilateral Near:     Physical Exam Vitals and nursing note reviewed.  Constitutional:       General: She  is not in acute distress.    Appearance: Normal appearance. She is ill-appearing.  HENT:     Head: Normocephalic.     Right Ear: Tympanic membrane, ear canal and external ear normal.     Left Ear: Tympanic membrane, ear canal and external ear normal.     Nose: Nose normal.     Right Turbinates: Enlarged and swollen.     Left Turbinates: Enlarged and swollen.     Right Sinus: No maxillary sinus tenderness or frontal sinus tenderness.     Left Sinus: No maxillary sinus tenderness or frontal sinus tenderness.     Mouth/Throat:     Lips: Pink.     Mouth: Mucous membranes are moist.     Pharynx: Oropharynx is clear. Uvula midline. No posterior oropharyngeal erythema.  Eyes:     Extraocular Movements: Extraocular movements intact.     Conjunctiva/sclera: Conjunctivae normal.     Pupils: Pupils are equal, round, and reactive to light.  Cardiovascular:     Rate and Rhythm: Regular rhythm.     Pulses: Normal pulses.     Heart sounds: Normal heart sounds.  Pulmonary:     Effort: Pulmonary effort is normal. No respiratory distress.     Breath sounds: Normal breath sounds. No stridor. No wheezing, rhonchi or rales.  Abdominal:     General: Bowel sounds are normal.     Palpations: Abdomen is soft.     Tenderness: There is no abdominal tenderness.  Musculoskeletal:     Cervical back: Normal range of motion.  Lymphadenopathy:     Cervical: No cervical adenopathy.  Skin:    General: Skin is warm and dry.     Coloration: Skin is pale.  Neurological:     General: No focal deficit present.     Mental Status: She is alert and oriented to person, place, and time.  Psychiatric:        Mood and Affect: Mood normal.        Behavior: Behavior normal.      UC Treatments / Results  Labs (all labs ordered are listed, but only abnormal results are displayed) Labs Reviewed  POCT FASTING CBG [facility] MANUAL ENTRY - Abnormal; Notable for the following components:      Result Value    POCT Glucose ([facility]) 123 (*)    All other components within normal limits  POC SARS CORONAVIRUS 2 AG -  ED - Normal    EKG: NSR, no ectopy, no STEMI.  No other comparisons available.   Radiology No results found.  Procedures Procedures (including critical care time)  Medications Ordered in UC Medications - No data to display  Initial Impression / Assessment and Plan / UC Course  I have reviewed the triage vital signs and the nursing notes.  Pertinent labs & imaging results that were available during my care of the patient were reviewed by me and considered in my medical decision making (see chart for details).  COVID test was negative, fingerstick blood glucose was 123, and orthostatic vital signs were also negative.  Patient continues to have an ill appearance, she is speaking complete sentences, and is in no acute distress..  Trial ambulation was attempted, O2 sats dropped down to 82% on room air.  EKG shows normal sinus rhythm.  Imaging is not available in this clinic today.  Difficult to ascertain the cause of the patient's symptoms.  Given her low O2 sats with generalized weakness, will have patient go to the emergency  department for further evaluation.  Recommended transport to the emergency department given the patient's home O2 sats.  Patient declined transfer via EMS discussing both risk and benefits, patient continued to decline. Patient's son will transport her to the emergency department.   Final Clinical Impressions(s) / UC Diagnoses   Final diagnoses:  None   Discharge Instructions   None    ED Prescriptions   None    PDMP not reviewed this encounter.   Hardy Lia, NP 06/15/23 1935

## 2023-06-15 NOTE — ED Provider Notes (Signed)
 [place] EMERGENCY DEPARTMENT AT [facility] Provider Note   CSN: [id number] Arrival date & time: 06/15/23  1537     History  Chief Complaint  Patient presents with   Shortness of Breath    Dawn Hughes is a 68 y.o. female.  68 year old female with past medical history of COPD and hypertension who is on Plaquenil  as well presenting to the emergency department today with fever and cough.  The patient states she has had cough as well as nasal congestion and fever now since Saturday.  She states that she has had a cough that has been minimally productive.  She states that she did have a temperature of up to 101 at home.  She came to the ER after going to urgent care and being sent in for some hypoxia.  The patient does not wear oxygen at baseline.  She denies any associated chest pain.  Denies any hemoptysis.  Denies any leg swelling currently.   Shortness of Breath Associated symptoms: cough and fever        Home Medications Prior to Admission medications   Medication Sig Start Date End Date Taking? Authorizing Provider  albuterol  (VENTOLIN  HFA) 108 (90 Base) MCG/ACT inhaler Inhale into the lungs every 6 (six) hours as needed for wheezing or shortness of breath. Patient not taking: Reported on 05/06/2023    [provider]  alendronate (FOSAMAX) 70 MG tablet Take 70 mg by mouth once a week. 12/04/21   [provider]  DULoxetine  (CYMBALTA ) 60 MG capsule Take 60 mg by mouth 2 (two) times daily.    [provider]  fluticasone-salmeterol (ADVAIR) 250-50 MCG/ACT AEPB 1 puff 2 (two) times daily as needed.    [provider]  hydroxychloroquine  (PLAQUENIL ) 200 MG tablet Take 1 tablet (200 mg total) by mouth daily. 05/06/23   Nicholas Bari, MD  levothyroxine  (SYNTHROID ) 100 MCG tablet Take 100 mcg by mouth daily. 04/16/23   [provider]  morphine  (MS CONTIN ) 30 MG 12 hr tablet Take 30 mg by mouth every 12 (twelve) hours.     [provider]  Multiple Vitamin (MULTIVITAMIN PO) Take by mouth.    [provider]  pantoprazole  (PROTONIX ) 40 MG tablet Take 40 mg by mouth daily.    [provider]  pregabalin  (LYRICA ) 300 MG capsule Take 300 mg by mouth 2 (two) times daily.    [provider]  tiZANidine  (ZANAFLEX ) 4 MG capsule Take 4 mg by mouth 3 (three) times daily.    [provider]  Vitamin D, Ergocalciferol, 50000 units CAPS Take 50,000 Units by mouth once a week. 12/06/21   [provider]      Allergies    Penicillin g    Review of Systems   Review of Systems  Constitutional:  Positive for fever.  Respiratory:  Positive for cough and shortness of breath.   All other systems reviewed and are negative.   Physical Exam Updated Vital Signs BP 122/64   Pulse 70   Temp 98.2 F (36.8 C) (Oral)   Resp 17   Ht 5\' 1"  (1.549 m)   Wt 72.6 kg   SpO2 92%   BMI 30.23 kg/m  Physical Exam Vitals and nursing note reviewed.   Gen: Mild conversational dyspnea noted Eyes: PERRL, EOMI HEENT: no oropharyngeal swelling Neck: trachea midline, no meningismus Resp: Diminished with wheezes throughout lower lung fields Card: RRR, no murmurs, rubs, or gallops Abd: nontender, nondistended Extremities: no calf tenderness, no  edema Vascular: 2+ radial pulses bilaterally, 2+ DP pulses bilaterally Skin: no rashes Psyc: acting appropriately   ED Results / Procedures / Treatments   Labs (all labs ordered are listed, but only abnormal results are displayed) Labs Reviewed  BASIC METABOLIC PANEL WITH GFR - Abnormal; Notable for the following components:      Result Value   Glucose, Bld 147 (*)    Calcium 8.3 (*)    All other components within normal limits  CBC - Abnormal; Notable for the following components:   Hemoglobin 10.6 (*)    HCT 35.6 (*)    MCV 79.5 (*)    MCH 23.7 (*)    MCHC 29.8 (*)    RDW 16.2 (*)    All other components within normal limits   LACTIC ACID, PLASMA - Abnormal; Notable for the following components:   Lactic Acid, Venous 2.3 (*)    All other components within normal limits  RESP PANEL BY RT-PCR (RSV, FLU A&B, COVID)  [id number]  CULTURE, BLOOD (ROUTINE X 2)  CULTURE, BLOOD (ROUTINE X 2)  LACTIC ACID, PLASMA  TROPONIN I (HIGH SENSITIVITY)  TROPONIN I (HIGH SENSITIVITY)    EKG EKG Interpretation Date/Time:  Monday June 15 2023 16:07:14 EDT Ventricular Rate:  79 PR Interval:  153 QRS Duration:  98 QT Interval:  382 QTC Calculation: 438 R Axis:   63  Text Interpretation: Sinus rhythm Confirmed by Abner Hoffman [id number]) on 06/15/2023 4:15:12 PM  Radiology CT Head Wo Contrast Result Date: 06/15/2023 CLINICAL DATA:  Weakness. EXAM: CT HEAD WITHOUT CONTRAST TECHNIQUE: Contiguous axial images were obtained from the base of the skull through the vertex without intravenous contrast. RADIATION DOSE REDUCTION: This exam was performed according to the departmental dose-optimization program which includes automated exposure control, adjustment of the mA and/or kV according to patient size and/or use of iterative reconstruction technique. COMPARISON:  None Available. FINDINGS: Brain: There is mild cerebral atrophy with widening of the extra-axial spaces and ventricular dilatation. There are areas of decreased attenuation within the white matter tracts of the supratentorial brain, consistent with microvascular disease changes. Chronic bilateral basal ganglia lacunar infarcts are noted. Vascular: Marked severity bilateral cavernous carotid artery calcification is seen. Skull: Normal. Negative for fracture or focal lesion. Sinuses/Orbits: No acute finding. Other: None. IMPRESSION: 1. No acute intracranial abnormality. 2. Generalized cerebral atrophy with chronic bilateral basal ganglia lacunar infarcts and chronic white matter small vessel ischemic changes. Electronically Signed   By: Virgle Grime M.D.   On: 06/15/2023 20:03   DG  Chest Port 1 View Result Date: 06/15/2023 CLINICAL DATA:  Shortness of breath and weakness. EXAM: PORTABLE CHEST 1 VIEW COMPARISON:  None Available. FINDINGS: The heart size and mediastinal contours are within normal limits. Low lung volumes are noted. Left-sided volume loss is also seen. There is to moderate severity left basilar atelectasis and/or infiltrate. Small, ill-defined focal areas of atelectasis and/or infiltrate are also seen along the periphery of the right lung. No pleural effusion or pneumothorax is identified. A spinal stimulator wire is seen with multilevel degenerative changes noted throughout the thoracic spine. IMPRESSION: 1. Findings concerning for the presence of multifocal atelectasis and/or infiltrate, most prominent within the left lung base. Correlation with chest CT is recommended. Electronically Signed   By: Virgle Grime M.D.   On: 06/15/2023 19:59    Procedures Procedures    Medications Ordered in ED Medications  methylPREDNISolone  sodium succinate (SOLU-MEDROL ) 125 mg/2 mL injection 125 mg (125 mg Intravenous Given 06/15/23  1716)  ipratropium-albuterol  (DUONEB) 0.5-2.5 (3) MG/3ML nebulizer solution 3 mL (3 mLs Nebulization Given 06/15/23 1628)  ipratropium-albuterol  (DUONEB) 0.5-2.5 (3) MG/3ML nebulizer solution 3 mL (3 mLs Nebulization Given 06/15/23 1628)  ipratropium-albuterol  (DUONEB) 0.5-2.5 (3) MG/3ML nebulizer solution 3 mL (3 mLs Nebulization Given 06/15/23 1628)  cefTRIAXone  (ROCEPHIN ) 2 g in sodium chloride  0.9 % 100 mL IVPB (0 g Intravenous Stopped 06/15/23 2107)  azithromycin  (ZITHROMAX ) 500 mg in sodium chloride  0.9 % 250 mL IVPB (0 mg Intravenous Stopped 06/15/23 2136)  iohexol  (OMNIPAQUE ) 350 MG/ML injection 75 mL (75 mLs Intravenous Contrast Given 06/15/23 2019)    ED Course/ Medical Decision Making/ A&P                                 Medical Decision Making 67 year old female with past medical history of COPD and hypertension presenting to the  emergency department today with cough and shortness of breath.  I will further evaluate the patient here with basic labs as well as an EKG, chest x-ray, and troponin to evaluate for pulmonary edema, pulmonary infiltrates, pneumothorax.  With her with her being on Plaquenil  we will also obtain a lactic acid and blood cultures.  I will give the patient Solu-Medrol  as well as DuoNebs.  Will obtain a COVID and flu swab on the patient here.  She did have COVID testing along at urgent care which was negative but certainly could be secondary to the flu or RSV.  This may be due to bronchitis/COPD exacerbation.  Based on description of her symptoms suspicion for pulmonary embolism is low at this time.  I will reevaluate for ultimate disposition.  X-ray concerning for possible left lower lobe pneumonia and possible pleural effusion.  CT is recommended.  This is ordered.  The patient is covered with Rocephin  and azithromycin .  The patient's CT does not show a large PE on my read.  There does appear to be an infiltrate.  A call is placed to the hospitalist service for admission.   Amount and/or Complexity of Data Reviewed Labs: ordered. Radiology: ordered.  Risk Prescription drug management. Decision regarding hospitalization.           Final Clinical Impression(s) / ED Diagnoses Final diagnoses:  Pneumonia due to infectious organism, unspecified laterality, unspecified part of lung    Rx / DC Orders ED Discharge Orders     None         Carin Charleston, MD 06/15/23 2329

## 2023-06-15 NOTE — ED Notes (Signed)
 Per providers instruction pt was taken off 2 L of oxygen to ambulate through the clinic. Upon removal of the nasal canula pt's O2 was 94%, after 3 laps pt's O2 dropped significantly registering at 82%. At the time of ambulation pt denied any chest pain or difficulty breathing.

## 2023-06-15 NOTE — ED Notes (Signed)
 Patient is being discharged from the Urgent Care and sent to the Emergency Department via PV, son came to take her to ER . Per provider Gerlene Koh., patient is in need of higher level of care due to fatigue, cough, low oxygen level, needing further testing. Patient is aware and verbalizes understanding of plan of care.  Vitals:   06/15/23 1427 06/15/23 1428  BP: 111/68   Pulse: 76   Resp: (!) 22   Temp: 98.3 F (36.8 C)   SpO2: (!) 86% 92%

## 2023-06-15 NOTE — Discharge Instructions (Signed)
 Go to the emergency department for further evaluation.

## 2023-06-15 NOTE — H&P (Signed)
 History and Physical    Patient: Dawn Hughes XWR:[id number] DOB: [birth year] DOA: 06/15/2023 DOS: the patient was seen and examined on 06/16/2023 PCP: Omie Bickers, MD  Patient coming from: Home  Chief Complaint:  Chief Complaint  Patient presents with   Shortness of Breath   HPI: Dawn Hughes is a 68 y.o. female with medical history significant of hypertension, hypothyroidism, GERD, COPD who presents to the emergency department due to 3-day onset of minimally productive cough, increased shortness of breath, nasal congestion and fever of up to 101F at home.  She went to an urgent care where she was noted to be hypoxic and she was asked to go to the ER for further evaluation and management.  She denies chest pain, headache, nausea, vomiting or leg swelling.  ED Course:  In the emergency department, respiratory rate was 22/min, O2 sat was 92% on supplemental oxygen at 2 LPM.  Other vital signs were within normal range.  Workup in the ED showed microcytic anemia.  BMP was normal except for blood glucose of 147.  Troponin x 2 was flat at 3.  Lactic acid 1.7 > 2.3.  Influenza A, B, SARS coronavirus 2, RSV was negative.  Blood culture pending. CT head without contrast showed no acute intracranial normality CT angiography chest with contrast showed no evidence of acute pulmonary embolism. Marked severity left upper lobe and left lower lobe infiltrates with underlying nodular and mass-like areas concerning for the presence of an underlying neoplastic process.  Patient was treated with IV ceftriaxone  and azithromycin .  Breathing treatment was provided, IV Solu-Medrol  125 mg x 1 was given.  Hospitalist was asked to admit patient for further evaluation and management.  Review of Systems: Review of systems as noted in the HPI. All other systems reviewed and are negative.   Past Medical History:  Diagnosis Date   COPD (chronic obstructive pulmonary disease) (HCC)    Fibromyalgia    GAD  (generalized anxiety disorder)    GERD (gastroesophageal reflux disease)    Hypertension    Hypothyroidism    Other intervertebral disc degeneration, lumbar region    Sleep apnea    Past Surgical History:  Procedure Laterality Date   CARPAL TUNNEL RELEASE Right 08/05/2022   Procedure: RIGHT CARPAL TUNNEL RELEASE;  Surgeon: Brunilda Capra, MD;  Location: [facility];  Service: Orthopedics;  Laterality: Right;   CHOLECYSTECTOMY     DILATION AND CURETTAGE OF UTERUS     LAPAROSCOPIC ASSISTED VAGINAL HYSTERECTOMY     SPINAL CORD STIMULATOR INSERTION     stomach stapling  1980    Social History:  reports that she quit smoking about 4 years ago. Her smoking use included cigarettes. She started smoking about 56 years ago. She has a 52 pack-year smoking history. She has never been exposed to tobacco smoke. She has never used smokeless tobacco. She reports that she does not drink alcohol and does not use drugs.   Allergies  Allergen Reactions   Penicillin G Hives    Family History  Problem Relation Age of Onset   Diabetes Mother    Heart disease Mother    Diabetes Father    Breast cancer Paternal Grandmother    Asthma Daughter    Fibroids Daughter    Rheum arthritis Daughter    Cancer Daughter    Asthma Son    Psoriasis Son      Prior to Admission medications   Medication Sig Start Date End Date Taking? Authorizing Provider  albuterol  (VENTOLIN  HFA) 108 (90 Base) MCG/ACT inhaler Inhale into the lungs every 6 (six) hours as needed for wheezing or shortness of breath. Patient not taking: Reported on 05/06/2023    [provider]  alendronate (FOSAMAX) 70 MG tablet Take 70 mg by mouth once a week. 12/04/21   [provider]  DULoxetine  (CYMBALTA ) 60 MG capsule Take 60 mg by mouth 2 (two) times daily.    [provider]  fluticasone-salmeterol (ADVAIR) 250-50 MCG/ACT AEPB 1 puff 2 (two) times daily as needed.    [provider]   hydroxychloroquine  (PLAQUENIL ) 200 MG tablet Take 1 tablet (200 mg total) by mouth daily. 05/06/23   Nicholas Bari, MD  levothyroxine  (SYNTHROID ) 100 MCG tablet Take 100 mcg by mouth daily. 04/16/23   [provider]  morphine  (MS CONTIN ) 30 MG 12 hr tablet Take 30 mg by mouth every 12 (twelve) hours.    [provider]  Multiple Vitamin (MULTIVITAMIN PO) Take by mouth.    [provider]  pantoprazole  (PROTONIX ) 40 MG tablet Take 40 mg by mouth daily.    [provider]  pregabalin  (LYRICA ) 300 MG capsule Take 300 mg by mouth 2 (two) times daily.    [provider]  tiZANidine  (ZANAFLEX ) 4 MG capsule Take 4 mg by mouth 3 (three) times daily.    [provider]  Vitamin D, Ergocalciferol, 50000 units CAPS Take 50,000 Units by mouth once a week. 12/06/21   [provider]    Physical Exam: BP (!) 142/62   Pulse 64   Temp 98.2 F (36.8 C) (Oral)   Resp 12   Ht 5\' 1"  (1.549 m)   Wt 72.6 kg   SpO2 93%   BMI 30.23 kg/m   General: 68 y.o. year-old female well developed well nourished in no acute distress.  Alert and oriented x3. HEENT: NCAT, EOMI Neck: Supple, trachea medial Cardiovascular: Regular rate and rhythm with no rubs or gallops.  No thyromegaly or JVD noted.  No lower extremity edema. 2/4 pulses in all 4 extremities. Respiratory: Decreased breath sounds with rhonchi  (L > R ) on auscultation.   Abdomen: Soft, nontender nondistended with normal bowel sounds x4 quadrants. Muskuloskeletal: No cyanosis, clubbing or edema noted bilaterally Neuro: CN II-XII intact, strength 5/5 x 4, sensation, reflexes intact Skin: No ulcerative lesions noted or rashes Psychiatry: Mood is appropriate for condition and setting          Labs on Admission:  Basic Metabolic Panel: Recent Labs  Lab 06/15/23 1623  NA 136  K 3.6  CL 101  CO2 24  GLUCOSE 147*  BUN 17  CREATININE 0.79  CALCIUM 8.3*   Liver Function Tests: No  results for input(s): "AST", "ALT", "ALKPHOS", "BILITOT", "PROT", "ALBUMIN" in the last 168 hours. No results for input(s): "LIPASE", "AMYLASE" in the last 168 hours. No results for input(s): "AMMONIA" in the last 168 hours. CBC: Recent Labs  Lab 06/15/23 1623  WBC 7.8  HGB 10.6*  HCT 35.6*  MCV 79.5*  PLT 159   Cardiac Enzymes: No results for input(s): "CKTOTAL", "CKMB", "CKMBINDEX", "TROPONINI" in the last 168 hours.  BNP (last 3 results) No results for input(s): "BNP" in the last 8760 hours.  ProBNP (last 3 results) No results for input(s): "PROBNP" in the last 8760 hours.  CBG: No results for input(s): "GLUCAP" in the last 168 hours.  Radiological Exams on Admission: CT Angio Chest PE W and/or Wo Contrast Result Date: 06/15/2023 CLINICAL  DATA:  Shortness of breath and weakness. EXAM: CT ANGIOGRAPHY CHEST WITH CONTRAST TECHNIQUE: Multidetector CT imaging of the chest was performed using the standard protocol during bolus administration of intravenous contrast. Multiplanar CT image reconstructions and MIPs were obtained to evaluate the vascular anatomy. RADIATION DOSE REDUCTION: This exam was performed according to the departmental dose-optimization program which includes automated exposure control, adjustment of the mA and/or kV according to patient size and/or use of iterative reconstruction technique. CONTRAST:  75mL OMNIPAQUE  IOHEXOL  350 MG/ML SOLN COMPARISON:  None Available. FINDINGS: Cardiovascular: There is mild calcification of the aortic arch, without evidence of aortic aneurysm. Satisfactory opacification of the pulmonary arteries to the segmental level. No evidence of pulmonary embolism. Normal heart size with marked severity coronary artery calcification. No pericardial effusion. Mediastinum/Nodes: No enlarged mediastinal, hilar, or axillary lymph nodes. Thyroid  gland, trachea, and esophagus demonstrate no significant findings. Lungs/Pleura: Marked severity posterior left  upper lobe and left lower lobe infiltrates are seen. 11 mm and 14 mm underlying pulmonary nodules are seen within the posterior left upper lobe (axial CT images 48 through 57, CT series 6). A 3.0 cm x 2.0 cm ill-defined mass-like area is seen within the left lower lobe. There is no evidence of a pleural effusion or pneumothorax. Upper Abdomen: Surgical staples are seen within the gastric region. Musculoskeletal: A spinal stimulator wire is seen with its distal tip at the approximate level of T7. Review of the MIP images confirms the above findings. IMPRESSION: 1. Marked severity left upper lobe and left lower lobe infiltrates with underlying nodular and mass-like areas concerning for the presence of an underlying neoplastic process. Pulmonology and oncology consultation with follow-up nuclear medicine PET scan and/or tissue sampling is recommended. This recommendation follows the consensus statement: Guidelines for Management of Incidental Pulmonary Nodules Detected on CT Images: From the Fleischner Society 2017; Radiology 2017; 284:228-243. 2. No evidence of acute pulmonary embolism. 3. Postoperative changes consistent with prior gastric stapling. Electronically Signed   By: Virgle Grime M.D.   On: 06/15/2023 23:49   CT Head Wo Contrast Result Date: 06/15/2023 CLINICAL DATA:  Weakness. EXAM: CT HEAD WITHOUT CONTRAST TECHNIQUE: Contiguous axial images were obtained from the base of the skull through the vertex without intravenous contrast. RADIATION DOSE REDUCTION: This exam was performed according to the departmental dose-optimization program which includes automated exposure control, adjustment of the mA and/or kV according to patient size and/or use of iterative reconstruction technique. COMPARISON:  None Available. FINDINGS: Brain: There is mild cerebral atrophy with widening of the extra-axial spaces and ventricular dilatation. There are areas of decreased attenuation within the white matter tracts of  the supratentorial brain, consistent with microvascular disease changes. Chronic bilateral basal ganglia lacunar infarcts are noted. Vascular: Marked severity bilateral cavernous carotid artery calcification is seen. Skull: Normal. Negative for fracture or focal lesion. Sinuses/Orbits: No acute finding. Other: None. IMPRESSION: 1. No acute intracranial abnormality. 2. Generalized cerebral atrophy with chronic bilateral basal ganglia lacunar infarcts and chronic white matter small vessel ischemic changes. Electronically Signed   By: Virgle Grime M.D.   On: 06/15/2023 20:03   DG Chest Port 1 View Result Date: 06/15/2023 CLINICAL DATA:  Shortness of breath and weakness. EXAM: PORTABLE CHEST 1 VIEW COMPARISON:  None Available. FINDINGS: The heart size and mediastinal contours are within normal limits. Low lung volumes are noted. Left-sided volume loss is also seen. There is to moderate severity left basilar atelectasis and/or infiltrate. Small, ill-defined focal areas of atelectasis and/or infiltrate are also  seen along the periphery of the right lung. No pleural effusion or pneumothorax is identified. A spinal stimulator wire is seen with multilevel degenerative changes noted throughout the thoracic spine. IMPRESSION: 1. Findings concerning for the presence of multifocal atelectasis and/or infiltrate, most prominent within the left lung base. Correlation with chest CT is recommended. Electronically Signed   By: Virgle Grime M.D.   On: 06/15/2023 19:59    EKG: I independently viewed the EKG done and my findings are as followed:, Normal sinus rhythm at a rate of 75 bpm  Assessment/Plan Present on Admission:  CAP (community acquired pneumonia)  Principal Problem:   CAP (community acquired pneumonia) Active Problems:   Acute respiratory failure with hypoxia (HCC)   Lactic acidosis   Microcytic anemia   Pulmonary nodule   Pulmonary mass   Acquired hypothyroidism   GERD (gastroesophageal reflux  disease)   COPD (chronic obstructive pulmonary disease) (HCC)   CAP POA Patient was started on ceftriaxone  and azithromycin , we shall continue same at this time with plan to de-escalate/discontinue based on blood culture, sputum culture, urine Legionella and strep pneumo  Continue Tylenol  as needed Continue Mucinex , incentive spirometry, flutter valve   Acute respiratory failure with hypoxia Continue supplemental oxygen to maintain O2 sat > 94%, with plan to wean patient off of these as tolerated.  Patient does not use supplemental oxygen at baseline  Lactic acidosis Lactic acid 1.7 > 2.3 Continue IV hydration, continue to trend lactic acid  Microcytic anemia Iron studies will be done  Pulmonary nodule and pulmonary mass-like CT angiography of chest with contrast showed  nodular and mass-like areas concerning for the presence of an underlying neoplastic process. Pulmonology and oncology consultation with follow-up nuclear medicine PET scan and/or tissue sampling is recommended by radiologist  Acquired hypothyroidism Continue Synthroid   GERD Continue Protonix   COPD Continue albuterol  as needed   DVT prophylaxis: Lovenox   Code Status: Full code  Family Communication: None at bedside  Consults: None  Severity of Illness: The appropriate patient status for this patient is INPATIENT. Inpatient status is judged to be reasonable and necessary in order to provide the required intensity of service to ensure the patient's safety. The patient's presenting symptoms, physical exam findings, and initial radiographic and laboratory data in the context of their chronic comorbidities is felt to place them at high risk for further clinical deterioration. Furthermore, it is not anticipated that the patient will be medically stable for discharge from the hospital within 2 midnights of admission.   * I certify that at the point of admission it is my clinical judgment that the patient will  require inpatient hospital care spanning beyond 2 midnights from the point of admission due to high intensity of service, high risk for further deterioration and high frequency of surveillance required.*  Author: Rolf Fells, DO 06/16/2023 5:12 AM  For on call review [email or web address].

## 2023-06-15 NOTE — ED Notes (Signed)
 Patient transported to CT

## 2023-06-15 NOTE — ED Triage Notes (Signed)
 Pt was at Urgent Care today with c/o SOB and weakness. Pt was told her oxygen level was low and they sent her to the ED for further evaluation. Pt reports she fell flat on her back the other day but that it was after her SOB started.

## 2023-06-16 DIAGNOSIS — J189 Pneumonia, unspecified organism: Secondary | ICD-10-CM | POA: Diagnosis not present

## 2023-06-16 DIAGNOSIS — E872 Acidosis, unspecified: Secondary | ICD-10-CM | POA: Insufficient documentation

## 2023-06-16 DIAGNOSIS — K219 Gastro-esophageal reflux disease without esophagitis: Secondary | ICD-10-CM | POA: Insufficient documentation

## 2023-06-16 DIAGNOSIS — R918 Other nonspecific abnormal finding of lung field: Secondary | ICD-10-CM | POA: Insufficient documentation

## 2023-06-16 DIAGNOSIS — R911 Solitary pulmonary nodule: Secondary | ICD-10-CM | POA: Insufficient documentation

## 2023-06-16 DIAGNOSIS — E039 Hypothyroidism, unspecified: Secondary | ICD-10-CM | POA: Insufficient documentation

## 2023-06-16 DIAGNOSIS — D509 Iron deficiency anemia, unspecified: Secondary | ICD-10-CM | POA: Insufficient documentation

## 2023-06-16 DIAGNOSIS — J9601 Acute respiratory failure with hypoxia: Secondary | ICD-10-CM | POA: Insufficient documentation

## 2023-06-16 DIAGNOSIS — J449 Chronic obstructive pulmonary disease, unspecified: Secondary | ICD-10-CM | POA: Insufficient documentation

## 2023-06-16 LAB — COMPREHENSIVE METABOLIC PANEL WITH GFR
ALT: 23 U/L (ref 0–44)
AST: 20 U/L (ref 15–41)
Albumin: 3.2 g/dL — ABNORMAL LOW (ref 3.5–5.0)
Alkaline Phosphatase: 65 U/L (ref 38–126)
Anion gap: 10 (ref 5–15)
BUN: 16 mg/dL (ref 8–23)
CO2: 26 mmol/L (ref 22–32)
Calcium: 9.1 mg/dL (ref 8.9–10.3)
Chloride: 106 mmol/L (ref 98–111)
Creatinine, Ser: 0.58 mg/dL (ref 0.44–1.00)
GFR, Estimated: >60 mL/min (ref >60)
Glucose, Bld: 163 mg/dL — ABNORMAL HIGH (ref 70–99)
Potassium: 4.9 mmol/L (ref 3.5–5.1)
Sodium: 142 mmol/L (ref 135–145)
Total Bilirubin: 0.6 mg/dL (ref 0.0–1.2)
Total Protein: 7 g/dL (ref 6.5–8.1)

## 2023-06-16 LAB — CBC
HCT: 35.8 % — ABNORMAL LOW (ref 36.0–46.0)
Hemoglobin: 10.7 g/dL — ABNORMAL LOW (ref 12.0–15.0)
MCH: 23.5 pg — ABNORMAL LOW (ref 26.0–34.0)
MCHC: 29.9 g/dL — ABNORMAL LOW (ref 30.0–36.0)
MCV: 78.5 fL — ABNORMAL LOW (ref 80.0–100.0)
Platelets: 177 10*3/uL (ref 150–400)
RBC: 4.56 MIL/uL (ref 3.87–5.11)
RDW: 16.3 % — ABNORMAL HIGH (ref 11.5–15.5)
WBC: 5.2 10*3/uL (ref 4.0–10.5)
nRBC: 0 % (ref 0.0–0.2)

## 2023-06-16 LAB — FERRITIN: Ferritin: 86 ng/mL (ref 11–307)

## 2023-06-16 LAB — IRON AND TIBC
Iron: 15 ug/dL — ABNORMAL LOW (ref 28–170)
Saturation Ratios: 3 % — ABNORMAL LOW (ref 10.4–31.8)
TIBC: 436 ug/dL (ref 250–450)
UIBC: 421 ug/dL

## 2023-06-16 LAB — HIV ANTIBODY (ROUTINE TESTING W REFLEX): HIV Screen 4th Generation wRfx: NONREACTIVE

## 2023-06-16 LAB — PHOSPHORUS: Phosphorus: 3 mg/dL (ref 2.5–4.6)

## 2023-06-16 LAB — MAGNESIUM: Magnesium: 2 mg/dL (ref 1.7–2.4)

## 2023-06-16 MED ORDER — MORPHINE SULFATE ER 30 MG PO TBCR
30.0000 mg | EXTENDED_RELEASE_TABLET | Freq: Two times a day (BID) | ORAL | Status: DC
  Administered 2023-06-16 – 2023-06-17 (×3): 30 mg via ORAL
  Filled 2023-06-16 (×3): qty 1

## 2023-06-16 MED ORDER — ENOXAPARIN SODIUM 40 MG/0.4ML IJ SOSY
40.0000 mg | PREFILLED_SYRINGE | INTRAMUSCULAR | Status: DC
  Administered 2023-06-16 – 2023-06-17 (×2): 40 mg via SUBCUTANEOUS
  Filled 2023-06-16 (×2): qty 0.4

## 2023-06-16 MED ORDER — SODIUM CHLORIDE 0.9 % IV SOLN
1.0000 g | INTRAVENOUS | Status: DC
  Administered 2023-06-16: 1 g via INTRAVENOUS
  Filled 2023-06-16: qty 10

## 2023-06-16 MED ORDER — PREGABALIN 75 MG PO CAPS
300.0000 mg | ORAL_CAPSULE | Freq: Two times a day (BID) | ORAL | Status: DC
  Administered 2023-06-16 – 2023-06-17 (×3): 300 mg via ORAL
  Filled 2023-06-16 (×3): qty 4

## 2023-06-16 MED ORDER — LEVOTHYROXINE SODIUM 100 MCG PO TABS
100.0000 ug | ORAL_TABLET | Freq: Every day | ORAL | Status: DC
  Administered 2023-06-16 – 2023-06-17 (×2): 100 ug via ORAL
  Filled 2023-06-16 (×2): qty 1

## 2023-06-16 MED ORDER — TIZANIDINE HCL 4 MG PO TABS
4.0000 mg | ORAL_TABLET | Freq: Three times a day (TID) | ORAL | Status: DC
  Administered 2023-06-16 – 2023-06-17 (×4): 4 mg via ORAL
  Filled 2023-06-16 (×4): qty 1

## 2023-06-16 MED ORDER — ONDANSETRON HCL 4 MG/2ML IJ SOLN: 4.0000 mg | Freq: Four times a day (QID) | INTRAMUSCULAR | Status: DC | PRN

## 2023-06-16 MED ORDER — SODIUM CHLORIDE 0.9 % IV SOLN
500.0000 mg | INTRAVENOUS | Status: DC
  Administered 2023-06-16: 500 mg via INTRAVENOUS
  Filled 2023-06-16: qty 5

## 2023-06-16 MED ORDER — LACTATED RINGERS IV SOLN: INTRAVENOUS | Status: DC

## 2023-06-16 MED ORDER — PANTOPRAZOLE SODIUM 40 MG PO TBEC
40.0000 mg | DELAYED_RELEASE_TABLET | Freq: Every day | ORAL | Status: DC
  Administered 2023-06-16 – 2023-06-17 (×2): 40 mg via ORAL
  Filled 2023-06-16 (×2): qty 1

## 2023-06-16 MED ORDER — HYDROXYCHLOROQUINE SULFATE 200 MG PO TABS
200.0000 mg | ORAL_TABLET | Freq: Every day | ORAL | Status: DC
  Administered 2023-06-16 – 2023-06-17 (×2): 200 mg via ORAL
  Filled 2023-06-16 (×2): qty 1

## 2023-06-16 MED ORDER — ACETAMINOPHEN 325 MG PO TABS
650.0000 mg | ORAL_TABLET | Freq: Four times a day (QID) | ORAL | Status: DC | PRN
  Administered 2023-06-16: 650 mg via ORAL
  Filled 2023-06-16: qty 2

## 2023-06-16 MED ORDER — DULOXETINE HCL 60 MG PO CPEP
60.0000 mg | ORAL_CAPSULE | Freq: Two times a day (BID) | ORAL | Status: DC
  Administered 2023-06-16 – 2023-06-17 (×3): 60 mg via ORAL
  Filled 2023-06-16 (×3): qty 1

## 2023-06-16 MED ORDER — ONDANSETRON HCL 4 MG PO TABS: 4.0000 mg | ORAL_TABLET | Freq: Four times a day (QID) | ORAL | Status: DC | PRN

## 2023-06-16 MED ORDER — ALBUTEROL SULFATE (2.5 MG/3ML) 0.083% IN NEBU: 2.5000 mg | INHALATION_SOLUTION | Freq: Four times a day (QID) | RESPIRATORY_TRACT | Status: DC | PRN

## 2023-06-16 MED ORDER — ACETAMINOPHEN 650 MG RE SUPP: 650.0000 mg | Freq: Four times a day (QID) | RECTAL | Status: DC | PRN

## 2023-06-16 MED ORDER — DM-GUAIFENESIN ER 30-600 MG PO TB12
1.0000 | ORAL_TABLET | Freq: Two times a day (BID) | ORAL | Status: DC
Start: 2023-06-16 — End: 2023-06-17
  Administered 2023-06-16 – 2023-06-17 (×3): 1 via ORAL
  Filled 2023-06-16 (×3): qty 1

## 2023-06-16 NOTE — Progress Notes (Signed)
 Mobility Specialist Progress Note:    06/16/23 1512  Mobility  Activity Ambulated with assistance to bathroom  Level of Assistance Standby assist, set-up cues, supervision of patient - no hands on  Assistive Device None  Distance Ambulated (ft) 25 ft  Range of Motion/Exercises Active;All extremities  Activity Response Tolerated well  Mobility Referral Yes  Mobility visit 1 Mobility  Mobility Specialist Start Time (ACUTE ONLY) 1440  Mobility Specialist Stop Time (ACUTE ONLY) 1500  Mobility Specialist Time Calculation (min) (ACUTE ONLY) 20 min   Pt received requesting assistance to bathroom required supervision to stand and ambulate with no AD. Tolerated well, asx throughout. Returned pt supine, alarm on. All needs met.   Glinda Lapping Mobility Specialist Please contact via Special educational needs teacher or  Rehab office at [phone]

## 2023-06-16 NOTE — Progress Notes (Signed)
 Nurse call to get report from ED nurse, informed that nurse was with a patient and would call back to give report when available.

## 2023-06-16 NOTE — Progress Notes (Signed)
 Transition of Care Department [facility]) has reviewed patient and no other TOC needs have been identified at this time. We will continue to monitor patient advancement through interdisciplinary progression rounds. If new patient transition needs arise, please place a TOC consult.   06/16/23 0744  TOC Brief Assessment  Insurance and Status Reviewed  Patient has primary care physician Yes  Home environment has been reviewed Lives alone.  Prior level of function: Independent.  Prior/Current Home Services No current home services  Social Drivers of Health Review SDOH reviewed no interventions necessary  Readmission risk has been reviewed Yes  Transition of care needs no transition of care needs at this time

## 2023-06-16 NOTE — Plan of Care (Signed)
   Problem: Education: Goal: Knowledge of General Education information will improve Description Including pain rating scale, medication(s)/side effects and non-pharmacologic comfort measures Outcome: Progressing   Problem: Education: Goal: Knowledge of General Education information will improve Description Including pain rating scale, medication(s)/side effects and non-pharmacologic comfort measures Outcome: Progressing

## 2023-06-16 NOTE — Progress Notes (Signed)
 PROGRESS NOTE    Dawn Hughes  ZOX:[id number] DOB: 1955/12/03 DOA: 06/15/2023 PCP: Omie Bickers, MD   Brief Narrative:    Dawn Hughes is a 68 y.o. female with medical history significant of hypertension, hypothyroidism, GERD, COPD who presents to the emergency department due to 3-day onset of minimally productive cough, increased shortness of breath, nasal congestion and fever of up to 101F at home.  She went to an urgent care where she was noted to be hypoxic and she was asked to go to the ER for further evaluation and management.  Patient was admitted with acute hypoxemic respiratory failure secondary to community-acquired pneumonia and is incidentally noted to have a pulmonary mass.  Assessment & Plan:   Principal Problem:   CAP (community acquired pneumonia) Active Problems:   Acute respiratory failure with hypoxia (HCC)   Lactic acidosis   Microcytic anemia   Pulmonary nodule   Pulmonary mass   Acquired hypothyroidism   GERD (gastroesophageal reflux disease)   COPD (chronic obstructive pulmonary disease) (HCC)  Assessment and Plan:   CAP POA Patient was started on ceftriaxone  and azithromycin , we shall continue same at this time with plan to de-escalate/discontinue based on blood culture, sputum culture, urine Legionella and strep pneumo  Continue Tylenol  as needed Continue Mucinex , incentive spirometry, flutter valve    Acute respiratory failure with hypoxia Continue supplemental oxygen to maintain O2 sat > 94%, with plan to wean patient off of these as tolerated.  Patient does not use supplemental oxygen at baseline - Wean to room air   Lactic acidosis Lactic acid 1.7 > 2.3 Hold further IV hydration and monitor in a.m.   Microcytic anemia Iron studies will be done with noted deficiency   Pulmonary nodule and pulmonary mass-like CT angiography of chest with contrast showed  nodular and mass-like areas concerning for the presence of an underlying neoplastic  process. Pulmonology and oncology consultation with follow-up nuclear medicine PET scan and/or tissue sampling is recommended by radiologist -Discussed with patient and will require outpatient follow-up with pulmonary and oncology   Acquired hypothyroidism Continue Synthroid    GERD Continue Protonix    COPD Continue albuterol  as needed  Obesity, class I BMI 30.23   DVT prophylaxis:Lovenox  Code Status: Full Family Communication: None at bedside Disposition Plan:  Status is: Inpatient Remains inpatient appropriate because: Need for IV medications.   Consultants:  None  Procedures:  None  Antimicrobials:  Anti-infectives (From admission, onward)    Start     Dose/Rate Route Frequency Ordered Stop   06/16/23 2000  cefTRIAXone  (ROCEPHIN ) 1 g in sodium chloride  0.9 % 100 mL IVPB        1 g 200 mL/hr over 30 Minutes Intravenous Every 24 hours 06/16/23 0502 06/20/23 1959   06/16/23 2000  azithromycin  (ZITHROMAX ) 500 mg in sodium chloride  0.9 % 250 mL IVPB        500 mg 250 mL/hr over 60 Minutes Intravenous Every 24 hours 06/16/23 0503 06/20/23 1959   06/15/23 2015  cefTRIAXone  (ROCEPHIN ) 2 g in sodium chloride  0.9 % 100 mL IVPB        2 g 200 mL/hr over 30 Minutes Intravenous  Once 06/15/23 2008 06/15/23 2107   06/15/23 2015  azithromycin  (ZITHROMAX ) 500 mg in sodium chloride  0.9 % 250 mL IVPB        500 mg 250 mL/hr over 60 Minutes Intravenous  Once 06/15/23 2008 06/15/23 2136       Subjective: Patient seen and evaluated today with no  new acute complaints or concerns. No acute concerns or events noted overnight.  Objective: Vitals:   06/16/23 0415 06/16/23 0430 06/16/23 0445 06/16/23 0645  BP: (!) 145/67 (!) 146/63 (!) 142/62 131/67  Pulse: 66 64 64 66  Resp:    15  Temp:      TempSrc:      SpO2: 92% 93% 93% 93%  Weight:      Height:        Intake/Output Summary (Last 24 hours) at 06/16/2023 0715 Last data filed at 06/15/2023 2136 Gross per 24 hour  Intake  338.38 ml  Output --  Net 338.38 ml   Filed Weights   06/15/23 1557  Weight: 72.6 kg    Examination:  General exam: Appears calm and comfortable  Respiratory system: Clear to auscultation. Respiratory effort normal.  2 L nasal cannula Cardiovascular system: S1 & S2 heard, RRR.  Gastrointestinal system: Abdomen is soft Central nervous system: Alert and awake Extremities: No edema Skin: No significant lesions noted Psychiatry: Flat affect.    Data Reviewed: I have personally reviewed following labs and imaging studies  CBC: Recent Labs  Lab 06/15/23 1623 06/16/23 0501  WBC 7.8 5.2  HGB 10.6* 10.7*  HCT 35.6* 35.8*  MCV 79.5* 78.5*  PLT 159 177   Basic Metabolic Panel: Recent Labs  Lab 06/15/23 1623 06/16/23 0501  NA 136 142  K 3.6 4.9  CL 101 106  CO2 24 26  GLUCOSE 147* 163*  BUN 17 16  CREATININE 0.79 0.58  CALCIUM 8.3* 9.1  MG  --  2.0  PHOS  --  3.0   GFR: Estimated Creatinine Clearance: 60.5 mL/min (by C-G formula based on SCr of 0.58 mg/dL). Liver Function Tests: Recent Labs  Lab 06/16/23 0501  AST 20  ALT 23  ALKPHOS 65  BILITOT 0.6  PROT 7.0  ALBUMIN 3.2*   No results for input(s): "LIPASE", "AMYLASE" in the last 168 hours. No results for input(s): "AMMONIA" in the last 168 hours. Coagulation Profile: No results for input(s): "INR", "PROTIME" in the last 168 hours. Cardiac Enzymes: No results for input(s): "CKTOTAL", "CKMB", "CKMBINDEX", "TROPONINI" in the last 168 hours. BNP (last 3 results) No results for input(s): "PROBNP" in the last 8760 hours. HbA1C: No results for input(s): "HGBA1C" in the last 72 hours. CBG: No results for input(s): "GLUCAP" in the last 168 hours. Lipid Profile: No results for input(s): "CHOL", "HDL", "LDLCALC", "TRIG", "CHOLHDL", "LDLDIRECT" in the last 72 hours. Thyroid  Function Tests: No results for input(s): "TSH", "T4TOTAL", "FREET4", "T3FREE", "THYROIDAB" in the last 72 hours. Anemia Panel: Recent  Labs    06/16/23 0508  FERRITIN 86  TIBC 436  IRON 15*   Sepsis Labs: Recent Labs  Lab 06/15/23 1648 06/15/23 1817  LATICACIDVEN 1.7 2.3*    Recent Results (from the past 240 hours)  Resp panel by RT-PCR (RSV, Flu A&B, Covid) Anterior Nasal Swab     Status: None   Collection Time: 06/15/23  4:45 PM   Specimen: Anterior Nasal Swab  Result Value Ref Range Status   SARS Coronavirus 2 by RT PCR NEGATIVE NEGATIVE Final    Comment: (NOTE) SARS-CoV-2 target nucleic acids are NOT DETECTED.  The SARS-CoV-2 RNA is generally detectable in upper respiratory specimens during the acute phase of infection. The lowest concentration of SARS-CoV-2 viral copies this assay can detect is 138 copies/mL. A negative result does not preclude SARS-Cov-2 infection and should not be used as the sole basis for treatment or other  patient management decisions. A negative result may occur with  improper specimen collection/handling, submission of specimen other than nasopharyngeal swab, presence of viral mutation(s) within the areas targeted by this assay, and inadequate number of viral copies(<138 copies/mL). A negative result must be combined with clinical observations, patient history, and epidemiological information. The expected result is Negative.  Fact Sheet for Patients:  BloggerCourse.com  Fact Sheet for Healthcare Providers:  SeriousBroker.it  This test is no t yet approved or cleared by the [place]  FDA and  has been authorized for detection and/or diagnosis of SARS-CoV-2 by FDA under an Emergency Use Authorization (EUA). This EUA will remain  in effect (meaning this test can be used) for the duration of the COVID-19 declaration under Section 564(b)(1) of the Act, 21 U.S.C.section 360bbb-3(b)(1), unless the authorization is terminated  or revoked sooner.       Influenza A by PCR NEGATIVE NEGATIVE Final   Influenza B by PCR  NEGATIVE NEGATIVE Final    Comment: (NOTE) The Xpert Xpress SARS-CoV-2/FLU/RSV plus assay is intended as an aid in the diagnosis of influenza from Nasopharyngeal swab specimens and should not be used as a sole basis for treatment. Nasal washings and aspirates are unacceptable for Xpert Xpress SARS-CoV-2/FLU/RSV testing.  Fact Sheet for Patients: BloggerCourse.com  Fact Sheet for Healthcare Providers: SeriousBroker.it  This test is not yet approved or cleared by the [place]  FDA and has been authorized for detection and/or diagnosis of SARS-CoV-2 by FDA under an Emergency Use Authorization (EUA). This EUA will remain in effect (meaning this test can be used) for the duration of the COVID-19 declaration under Section 564(b)(1) of the Act, 21 U.S.C. section 360bbb-3(b)(1), unless the authorization is terminated or revoked.     Resp Syncytial Virus by PCR NEGATIVE NEGATIVE Final    Comment: (NOTE) Fact Sheet for Patients: BloggerCourse.com  Fact Sheet for Healthcare Providers: SeriousBroker.it  This test is not yet approved or cleared by the [place]  FDA and has been authorized for detection and/or diagnosis of SARS-CoV-2 by FDA under an Emergency Use Authorization (EUA). This EUA will remain in effect (meaning this test can be used) for the duration of the COVID-19 declaration under Section 564(b)(1) of the Act, 21 U.S.C. section 360bbb-3(b)(1), unless the authorization is terminated or revoked.  Performed at [facility], [address], [place], [place] [address]   Blood culture (routine x 2)     Status: None (Preliminary result)   Collection Time: 06/15/23  4:48 PM   Specimen: BLOOD LEFT ARM  Result Value Ref Range Status   Specimen Description BLOOD LEFT ARM  Final   Special Requests   Final    BOTTLES DRAWN AEROBIC AND ANAEROBIC Blood Culture adequate  volume Performed at [facility], [address] [place], [place] [address]    Culture PENDING  Incomplete   Report Status PENDING  Incomplete  Blood culture (routine x 2)     Status: None (Preliminary result)   Collection Time: 06/15/23  5:11 PM   Specimen: BLOOD LEFT HAND  Result Value Ref Range Status   Specimen Description BLOOD LEFT HAND  Final   Special Requests   Final    BOTTLES DRAWN AEROBIC ONLY Blood Culture adequate volume Performed at [facility], [address] [place], [place] [address]    Culture PENDING  Incomplete   Report Status PENDING  Incomplete         Radiology Studies: CT Angio Chest PE W and/or Wo Contrast Result Date: 06/15/2023  CLINICAL DATA:  Shortness of breath and weakness. EXAM: CT ANGIOGRAPHY CHEST WITH CONTRAST TECHNIQUE: Multidetector CT imaging of the chest was performed using the standard protocol during bolus administration of intravenous contrast. Multiplanar CT image reconstructions and MIPs were obtained to evaluate the vascular anatomy. RADIATION DOSE REDUCTION: This exam was performed according to the departmental dose-optimization program which includes automated exposure control, adjustment of the mA and/or kV according to patient size and/or use of iterative reconstruction technique. CONTRAST:  75mL OMNIPAQUE  IOHEXOL  350 MG/ML SOLN COMPARISON:  None Available. FINDINGS: Cardiovascular: There is mild calcification of the aortic arch, without evidence of aortic aneurysm. Satisfactory opacification of the pulmonary arteries to the segmental level. No evidence of pulmonary embolism. Normal heart size with marked severity coronary artery calcification. No pericardial effusion. Mediastinum/Nodes: No enlarged mediastinal, hilar, or axillary lymph nodes. Thyroid  gland, trachea, and esophagus demonstrate no significant findings. Lungs/Pleura: Marked severity posterior left upper lobe and left lower lobe infiltrates are seen. 11 mm and 14 mm underlying  pulmonary nodules are seen within the posterior left upper lobe (axial CT images 48 through 57, CT series 6). A 3.0 cm x 2.0 cm ill-defined mass-like area is seen within the left lower lobe. There is no evidence of a pleural effusion or pneumothorax. Upper Abdomen: Surgical staples are seen within the gastric region. Musculoskeletal: A spinal stimulator wire is seen with its distal tip at the approximate level of T7. Review of the MIP images confirms the above findings. IMPRESSION: 1. Marked severity left upper lobe and left lower lobe infiltrates with underlying nodular and mass-like areas concerning for the presence of an underlying neoplastic process. Pulmonology and oncology consultation with follow-up nuclear medicine PET scan and/or tissue sampling is recommended. This recommendation follows the consensus statement: Guidelines for Management of Incidental Pulmonary Nodules Detected on CT Images: From the Fleischner Society 2017; Radiology 2017; 284:228-243. 2. No evidence of acute pulmonary embolism. 3. Postoperative changes consistent with prior gastric stapling. Electronically Signed   By: Virgle Grime M.D.   On: 06/15/2023 23:49   CT Head Wo Contrast Result Date: 06/15/2023 CLINICAL DATA:  Weakness. EXAM: CT HEAD WITHOUT CONTRAST TECHNIQUE: Contiguous axial images were obtained from the base of the skull through the vertex without intravenous contrast. RADIATION DOSE REDUCTION: This exam was performed according to the departmental dose-optimization program which includes automated exposure control, adjustment of the mA and/or kV according to patient size and/or use of iterative reconstruction technique. COMPARISON:  None Available. FINDINGS: Brain: There is mild cerebral atrophy with widening of the extra-axial spaces and ventricular dilatation. There are areas of decreased attenuation within the white matter tracts of the supratentorial brain, consistent with microvascular disease changes. Chronic  bilateral basal ganglia lacunar infarcts are noted. Vascular: Marked severity bilateral cavernous carotid artery calcification is seen. Skull: Normal. Negative for fracture or focal lesion. Sinuses/Orbits: No acute finding. Other: None. IMPRESSION: 1. No acute intracranial abnormality. 2. Generalized cerebral atrophy with chronic bilateral basal ganglia lacunar infarcts and chronic white matter small vessel ischemic changes. Electronically Signed   By: Virgle Grime M.D.   On: 06/15/2023 20:03   DG Chest Port 1 View Result Date: 06/15/2023 CLINICAL DATA:  Shortness of breath and weakness. EXAM: PORTABLE CHEST 1 VIEW COMPARISON:  None Available. FINDINGS: The heart size and mediastinal contours are within normal limits. Low lung volumes are noted. Left-sided volume loss is also seen. There is to moderate severity left basilar atelectasis and/or infiltrate. Small, ill-defined focal areas of atelectasis and/or infiltrate are  also seen along the periphery of the right lung. No pleural effusion or pneumothorax is identified. A spinal stimulator wire is seen with multilevel degenerative changes noted throughout the thoracic spine. IMPRESSION: 1. Findings concerning for the presence of multifocal atelectasis and/or infiltrate, most prominent within the left lung base. Correlation with chest CT is recommended. Electronically Signed   By: Virgle Grime M.D.   On: 06/15/2023 19:59     Scheduled Meds:  dextromethorphan -guaiFENesin   1 tablet Oral BID   enoxaparin  (LOVENOX ) injection  40 mg Subcutaneous Q24H   Continuous Infusions:  azithromycin      cefTRIAXone  (ROCEPHIN )  IV     lactated ringers  80 mL/hr at 06/16/23 0523     LOS: 1 day    Time spent: 55 minutes    Barbi Kumagai Loran Rock, DO [facility]  If 7PM-7AM, please contact night-coverage [email or web address] 06/16/2023, 7:15 AM

## 2023-06-16 NOTE — Progress Notes (Addendum)
 Mobility Specialist Progress Note:    06/16/23 0950  Mobility  Activity Ambulated with assistance to bathroom;Ambulated with assistance in room;Transferred from bed to chair  Level of Assistance Standby assist, set-up cues, supervision of patient - no hands on  Assistive Device None  Distance Ambulated (ft) 35 ft  Range of Motion/Exercises Active;All extremities  Activity Response Tolerated well  Mobility Referral Yes  Mobility visit 1 Mobility  Mobility Specialist Start Time (ACUTE ONLY) 0935  Mobility Specialist Stop Time (ACUTE ONLY) 0950  Mobility Specialist Time Calculation (min) (ACUTE ONLY) 15 min   Pt received in bed requesting assistance to bathroom. Required supervision to stand and ambulate with no AD. Tolerated well, SpO2 94% on RA. Audible SOB, notified nurse. Left pt in chair, call bell in reach. All needs met.   Glinda Lapping Mobility Specialist Please contact via Special educational needs teacher or  Rehab office at [phone]

## 2023-06-16 NOTE — ED Notes (Signed)
 Report called and accepted by Val on 300. Transport called and accepted transport request.

## 2023-06-16 NOTE — Progress Notes (Signed)
 Mobility Specialist Progress Note:    06/16/23 1055  Therapy Vitals  Pulse Rate 64  Oxygen Therapy  SpO2 95 %  O2 Device Room Air  Mobility  Activity Transferred from chair to bed;Stood at bedside;Dangled on edge of bed  Level of Assistance Standby assist, set-up cues, supervision of patient - no hands on  Assistive Device None  Distance Ambulated (ft) 10 ft  Range of Motion/Exercises Active;All extremities  Activity Response Tolerated well  Mobility Referral Yes  Mobility visit 1 Mobility  Mobility Specialist Start Time (ACUTE ONLY) 1040  Mobility Specialist Stop Time (ACUTE ONLY) 1055  Mobility Specialist Time Calculation (min) (ACUTE ONLY) 15 min   Pt received requesting assistance to bed. Required supervision to stand and ambulate with no AD. Tolerated well, SpO2 93% on RA. Left pt supine, alarm on. All needs met.  Glinda Lapping Mobility Specialist Please contact via Special educational needs teacher or  Rehab office at [phone]

## 2023-06-17 DIAGNOSIS — J189 Pneumonia, unspecified organism: Secondary | ICD-10-CM | POA: Diagnosis not present

## 2023-06-17 LAB — CBC
HCT: 32.4 % — ABNORMAL LOW (ref 36.0–46.0)
Hemoglobin: 9.8 g/dL — ABNORMAL LOW (ref 12.0–15.0)
MCH: 23.4 pg — ABNORMAL LOW (ref 26.0–34.0)
MCHC: 30.2 g/dL (ref 30.0–36.0)
MCV: 77.5 fL — ABNORMAL LOW (ref 80.0–100.0)
Platelets: 180 10*3/uL (ref 150–400)
RBC: 4.18 MIL/uL (ref 3.87–5.11)
RDW: 16.3 % — ABNORMAL HIGH (ref 11.5–15.5)
WBC: 8.6 10*3/uL (ref 4.0–10.5)
nRBC: 0 % (ref 0.0–0.2)

## 2023-06-17 LAB — BASIC METABOLIC PANEL WITH GFR
Anion gap: 9 (ref 5–15)
BUN: 11 mg/dL (ref 8–23)
CO2: 25 mmol/L (ref 22–32)
Calcium: 8.7 mg/dL — ABNORMAL LOW (ref 8.9–10.3)
Chloride: 107 mmol/L (ref 98–111)
Creatinine, Ser: 0.6 mg/dL (ref 0.44–1.00)
GFR, Estimated: >60 mL/min (ref >60)
Glucose, Bld: 99 mg/dL (ref 70–99)
Potassium: 3.8 mmol/L (ref 3.5–5.1)
Sodium: 141 mmol/L (ref 135–145)

## 2023-06-17 LAB — LACTIC ACID, PLASMA: Lactic Acid, Venous: 0.8 mmol/L (ref 0.5–1.9)

## 2023-06-17 LAB — MAGNESIUM: Magnesium: 1.8 mg/dL (ref 1.7–2.4)

## 2023-06-17 MED ORDER — CEFDINIR 300 MG PO CAPS: 300.0000 mg | ORAL_CAPSULE | Freq: Two times a day (BID) | ORAL | 0 refills | Status: AC

## 2023-06-17 MED ORDER — AZITHROMYCIN 500 MG PO TABS: 500.0000 mg | ORAL_TABLET | Freq: Every day | ORAL | 0 refills | Status: AC

## 2023-06-17 NOTE — Progress Notes (Signed)
 Patient discharged home with instructions given on medications and follow up visits,patient verbalized understanding . Prescriptions sent to Pharmacy of choice documented on AVS. IV discontinued,catheter intact. Accompanied by staff to awaiting vehicle.

## 2023-06-17 NOTE — Discharge Summary (Signed)
 Physician Discharge Summary  Dawn Hughes WUJ:[id number] DOB: 05-Oct-1955 DOA: 06/15/2023  PCP: Omie Bickers, MD  Admit date: 06/15/2023  Discharge date: 06/17/2023  Admitted From:Home  Disposition:  Home  Recommendations for Outpatient Follow-up:  Follow up with PCP in 1-2 weeks Follow-up with pulmonology with referral sent regarding lung mass Continue antibiotics with Omnicef  and azithromycin  as prescribed to complete course of treatment for pneumonia Continue other home medications as prior  Home Health: None  Equipment/Devices: None  Discharge Condition:Stable  CODE STATUS: Full  Diet recommendation: Heart Healthy  Brief/Interim Summary: Dawn Hughes is a 68 y.o. female with medical history significant of hypertension, hypothyroidism, GERD, COPD who presents to the emergency department due to 3-day onset of minimally productive cough, increased shortness of breath, nasal congestion and fever of up to 101F at home.  She went to an urgent care where she was noted to be hypoxic and she was asked to go to the ER for further evaluation and management.  Patient was admitted with acute hypoxemic respiratory failure secondary to community-acquired pneumonia and is incidentally noted to have a pulmonary mass.  She has been weaned off of oxygen and is now in stable condition for discharge and will need close follow-up with referral sent to pulmonology.  She will transition to oral antibiotics to finish her course of treatment.  No other acute events or concerns noted.  Discharge Diagnoses:  Principal Problem:   CAP (community acquired pneumonia) Active Problems:   Acute respiratory failure with hypoxia (HCC)   Lactic acidosis   Microcytic anemia   Pulmonary nodule   Pulmonary mass   Acquired hypothyroidism   GERD (gastroesophageal reflux disease)   COPD (chronic obstructive pulmonary disease) (HCC)  Principal discharge diagnosis: Acute hypoxemic respiratory failure secondary to  community-acquired pneumonia.  Incidental finding of pulmonary mass.  Discharge Instructions  Discharge Instructions     Diet - low sodium heart healthy   Complete by: As directed    Increase activity slowly   Complete by: As directed    Pulmonary Visit   Complete by: As directed    Reason for referral: Lung Mass/Lung Nodule   Does the patient's CT scan have any urgent finds? ("lung mass" (3+cm), suspected "metastatic disease," nodules >80mm with high-risk characteristics such as "spiculation, pleural tenting, adenopathy," or non-calcified nodule >58mm): Yes      Allergies as of 06/17/2023       Reactions   Penicillin G Hives   Tylenol  [acetaminophen ] Other (See Comments)   Liver        Medication List     TAKE these medications    albuterol  108 (90 Base) MCG/ACT inhaler Commonly known as: VENTOLIN  HFA Inhale into the lungs every 6 (six) hours as needed for wheezing or shortness of breath.   alendronate 70 MG tablet Commonly known as: FOSAMAX Take 70 mg by mouth once a week.   azithromycin  500 MG tablet Commonly known as: Zithromax  Take 1 tablet (500 mg total) by mouth daily for 3 days.   cefdinir  300 MG capsule Commonly known as: OMNICEF  Take 1 capsule (300 mg total) by mouth 2 (two) times daily for 3 days.   D3-50 1.25 MG (50000 UT) capsule Generic drug: Cholecalciferol Take 50,000 Units by mouth once a week.   DULoxetine  60 MG capsule Commonly known as: CYMBALTA  Take 60 mg by mouth 2 (two) times daily.   fluticasone-salmeterol 250-50 MCG/ACT Aepb Commonly known as: ADVAIR 1 puff 2 (two) times daily as needed.  hydroxychloroquine  200 MG tablet Commonly known as: Plaquenil  Take 1 tablet (200 mg total) by mouth daily.   levothyroxine  100 MCG tablet Commonly known as: SYNTHROID  Take 100 mcg by mouth daily.   morphine  30 MG 12 hr tablet Commonly known as: MS CONTIN  Take 30 mg by mouth every 12 (twelve) hours.   MULTIVITAMIN PO Take by mouth.    pantoprazole  40 MG tablet Commonly known as: PROTONIX  Take 40 mg by mouth daily.   pregabalin  300 MG capsule Commonly known as: LYRICA  Take 300 mg by mouth 2 (two) times daily.   tiZANidine  4 MG capsule Commonly known as: ZANAFLEX  Take 4 mg by mouth 3 (three) times daily.   tiZANidine  4 MG tablet Commonly known as: ZANAFLEX  Take 4 mg by mouth 3 (three) times daily as needed.        Follow-up Information     Omie Bickers, MD. Schedule an appointment as soon as possible for a visit in 1 week(s).   Specialty: Internal Medicine Contact information: [address] [place] [address] [phone]         [facility]. Go to.   Specialty: Pulmonology Contact information: [address] [place]  [phone] Additional information: [address]  [address]  [place], [place] [address]               Allergies  Allergen Reactions   Penicillin G Hives   Tylenol  [Acetaminophen ] Other (See Comments)    Liver    Consultations: None   Procedures/Studies: CT Angio Chest PE W and/or Wo Contrast Result Date: 06/15/2023 CLINICAL DATA:  Shortness of breath and weakness. EXAM: CT ANGIOGRAPHY CHEST WITH CONTRAST TECHNIQUE: Multidetector CT imaging of the chest was performed using the standard protocol during bolus administration of intravenous contrast. Multiplanar CT image reconstructions and MIPs were obtained to evaluate the vascular anatomy. RADIATION DOSE REDUCTION: This exam was performed according to the departmental dose-optimization program which includes automated exposure control, adjustment of the mA and/or kV according to patient size and/or use of iterative reconstruction technique. CONTRAST:  75mL OMNIPAQUE  IOHEXOL  350 MG/ML SOLN COMPARISON:  None Available. FINDINGS: Cardiovascular: There is mild calcification of the aortic arch, without evidence of aortic aneurysm. Satisfactory opacification of the  pulmonary arteries to the segmental level. No evidence of pulmonary embolism. Normal heart size with marked severity coronary artery calcification. No pericardial effusion. Mediastinum/Nodes: No enlarged mediastinal, hilar, or axillary lymph nodes. Thyroid  gland, trachea, and esophagus demonstrate no significant findings. Lungs/Pleura: Marked severity posterior left upper lobe and left lower lobe infiltrates are seen. 11 mm and 14 mm underlying pulmonary nodules are seen within the posterior left upper lobe (axial CT images 48 through 57, CT series 6). A 3.0 cm x 2.0 cm ill-defined mass-like area is seen within the left lower lobe. There is no evidence of a pleural effusion or pneumothorax. Upper Abdomen: Surgical staples are seen within the gastric region. Musculoskeletal: A spinal stimulator wire is seen with its distal tip at the approximate level of T7. Review of the MIP images confirms the above findings. IMPRESSION: 1. Marked severity left upper lobe and left lower lobe infiltrates with underlying nodular and mass-like areas concerning for the presence of an underlying neoplastic process. Pulmonology and oncology consultation with follow-up nuclear medicine PET scan and/or tissue sampling is recommended. This recommendation follows the consensus statement: Guidelines for Management of Incidental Pulmonary Nodules Detected on CT Images: From the Fleischner Society 2017; Radiology 2017;  [id number]. 2. No evidence of acute pulmonary embolism. 3. Postoperative changes consistent with prior gastric stapling. Electronically Signed   By: Virgle Grime M.D.   On: 06/15/2023 23:49   CT Head Wo Contrast Result Date: 06/15/2023 CLINICAL DATA:  Weakness. EXAM: CT HEAD WITHOUT CONTRAST TECHNIQUE: Contiguous axial images were obtained from the base of the skull through the vertex without intravenous contrast. RADIATION DOSE REDUCTION: This exam was performed according to the departmental dose-optimization program  which includes automated exposure control, adjustment of the mA and/or kV according to patient size and/or use of iterative reconstruction technique. COMPARISON:  None Available. FINDINGS: Brain: There is mild cerebral atrophy with widening of the extra-axial spaces and ventricular dilatation. There are areas of decreased attenuation within the white matter tracts of the supratentorial brain, consistent with microvascular disease changes. Chronic bilateral basal ganglia lacunar infarcts are noted. Vascular: Marked severity bilateral cavernous carotid artery calcification is seen. Skull: Normal. Negative for fracture or focal lesion. Sinuses/Orbits: No acute finding. Other: None. IMPRESSION: 1. No acute intracranial abnormality. 2. Generalized cerebral atrophy with chronic bilateral basal ganglia lacunar infarcts and chronic white matter small vessel ischemic changes. Electronically Signed   By: Virgle Grime M.D.   On: 06/15/2023 20:03   DG Chest Port 1 View Result Date: 06/15/2023 CLINICAL DATA:  Shortness of breath and weakness. EXAM: PORTABLE CHEST 1 VIEW COMPARISON:  None Available. FINDINGS: The heart size and mediastinal contours are within normal limits. Low lung volumes are noted. Left-sided volume loss is also seen. There is to moderate severity left basilar atelectasis and/or infiltrate. Small, ill-defined focal areas of atelectasis and/or infiltrate are also seen along the periphery of the right lung. No pleural effusion or pneumothorax is identified. A spinal stimulator wire is seen with multilevel degenerative changes noted throughout the thoracic spine. IMPRESSION: 1. Findings concerning for the presence of multifocal atelectasis and/or infiltrate, most prominent within the left lung base. Correlation with chest CT is recommended. Electronically Signed   By: Virgle Grime M.D.   On: 06/15/2023 19:59     Discharge Exam: Vitals:   06/17/23 0306 06/17/23 0925  BP: (!) 140/58 (!) 153/59   Pulse: 65 69  Resp: 16 18  Temp: 98.7 F (37.1 C) 98.7 F (37.1 C)  SpO2: 92% 95%   Vitals:   06/16/23 2117 06/16/23 2123 06/17/23 0306 06/17/23 0925  BP: 91/79  (!) 140/58 (!) 153/59  Pulse: 66 67 65 69  Resp:  18 16 18   Temp:  97.9 F (36.6 C) 98.7 F (37.1 C) 98.7 F (37.1 C)  TempSrc:  Oral Oral Oral  SpO2:  94% 92% 95%  Weight:      Height:        General: Pt is alert, awake, not in acute distress Cardiovascular: RRR, S1/S2 +, no rubs, no gallops Respiratory: CTA bilaterally, no wheezing, no rhonchi Abdominal: Soft, NT, ND, bowel sounds + Extremities: no edema, no cyanosis    The results of significant diagnostics from this hospitalization (including imaging, microbiology, ancillary and laboratory) are listed below for reference.     Microbiology: Recent Results (from the past 240 hours)  Resp panel by RT-PCR (RSV, Flu A&B, Covid) Anterior Nasal Swab     Status: None   Collection Time: 06/15/23  4:45 PM   Specimen: Anterior Nasal Swab  Result Value Ref Range Status   SARS Coronavirus 2 by RT PCR NEGATIVE NEGATIVE Final    Comment: (NOTE) SARS-CoV-2 target nucleic acids are NOT DETECTED.  The  SARS-CoV-2 RNA is generally detectable in upper respiratory specimens during the acute phase of infection. The lowest concentration of SARS-CoV-2 viral copies this assay can detect is 138 copies/mL. A negative result does not preclude SARS-Cov-2 infection and should not be used as the sole basis for treatment or other patient management decisions. A negative result may occur with  improper specimen collection/handling, submission of specimen other than nasopharyngeal swab, presence of viral mutation(s) within the areas targeted by this assay, and inadequate number of viral copies(<138 copies/mL). A negative result must be combined with clinical observations, patient history, and epidemiological information. The expected result is Negative.  Fact Sheet for Patients:   BloggerCourse.com  Fact Sheet for Healthcare Providers:  SeriousBroker.it  This test is no t yet approved or cleared by the [place]  FDA and  has been authorized for detection and/or diagnosis of SARS-CoV-2 by FDA under an Emergency Use Authorization (EUA). This EUA will remain  in effect (meaning this test can be used) for the duration of the COVID-19 declaration under Section 564(b)(1) of the Act, 21 U.S.C.section 360bbb-3(b)(1), unless the authorization is terminated  or revoked sooner.       Influenza A by PCR NEGATIVE NEGATIVE Final   Influenza B by PCR NEGATIVE NEGATIVE Final    Comment: (NOTE) The Xpert Xpress SARS-CoV-2/FLU/RSV plus assay is intended as an aid in the diagnosis of influenza from Nasopharyngeal swab specimens and should not be used as a sole basis for treatment. Nasal washings and aspirates are unacceptable for Xpert Xpress SARS-CoV-2/FLU/RSV testing.  Fact Sheet for Patients: BloggerCourse.com  Fact Sheet for Healthcare Providers: SeriousBroker.it  This test is not yet approved or cleared by the [place]  FDA and has been authorized for detection and/or diagnosis of SARS-CoV-2 by FDA under an Emergency Use Authorization (EUA). This EUA will remain in effect (meaning this test can be used) for the duration of the COVID-19 declaration under Section 564(b)(1) of the Act, 21 U.S.C. section 360bbb-3(b)(1), unless the authorization is terminated or revoked.     Resp Syncytial Virus by PCR NEGATIVE NEGATIVE Final    Comment: (NOTE) Fact Sheet for Patients: BloggerCourse.com  Fact Sheet for Healthcare Providers: SeriousBroker.it  This test is not yet approved or cleared by the [place]  FDA and has been authorized for detection and/or diagnosis of SARS-CoV-2 by FDA under an Emergency Use  Authorization (EUA). This EUA will remain in effect (meaning this test can be used) for the duration of the COVID-19 declaration under Section 564(b)(1) of the Act, 21 U.S.C. section 360bbb-3(b)(1), unless the authorization is terminated or revoked.  Performed at [facility], [address], [place], [place] [address]   Blood culture (routine x 2)     Status: None (Preliminary result)   Collection Time: 06/15/23  4:48 PM   Specimen: BLOOD LEFT ARM  Result Value Ref Range Status   Specimen Description BLOOD LEFT ARM  Final   Special Requests   Final    BOTTLES DRAWN AEROBIC AND ANAEROBIC Blood Culture adequate volume   Culture   Final    NO GROWTH 2 DAYS Performed at [facility], [address], [place], [place] [address]    Report Status PENDING  Incomplete  Blood culture (routine x 2)     Status: None (Preliminary result)   Collection Time: 06/15/23  5:11 PM   Specimen: BLOOD LEFT HAND  Result Value Ref Range Status   Specimen Description BLOOD LEFT HAND  Final   Special Requests   Final  BOTTLES DRAWN AEROBIC ONLY Blood Culture adequate volume   Culture   Final    NO GROWTH 2 DAYS Performed at [facility], [address], [place], [place] [address]    Report Status PENDING  Incomplete     Labs: BNP (last 3 results) No results for input(s): "BNP" in the last 8760 hours. Basic Metabolic Panel: Recent Labs  Lab 06/15/23 1623 06/16/23 0501 06/17/23 0418  NA 136 142 141  K 3.6 4.9 3.8  CL 101 106 107  CO2 24 26 25   GLUCOSE 147* 163* 99  BUN 17 16 11   CREATININE 0.79 0.58 0.60  CALCIUM 8.3* 9.1 8.7*  MG  --  2.0 1.8  PHOS  --  3.0  --    Liver Function Tests: Recent Labs  Lab 06/16/23 0501  AST 20  ALT 23  ALKPHOS 65  BILITOT 0.6  PROT 7.0  ALBUMIN 3.2*   No results for input(s): "LIPASE", "AMYLASE" in the last 168 hours. No results for input(s): "AMMONIA" in the last 168 hours. CBC: Recent Labs  Lab 06/15/23 1623 06/16/23 0501 06/17/23 0418  WBC  7.8 5.2 8.6  HGB 10.6* 10.7* 9.8*  HCT 35.6* 35.8* 32.4*  MCV 79.5* 78.5* 77.5*  PLT 159 177 180   Cardiac Enzymes: No results for input(s): "CKTOTAL", "CKMB", "CKMBINDEX", "TROPONINI" in the last 168 hours. BNP: Invalid input(s): "POCBNP" CBG: No results for input(s): "GLUCAP" in the last 168 hours. D-Dimer No results for input(s): "DDIMER" in the last 72 hours. Hgb A1c No results for input(s): "HGBA1C" in the last 72 hours. Lipid Profile No results for input(s): "CHOL", "HDL", "LDLCALC", "TRIG", "CHOLHDL", "LDLDIRECT" in the last 72 hours. Thyroid  function studies No results for input(s): "TSH", "T4TOTAL", "T3FREE", "THYROIDAB" in the last 72 hours.  Invalid input(s): "FREET3" Anemia work up Recent Labs    06/16/23 0508  FERRITIN 86  TIBC 436  IRON 15*   Urinalysis No results found for: "COLORURINE", "APPEARANCEUR", "LABSPEC", "PHURINE", "GLUCOSEU", "HGBUR", "BILIRUBINUR", "KETONESUR", "PROTEINUR", "UROBILINOGEN", "NITRITE", "LEUKOCYTESUR" Sepsis Labs Recent Labs  Lab 06/15/23 1623 06/16/23 0501 06/17/23 0418  WBC 7.8 5.2 8.6   Microbiology Recent Results (from the past 240 hours)  Resp panel by RT-PCR (RSV, Flu A&B, Covid) Anterior Nasal Swab     Status: None   Collection Time: 06/15/23  4:45 PM   Specimen: Anterior Nasal Swab  Result Value Ref Range Status   SARS Coronavirus 2 by RT PCR NEGATIVE NEGATIVE Final    Comment: (NOTE) SARS-CoV-2 target nucleic acids are NOT DETECTED.  The SARS-CoV-2 RNA is generally detectable in upper respiratory specimens during the acute phase of infection. The lowest concentration of SARS-CoV-2 viral copies this assay can detect is 138 copies/mL. A negative result does not preclude SARS-Cov-2 infection and should not be used as the sole basis for treatment or other patient management decisions. A negative result may occur with  improper specimen collection/handling, submission of specimen other than nasopharyngeal swab,  presence of viral mutation(s) within the areas targeted by this assay, and inadequate number of viral copies(<138 copies/mL). A negative result must be combined with clinical observations, patient history, and epidemiological information. The expected result is Negative.  Fact Sheet for Patients:  BloggerCourse.com  Fact Sheet for Healthcare Providers:  SeriousBroker.it  This test is no t yet approved or cleared by the [place]  FDA and  has been authorized for detection and/or diagnosis of SARS-CoV-2 by FDA under an Emergency Use Authorization (EUA). This EUA will remain  in  effect (meaning this test can be used) for the duration of the COVID-19 declaration under Section 564(b)(1) of the Act, 21 U.S.C.section 360bbb-3(b)(1), unless the authorization is terminated  or revoked sooner.       Influenza A by PCR NEGATIVE NEGATIVE Final   Influenza B by PCR NEGATIVE NEGATIVE Final    Comment: (NOTE) The Xpert Xpress SARS-CoV-2/FLU/RSV plus assay is intended as an aid in the diagnosis of influenza from Nasopharyngeal swab specimens and should not be used as a sole basis for treatment. Nasal washings and aspirates are unacceptable for Xpert Xpress SARS-CoV-2/FLU/RSV testing.  Fact Sheet for Patients: BloggerCourse.com  Fact Sheet for Healthcare Providers: SeriousBroker.it  This test is not yet approved or cleared by the [place]  FDA and has been authorized for detection and/or diagnosis of SARS-CoV-2 by FDA under an Emergency Use Authorization (EUA). This EUA will remain in effect (meaning this test can be used) for the duration of the COVID-19 declaration under Section 564(b)(1) of the Act, 21 U.S.C. section 360bbb-3(b)(1), unless the authorization is terminated or revoked.     Resp Syncytial Virus by PCR NEGATIVE NEGATIVE Final    Comment: (NOTE) Fact Sheet for  Patients: BloggerCourse.com  Fact Sheet for Healthcare Providers: SeriousBroker.it  This test is not yet approved or cleared by the [place]  FDA and has been authorized for detection and/or diagnosis of SARS-CoV-2 by FDA under an Emergency Use Authorization (EUA). This EUA will remain in effect (meaning this test can be used) for the duration of the COVID-19 declaration under Section 564(b)(1) of the Act, 21 U.S.C. section 360bbb-3(b)(1), unless the authorization is terminated or revoked.  Performed at [facility], [address], [place], [place] [address]   Blood culture (routine x 2)     Status: None (Preliminary result)   Collection Time: 06/15/23  4:48 PM   Specimen: BLOOD LEFT ARM  Result Value Ref Range Status   Specimen Description BLOOD LEFT ARM  Final   Special Requests   Final    BOTTLES DRAWN AEROBIC AND ANAEROBIC Blood Culture adequate volume   Culture   Final    NO GROWTH 2 DAYS Performed at [facility], [address], [place], [place] [address]    Report Status PENDING  Incomplete  Blood culture (routine x 2)     Status: None (Preliminary result)   Collection Time: 06/15/23  5:11 PM   Specimen: BLOOD LEFT HAND  Result Value Ref Range Status   Specimen Description BLOOD LEFT HAND  Final   Special Requests   Final    BOTTLES DRAWN AEROBIC ONLY Blood Culture adequate volume   Culture   Final    NO GROWTH 2 DAYS Performed at [facility], [address] [place], [place] [address]    Report Status PENDING  Incomplete     Time coordinating discharge: 35 minutes  SIGNED:   Cornelius Dill, DO [facility] 06/17/2023, 10:32 AM  If 7PM-7AM, please contact night-coverage [email or web address]

## 2023-06-17 NOTE — Progress Notes (Signed)
 Mobility Specialist Progress Note:    06/17/23 0945  Mobility  Activity Ambulated with assistance in hallway  Level of Assistance Modified independent, requires aide device or extra time  Assistive Device None  Distance Ambulated (ft) 140 ft  Range of Motion/Exercises Active;All extremities  Activity Response Tolerated well  Mobility Referral Yes  Mobility visit 1 Mobility  Mobility Specialist Start Time (ACUTE ONLY) 0945  Mobility Specialist Stop Time (ACUTE ONLY) 1005  Mobility Specialist Time Calculation (min) (ACUTE ONLY) 20 min   Pt received in bed, agreeable to mobility. ModI to stand and ambulate with no AD. Tolerated well, see O2 sats below. Audible SOB after session. Returned pt supine, all needs met.   SpO2 93% on RA at rest SpO2 90-92% on RA during ambulation  Glinda Lapping Mobility Specialist Please contact via SecureChat or  Rehab office at [phone]

## 2023-06-17 NOTE — Progress Notes (Signed)
 Nurse at bedside,patient alert and oriented times four this am.Patient up ambulating in room without difficulty.Patient did c/o chronic back pain rated a 7,a constant achy,discomfort. Ms contin  30 mg's given by mouth per [facility] scheduled.Plan of care on going.

## 2023-06-17 NOTE — Plan of Care (Signed)

## 2023-06-19 ENCOUNTER — Telehealth: Payer: Self-pay | Admitting: *Deleted

## 2023-06-19 NOTE — Transitions of Care (Post Inpatient/ED Visit) (Unsigned)
   06/19/2023  Name: Dawn Hughes MRN: [id number] DOB: 02-24-1956  Today's TOC FU Call Status:   Patient's Name and Date of Birth confirmed.  Transition Care Management Follow-up Telephone Call    Items Reviewed:    Medications Reviewed Today: Medications Reviewed Today     Reviewed by Claudene Crystal, RN (Case Manager) on 06/19/23 at 1243  Med List Status: <None>   Medication Order Taking? Sig Documenting Provider Last Dose Status Informant  albuterol  (VENTOLIN  HFA) 108 (90 Base) MCG/ACT inhaler [id number] No Inhale into the lungs every 6 (six) hours as needed for wheezing or shortness of breath.  Patient not taking: Reported on 05/06/2023   [provider] Not Taking Active Self, Pharmacy Records  alendronate (FOSAMAX) 70 MG tablet [id number] No Take 70 mg by mouth once a week. [provider] 06/14/2023 Active Self, Pharmacy Records  azithromycin  (ZITHROMAX ) 500 MG tablet [id number]  Take 1 tablet (500 mg total) by mouth daily for 3 days. Mason Sole, Pratik D, DO  Active   cefdinir  (OMNICEF ) 300 MG capsule [id number]  Take 1 capsule (300 mg total) by mouth 2 (two) times daily for 3 days. Mason Sole, Pratik D, DO  Active   D3-50 1.25 MG (50000 UT) capsule [id number] No Take 50,000 Units by mouth once a week. [provider] 06/14/2023 Active Self, Pharmacy Records  DULoxetine  (CYMBALTA ) 60 MG capsule [id number] No Take 60 mg by mouth 2 (two) times daily. [provider] 06/15/2023 Morning Active Self, Pharmacy Records  fluticasone-salmeterol (ADVAIR) 250-50 MCG/ACT AEPB [id number] No 1 puff 2 (two) times daily as needed. [provider] 06/15/2023 Morning Active Self, Pharmacy Records  hydroxychloroquine  (PLAQUENIL ) 200 MG tablet [id number] No Take 1 tablet (200 mg total) by mouth daily. Nicholas Bari, MD 06/15/2023 Morning Active Self, Pharmacy Records  levothyroxine  (SYNTHROID ) 100 MCG tablet [id number] No Take 100 mcg by mouth daily. [provider] 06/15/2023 Morning Active Self, Pharmacy Records  morphine  (MS CONTIN ) 30 MG 12 hr tablet [id number] No Take 30 mg by mouth every 12 (twelve) hours. [provider] 06/15/2023 Morning Active Self, Pharmacy Records  Multiple Vitamin (MULTIVITAMIN PO) [id number] No Take by mouth. [provider] 06/15/2023 Morning Active Self, Pharmacy Records  pantoprazole  (PROTONIX ) 40 MG tablet [id number] No Take 40 mg by mouth daily. [provider] Past Month Active Self, Pharmacy Records  pregabalin  (LYRICA ) 300 MG capsule [id number] No Take 300 mg by mouth 2 (two) times daily. [provider] 06/15/2023 Morning Active Self, Pharmacy Records  tiZANidine  (ZANAFLEX ) 4 MG capsule [id number] No Take 4 mg by mouth 3 (three) times daily. [provider] Taking Active Self, Pharmacy Records  tiZANidine  (ZANAFLEX ) 4 MG tablet [id number] No Take 4 mg by mouth 3 (three) times daily as needed. [provider] 06/15/2023 Morning Active Self, Pharmacy Records            Medication reviewed. See [employer] for complete note.  Message sent to the provider for refill on Albuterol  HFA    Gareld June, BSN, RN [place]  [facility] - [facility] RN Care Manager [phone]

## 2023-06-19 NOTE — Telephone Encounter (Signed)
 Copied from CRM [id number]. Topic: Appointments - Scheduling Inquiry for Clinic >> Jun 19, 2023  2:01 PM Juliana Ocean wrote: Reason for CRM: pt needs hospital follow up for nodules found when she had PNA. [phone]  06/19/23 LVM for patient to call and discuss scheduling

## 2023-06-20 LAB — CULTURE, BLOOD (ROUTINE X 2)
Culture: NO GROWTH
Culture: NO GROWTH
Special Requests: ADEQUATE
Special Requests: ADEQUATE

## 2023-06-24 ENCOUNTER — Other Ambulatory Visit (HOSPITAL_COMMUNITY): Payer: Self-pay | Admitting: Family Medicine

## 2023-06-24 DIAGNOSIS — Z1231 Encounter for screening mammogram for malignant neoplasm of breast: Secondary | ICD-10-CM | POA: Diagnosis not present

## 2023-06-24 DIAGNOSIS — Z13820 Encounter for screening for osteoporosis: Secondary | ICD-10-CM

## 2023-06-24 DIAGNOSIS — Z87891 Personal history of nicotine dependence: Secondary | ICD-10-CM | POA: Diagnosis not present

## 2023-06-24 DIAGNOSIS — D649 Anemia, unspecified: Secondary | ICD-10-CM | POA: Diagnosis not present

## 2023-06-24 DIAGNOSIS — R918 Other nonspecific abnormal finding of lung field: Secondary | ICD-10-CM | POA: Diagnosis not present

## 2023-06-27 ENCOUNTER — Other Ambulatory Visit: Payer: Self-pay | Admitting: Physician Assistant

## 2023-06-29 NOTE — Telephone Encounter (Signed)
 Last Fill: 05/06/2023  Eye exam: normal  04/08/2022    Labs: 06/17/2023 Hgb 9.8, Hct 32.4, MCV 77.5, MCH 23.4, RDW 16.3, Calcium 8.7  Next Visit: 10/06/2023  Last Visit: 05/06/2023  [id number] arthritis with rheumatoid factor of multiple sites without organ or systems involvement   Current Dose per office note 05/06/2023: hydroxychloroquine  200 mg daily instead of twice daily Monday to Friday   Okay to refill Plaquenil ?

## 2023-07-01 DIAGNOSIS — G894 Chronic pain syndrome: Secondary | ICD-10-CM | POA: Diagnosis not present

## 2023-07-01 DIAGNOSIS — M4726 Other spondylosis with radiculopathy, lumbar region: Secondary | ICD-10-CM | POA: Diagnosis not present

## 2023-07-01 DIAGNOSIS — M4722 Other spondylosis with radiculopathy, cervical region: Secondary | ICD-10-CM | POA: Diagnosis not present

## 2023-07-01 DIAGNOSIS — G5603 Carpal tunnel syndrome, bilateral upper limbs: Secondary | ICD-10-CM | POA: Diagnosis not present

## 2023-07-08 ENCOUNTER — Ambulatory Visit: Admitting: Pulmonary Disease

## 2023-07-08 ENCOUNTER — Encounter: Payer: Self-pay | Admitting: Pulmonary Disease

## 2023-07-08 VITALS — BP 114/70 | HR 73 | Temp 97.1°F | Ht 61.0 in | Wt 167.0 lb

## 2023-07-08 DIAGNOSIS — K219 Gastro-esophageal reflux disease without esophagitis: Secondary | ICD-10-CM

## 2023-07-08 DIAGNOSIS — R911 Solitary pulmonary nodule: Secondary | ICD-10-CM

## 2023-07-08 DIAGNOSIS — Z87891 Personal history of nicotine dependence: Secondary | ICD-10-CM | POA: Diagnosis not present

## 2023-07-08 DIAGNOSIS — J449 Chronic obstructive pulmonary disease, unspecified: Secondary | ICD-10-CM | POA: Diagnosis not present

## 2023-07-08 NOTE — Progress Notes (Signed)
 Synopsis: Referred in by Doreene Gammon D, DO   Subjective:   PATIENT ID: Dawn Hughes GENDER: female DOB: [birth year], MRN: [id number]  Chief Complaint  Patient presents with   Consult    DOE. Wheezing at times. Little dry cough.    HPI This is a case of a 68 year old female patient with a past medical history of anxiety, hypothyroidism, COPD, GERD, neuropathic pain, seropositive rheumatoid arthritis on Plaquenil  presenting pulmonary clinic for evaluation of pulmonary nodules.  She presented to the ED on 4/21 with complaints of fever and cough.  As part of the workup a chest x-ray was obtained which showed multifocal infiltrate most prominent in the left lung base which prompted his CT chest 04/21.  CTA chest 04/21 without signs of PE but did show infiltrates in the left upper lobe and left lower lobe ill-defined masslike area measuring 3 x 2 cm.  Currently still experiencing fatigue, no significant weight changes.  Appetite is okay on days and bad on other days.  She denies any hemoptysis, does report significant dyspnea specifically on exertion.  Family history -no family history of lung cancer.  Social history  -previous smoker quit in 2011 smoked 1 pack/day since her teens.  ROS All systems were reviewed and are negative except for the above Objective:   Vitals:   07/08/23 1024  BP: 114/70  Pulse: 73  Temp: (!) 97.1 F (36.2 C)  SpO2: 94%  Weight: 167 lb (75.8 kg)  Height: 5\' 1"  (1.549 m)   94% on  RA BMI Readings from Last 3 Encounters:  07/08/23 31.55 kg/m  06/15/23 30.23 kg/m  05/06/23 31.06 kg/m   Wt Readings from Last 3 Encounters:  07/08/23 167 lb (75.8 kg)  06/15/23 160 lb (72.6 kg)  05/06/23 164 lb 6.4 oz (74.6 kg)    Physical Exam GEN: NAD, Healthy Appearing HEENT: Supple Neck, Reactive Pupils, EOMI  CVS: Normal S1, Normal S2, RRR, No murmurs or ES appreciated  Lungs: Diminished air entry over the left hemithorax Abdomen: Soft, non tender,  non distended, + BS  Extremities: Warm and well perfused, No edema  Skin: No suspicious lesions appreciated  Psych: Normal Affect  Ancillary Information   CBC    Component Value Date/Time   WBC 8.6 06/17/2023 0418   RBC 4.18 06/17/2023 0418   HGB 9.8 (L) 06/17/2023 0418   HCT 32.4 (L) 06/17/2023 0418   PLT 180 06/17/2023 0418   MCV 77.5 (L) 06/17/2023 0418   MCH 23.4 (L) 06/17/2023 0418   MCHC 30.2 06/17/2023 0418   RDW 16.3 (H) 06/17/2023 0418   LYMPHSABS 1,040 11/13/2022 1021   MONOABS 0.5 05/09/2022 1138   EOSABS 202 05/06/2023 1226   BASOSABS 33 05/06/2023 1226  Labs and imaging were reviewed.     No data to display           Assessment & Plan:  This is a case of a 68 year old female patient with a past medical history of anxiety, hypothyroidism, COPD, GERD, neuropathic pain, seropositive rheumatoid arthritis on Plaquenil  presenting pulmonary clinic for evaluation of pulmonary nodules.  # Left upper lobe and left lower lobe infiltrative disease with ill-defined masslike opacity measuring 3 x 2 cm.  She does have a history of smoking, appearance could be consistent with an infectious process however malignancy certainly is of concern.   []  Repeat CT chest w/ contrast in 2 weeks.   Discussed that based on the latter we will decide on PET scan and proceeding with  RANB EBUS and tbna.   #COPD w/ no pfts on file   []  Obtain PFTs.  []  Currently on Advair and albuterol  as needed.   Return in about 3 months (around 10/08/2023).  I spent 60 minutes caring for this patient today, including preparing to see the patient, obtaining a medical history , reviewing a separately obtained history, performing a medically appropriate examination and/or evaluation, counseling and educating the patient/family/caregiver, ordering medications, tests, or procedures, documenting clinical information in the electronic health record, and independently interpreting results (not separately  reported/billed) and communicating results to the patient/family/caregiver  Annitta Kindler, MD [facility] 07/08/2023 12:07 PM

## 2023-07-23 ENCOUNTER — Ambulatory Visit (HOSPITAL_COMMUNITY)
Admission: RE | Admit: 2023-07-23 | Discharge: 2023-07-23 | Disposition: A | Discharge status: Home / Self Care | Source: Ambulatory Visit | Attending: Pulmonary Disease | Admitting: Pulmonary Disease

## 2023-07-23 DIAGNOSIS — R911 Solitary pulmonary nodule: Secondary | ICD-10-CM | POA: Insufficient documentation

## 2023-07-23 DIAGNOSIS — R918 Other nonspecific abnormal finding of lung field: Secondary | ICD-10-CM | POA: Diagnosis not present

## 2023-07-23 MED ORDER — IOHEXOL 300 MG/ML  SOLN
75.0000 mL | Freq: Once | INTRAMUSCULAR | Status: AC | PRN
  Administered 2023-07-23: 75 mL via INTRAVENOUS

## 2023-07-24 ENCOUNTER — Telehealth: Payer: Self-pay | Admitting: Pulmonary Disease

## 2023-07-24 DIAGNOSIS — J189 Pneumonia, unspecified organism: Secondary | ICD-10-CM

## 2023-07-24 LAB — POCT I-STAT CREATININE: Creatinine, Ser: 0.9 mg/dL (ref 0.44–1.00)

## 2023-07-24 NOTE — Telephone Encounter (Signed)
 Discussed resulted of the CT chest done on 07/23/2023. Opacities are almost completely resolved and decreased in size. Consistent with CAP. Plan is to repeat a CT chest wo contrast in 3 months.   Annitta Kindler, MD  Pulmonary Critical Care 07/24/2023 10:07 AM

## 2023-07-27 ENCOUNTER — Ambulatory Visit (HOSPITAL_COMMUNITY)
Admission: RE | Admit: 2023-07-27 | Discharge: 2023-07-27 | Disposition: A | Discharge status: Home / Self Care | Source: Ambulatory Visit | Attending: Family Medicine | Admitting: Family Medicine

## 2023-07-27 DIAGNOSIS — M810 Age-related osteoporosis without current pathological fracture: Secondary | ICD-10-CM | POA: Diagnosis not present

## 2023-07-27 DIAGNOSIS — Z780 Asymptomatic menopausal state: Secondary | ICD-10-CM | POA: Diagnosis not present

## 2023-07-27 DIAGNOSIS — Z13820 Encounter for screening for osteoporosis: Secondary | ICD-10-CM | POA: Insufficient documentation

## 2023-09-02 DIAGNOSIS — G5603 Carpal tunnel syndrome, bilateral upper limbs: Secondary | ICD-10-CM | POA: Diagnosis not present

## 2023-09-02 DIAGNOSIS — G894 Chronic pain syndrome: Secondary | ICD-10-CM | POA: Diagnosis not present

## 2023-09-02 DIAGNOSIS — M4726 Other spondylosis with radiculopathy, lumbar region: Secondary | ICD-10-CM | POA: Diagnosis not present

## 2023-09-02 DIAGNOSIS — M4722 Other spondylosis with radiculopathy, cervical region: Secondary | ICD-10-CM | POA: Diagnosis not present

## 2023-09-22 NOTE — Progress Notes (Unsigned)
 Office Visit Note  Patient: Dawn Hughes             Date of Birth: 07/22/1955           MRN: [id number]             PCP: Shona Norleen PEDLAR, MD Referring: Shona Norleen PEDLAR, MD Visit Date: 10/06/2023 Occupation: @GUAROCC @  Subjective:  Medication monitoring   History of Present Illness: Dawn Hughes is a 68 y.o. female with history of seropositive rheumatoid arthritis and osteoarthritis. Patient remains on  Plaquenil  200 mg 1 tablet by mouth QD. She is tolerating plaquenil  without any side effects.  She denies missing any gaps in therapy.  She denies any signs or symptoms of a rheumatoid arthritis flare.  Her morning stiffness continues to last 1 to 2 hours daily.  She denies any nocturnal pain or difficulty performing ADLs.  Patient has not yet scheduled an updated Plaquenil  eye examination.  Patient states she was recently hospitalized for 4 days while treated for pneumonia.  Patient denies any other recent or recurrent infections.   Activities of Daily Living:  Patient reports morning stiffness for 1-2 hours.   Patient Denies nocturnal pain.  Difficulty dressing/grooming: Reports Difficulty climbing stairs: Reports Difficulty getting out of chair: Reports Difficulty using hands for taps, buttons, cutlery, and/or writing: Reports  Review of Systems  Constitutional:  Positive for fatigue.  HENT:  Positive for mouth sores and mouth dryness.   Eyes:  Negative for dryness.  Cardiovascular:  Negative for chest pain and palpitations.  Gastrointestinal:  Positive for constipation and diarrhea. Negative for blood in stool.  Endocrine: Negative for increased urination.  Genitourinary:  Negative for involuntary urination.  Musculoskeletal:  Positive for joint pain, joint pain, joint swelling, myalgias, morning stiffness and myalgias. Negative for gait problem, muscle weakness and muscle tenderness.  Skin:  Positive for color change, hair loss and sensitivity to sunlight. Negative for rash.   Allergic/Immunologic: Negative for susceptible to infections.  Neurological:  Negative for dizziness and headaches.  Hematological:  Negative for swollen glands.  Psychiatric/Behavioral:  Positive for depressed mood and sleep disturbance. The patient is nervous/anxious.     PMFS History:  Patient Active Problem List   Diagnosis Date Noted   Acute respiratory failure with hypoxia (HCC) 06/16/2023   Lactic acidosis 06/16/2023   Microcytic anemia 06/16/2023   Pulmonary nodule 06/16/2023   Pulmonary mass 06/16/2023   Acquired hypothyroidism 06/16/2023   GERD (gastroesophageal reflux disease) 06/16/2023   COPD (chronic obstructive pulmonary disease) (HCC) 06/16/2023   CAP (community acquired pneumonia) 06/15/2023   Rheumatoid arthritis with rheumatoid factor of multiple sites without organ or systems involvement (HCC) 05/06/2023   Primary osteoarthritis of both hands 05/06/2023   Primary osteoarthritis of left hip 05/06/2023   Chondrocalcinosis 05/06/2023   Chondromalacia of both patellae 05/06/2023   Primary osteoarthritis of both feet 05/06/2023   Fibromyalgia 05/06/2023   Chronic pain syndrome 05/06/2023   Vulvar ulceration 06/18/2020   Itching in the vaginal area 06/18/2020    Past Medical History:  Diagnosis Date   COPD (chronic obstructive pulmonary disease) (HCC)    Fibromyalgia    GAD (generalized anxiety disorder)    GERD (gastroesophageal reflux disease)    Hypertension    Hypothyroidism    Lung nodules    Other intervertebral disc degeneration, lumbar region    Sleep apnea     Family History  Problem Relation Age of Onset   Diabetes Mother    Heart disease  Mother    Diabetes Father    Breast cancer Paternal Grandmother    Asthma Daughter    Fibroids Daughter    Rheum arthritis Daughter    Cancer Daughter    Asthma Son    Psoriasis Son    Past Surgical History:  Procedure Laterality Date   CARPAL TUNNEL RELEASE Right 08/05/2022   Procedure: RIGHT CARPAL  TUNNEL RELEASE;  Surgeon: Murrell Drivers, MD;  Location: [facility];  Service: Orthopedics;  Laterality: Right;   CHOLECYSTECTOMY     DILATION AND CURETTAGE OF UTERUS     LAPAROSCOPIC ASSISTED VAGINAL HYSTERECTOMY     SPINAL CORD STIMULATOR INSERTION     stomach stapling  1980   Social History   Social History Narrative   Not on file   Immunization History  Administered Date(s) Administered   Fluad Quad(high Dose 65+) 10/26/2022     Objective: Vital Signs: BP 136/80 (BP Location: Left Arm, Patient Position: Sitting, Cuff Size: Normal)   Pulse 66   Resp 14   Ht 5' 1 (1.549 m)   Wt 163 lb (73.9 kg)   BMI 30.80 kg/m    Physical Exam Vitals and nursing note reviewed.  Constitutional:      Appearance: She is well-developed.  HENT:     Head: Normocephalic and atraumatic.  Eyes:     Conjunctiva/sclera: Conjunctivae normal.  Cardiovascular:     Rate and Rhythm: Normal rate and regular rhythm.     Heart sounds: Normal heart sounds.  Pulmonary:     Effort: Pulmonary effort is normal.     Breath sounds: Normal breath sounds.  Abdominal:     General: Bowel sounds are normal.     Palpations: Abdomen is soft.  Musculoskeletal:     Cervical back: Normal range of motion.  Lymphadenopathy:     Cervical: No cervical adenopathy.  Skin:    General: Skin is warm and dry.     Capillary Refill: Capillary refill takes less than 2 seconds.  Neurological:     Mental Status: She is alert and oriented to person, place, and time.  Psychiatric:        Behavior: Behavior normal.      Musculoskeletal Exam: C-spine has limited range of motion with lateral rotation.  Postural thoracic kyphosis noted.  Shoulder joints have good range of motion.  Elbow joints, wrist joints, MCPs, PIPs, DIPs have good range of motion with no synovitis.  PIP and DIP thickening consistent with osteoarthritis of both hands.  Complete fist formation bilaterally.  Hip joints have good range of motion  with mild discomfort in the left groin.  Knee joints have good range of motion with no warmth or effusion.  Ankle joints have good range of motion with no tenderness or synovitis.  No tenderness or synovitis over MTP joints.  CDAI Exam: CDAI Score: -- Patient Global: --; Provider Global: -- Swollen: --; Tender: -- Joint Exam 10/06/2023   No joint exam has been documented for this visit   There is currently no information documented on the homunculus. Go to the Rheumatology activity and complete the homunculus joint exam.  Investigation: No additional findings.  Imaging: No results found.  Recent Labs: Lab Results  Component Value Date   WBC 8.6 06/17/2023   HGB 9.8 (L) 06/17/2023   PLT 180 06/17/2023   NA 141 06/17/2023   K 3.8 06/17/2023   CL 107 06/17/2023   CO2 25 06/17/2023   GLUCOSE 99 06/17/2023  BUN 11 06/17/2023   CREATININE 0.90 07/23/2023   BILITOT 0.6 06/16/2023   ALKPHOS 65 06/16/2023   AST 20 06/16/2023   ALT 23 06/16/2023   PROT 7.0 06/16/2023   ALBUMIN 3.2 (L) 06/16/2023   CALCIUM 8.7 (L) 06/17/2023    Speciality Comments: PLQ eye exam normal  04/08/2022 [facility] f/u 12 months  Procedures:  No procedures performed Allergies: Tylenol  [acetaminophen ] and Penicillin g   Assessment / Plan:     Visit Diagnoses: Rheumatoid arthritis with rheumatoid factor of multiple sites without organ or systems involvement (HCC) - Rheumatoid factor was 27, anti-CCP negative, ESR 36, CRP within normal limits on 03/05/2022: She has no synovitis on examination today.  She has not had any signs or symptoms of a rheumatoid arthritis flare.  She has clinically been doing well taking Plaquenil  200 mg 1 tablet by mouth daily.  She is tolerating Plaquenil  without any side effects and has not had any recent gaps in therapy.  She continues to experience morning stiffness but has not had any joint inflammation.  No nocturnal pain or difficulty performing ADLs.  No  medication changes will be made at this time.  Refill Plaquenil  sent to the pharmacy today as requested.  She was advised to notify us  if she develops signs or symptoms of a flare.  She will follow-up in the office in 5 months or sooner if needed.  High risk medication use - Plaquenil  200 mg 1 tablet by mouth QD.  PLQ eye exam normal 04/08/2022 [facility] f/u 12 months.  Patient was given a new Plaquenil  examination form to take with her to her next appointment.  She plans to call her ophthalmologist to schedule a new visit. CBC and BMP updated on 06/22/23.   Orders for CBC and CMP released today. Lipid panel obtained November 2024-ordered by PCP. - Plan: CBC with Differential/Platelet, Comprehensive metabolic panel with GFR  Primary osteoarthritis of both hands: She has PIP and DIP thickening consistent with osteoarthritis of both hands.  No synovitis noted on exam.  Complete fist formation bilaterally.  Primary osteoarthritis of left hip: Intermittent groin pain.  No difficulty rising from a seated position.  Chondrocalcinosis - XR 03/05/22: Mild chondromalacia patella and chondrocalcinosis.  No signs or symptoms of a pseudogout flare.  No effusion of the knee joints noted.  Chondromalacia of both patellae: She is good range of motion of both knees with mild crepitus but no warmth or effusion noted.  Primary osteoarthritis of both feet: Patient experiences some increased discomfort in her feet after standing for prolonged periods of time especially while working.  She has good range of motion of both ankle joints with no tenderness or synovitis.  Positive ANA (antinuclear antibody) - 03/05/22: ANA 1:40 cytoplasmic, 1:320Nuclear, nucleolar.  dsDNA-, complements within normal limits, Smith antibody negative.  No clinical features of lupus.  Fibromyalgia - She is under the care of Dr. Orlando. She remains on Cymbalta  , Lyrica  300 mg twice daily, and tizanidine  4 mg 3 times  daily.  Bilateral carpal tunnel syndrome: Not currently symptomatic.  Chronic pain syndrome - Lyrica , Cymbalta , and tizanidine  as prescribed.  She also takes morphine  30 mg every 12 hours for pain relief.  History of osteoporosis - She remains on Fosamax 70 mg 1 tablet by mouth once weekly.  Vitamin D deficiency: She is taking vitamin D 50,000 units once weekly.  Other medical conditions are listed as follows:  History of hypothyroidism  History of  hyperlipidemia: Patient had an updated cholesterol panel checked by her PCP--we will call to obtain these records.  Essential hypertension: Blood pressure is 136/80 today in the office.  Prediabetes  History of COPD  Orders: Orders Placed This Encounter  Procedures   CBC with Differential/Platelet   Comprehensive metabolic panel with GFR   Meds ordered this encounter  Medications   hydroxychloroquine  (PLAQUENIL ) 200 MG tablet    Sig: Take 1 tablet (200 mg total) by mouth daily.    Dispense:  90 tablet    Refill:  0   Follow-Up Instructions: Return in about 5 months (around 03/07/2024) for Rheumatoid arthritis.   Waddell CHRISTELLA Craze, PA-C  Note - This record has been created using Dragon software.  Chart creation errors have been sought, but may not always  have been located. Such creation errors do not reflect on  the standard of medical care.

## 2023-10-06 ENCOUNTER — Ambulatory Visit: Attending: Physician Assistant | Admitting: Physician Assistant

## 2023-10-06 ENCOUNTER — Encounter: Payer: Self-pay | Admitting: Physician Assistant

## 2023-10-06 VITALS — BP 136/80 | HR 66 | Resp 14 | Ht 61.0 in | Wt 163.0 lb

## 2023-10-06 DIAGNOSIS — E559 Vitamin D deficiency, unspecified: Secondary | ICD-10-CM

## 2023-10-06 DIAGNOSIS — G894 Chronic pain syndrome: Secondary | ICD-10-CM | POA: Diagnosis not present

## 2023-10-06 DIAGNOSIS — M1612 Unilateral primary osteoarthritis, left hip: Secondary | ICD-10-CM | POA: Diagnosis not present

## 2023-10-06 DIAGNOSIS — G5603 Carpal tunnel syndrome, bilateral upper limbs: Secondary | ICD-10-CM | POA: Diagnosis not present

## 2023-10-06 DIAGNOSIS — M19071 Primary osteoarthritis, right ankle and foot: Secondary | ICD-10-CM | POA: Diagnosis not present

## 2023-10-06 DIAGNOSIS — M19072 Primary osteoarthritis, left ankle and foot: Secondary | ICD-10-CM

## 2023-10-06 DIAGNOSIS — M19041 Primary osteoarthritis, right hand: Secondary | ICD-10-CM

## 2023-10-06 DIAGNOSIS — M2241 Chondromalacia patellae, right knee: Secondary | ICD-10-CM

## 2023-10-06 DIAGNOSIS — M797 Fibromyalgia: Secondary | ICD-10-CM | POA: Diagnosis not present

## 2023-10-06 DIAGNOSIS — Z8739 Personal history of other diseases of the musculoskeletal system and connective tissue: Secondary | ICD-10-CM

## 2023-10-06 DIAGNOSIS — M1120 Other chondrocalcinosis, unspecified site: Secondary | ICD-10-CM

## 2023-10-06 DIAGNOSIS — M19042 Primary osteoarthritis, left hand: Secondary | ICD-10-CM

## 2023-10-06 DIAGNOSIS — Z79899 Other long term (current) drug therapy: Secondary | ICD-10-CM

## 2023-10-06 DIAGNOSIS — M0579 Rheumatoid arthritis with rheumatoid factor of multiple sites without organ or systems involvement: Secondary | ICD-10-CM

## 2023-10-06 DIAGNOSIS — M2242 Chondromalacia patellae, left knee: Secondary | ICD-10-CM

## 2023-10-06 DIAGNOSIS — R768 Other specified abnormal immunological findings in serum: Secondary | ICD-10-CM | POA: Diagnosis not present

## 2023-10-06 DIAGNOSIS — I10 Essential (primary) hypertension: Secondary | ICD-10-CM

## 2023-10-06 DIAGNOSIS — Z8639 Personal history of other endocrine, nutritional and metabolic disease: Secondary | ICD-10-CM

## 2023-10-06 DIAGNOSIS — R7303 Prediabetes: Secondary | ICD-10-CM

## 2023-10-06 DIAGNOSIS — Z8709 Personal history of other diseases of the respiratory system: Secondary | ICD-10-CM

## 2023-10-06 MED ORDER — HYDROXYCHLOROQUINE SULFATE 200 MG PO TABS: 200.0000 mg | ORAL_TABLET | Freq: Every day | ORAL | 0 refills | Status: AC

## 2023-10-07 ENCOUNTER — Ambulatory Visit: Payer: Self-pay | Admitting: Physician Assistant

## 2023-10-07 LAB — COMPREHENSIVE METABOLIC PANEL WITH GFR
AG Ratio: 1.8 (calc) (ref 1.0–2.5)
ALT: 22 U/L (ref 6–29)
AST: 23 U/L (ref 10–35)
Albumin: 4.3 g/dL (ref 3.6–5.1)
Alkaline phosphatase (APISO): 70 U/L (ref 37–153)
BUN: 14 mg/dL (ref 7–25)
CO2: 33 mmol/L — ABNORMAL HIGH (ref 20–32)
Calcium: 9.5 mg/dL (ref 8.6–10.4)
Chloride: 102 mmol/L (ref 98–110)
Creat: 0.72 mg/dL (ref 0.60–1.00)
Globulin: 2.4 g/dL (ref 1.9–3.7)
Glucose, Bld: 85 mg/dL (ref 65–99)
Potassium: 4.4 mmol/L (ref 3.5–5.3)
Sodium: 142 mmol/L (ref 135–146)
Total Bilirubin: 0.4 mg/dL (ref 0.2–1.2)
Total Protein: 6.7 g/dL (ref 6.1–8.1)
eGFR: 90 mL/min/1.73m2 (ref >=60)

## 2023-10-07 LAB — CBC WITH DIFFERENTIAL/PLATELET
Absolute Lymphocytes: 1055 {cells}/uL (ref 850–3900)
Absolute Monocytes: 360 {cells}/uL (ref 200–950)
Basophils Absolute: 31 {cells}/uL (ref 0–200)
Basophils Relative: 0.5 %
Eosinophils Absolute: 189 {cells}/uL (ref 15–500)
Eosinophils Relative: 3.1 %
HCT: 48.6 % — ABNORMAL HIGH (ref 35.0–45.0)
Hemoglobin: 15.5 g/dL (ref 11.7–15.5)
MCH: 27.3 pg (ref 27.0–33.0)
MCHC: 31.9 g/dL — ABNORMAL LOW (ref 32.0–36.0)
MCV: 85.6 fL (ref 80.0–100.0)
MPV: 11.7 fL (ref 7.5–12.5)
Monocytes Relative: 5.9 %
Neutro Abs: 4465 {cells}/uL (ref 1500–7800)
Neutrophils Relative %: 73.2 %
Platelets: 167 Thousand/uL (ref 140–400)
RBC: 5.68 Million/uL — ABNORMAL HIGH (ref 3.80–5.10)
RDW: 15.1 % — ABNORMAL HIGH (ref 11.0–15.0)
Total Lymphocyte: 17.3 %
WBC: 6.1 Thousand/uL (ref 3.8–10.8)

## 2023-10-07 NOTE — Progress Notes (Signed)
 CBC stable. CMP WNL.

## 2023-10-15 ENCOUNTER — Ambulatory Visit (HOSPITAL_COMMUNITY)
Admission: RE | Admit: 2023-10-15 | Discharge: 2023-10-15 | Disposition: A | Discharge status: Home / Self Care | Source: Ambulatory Visit | Attending: Pulmonary Disease | Admitting: Pulmonary Disease

## 2023-10-15 DIAGNOSIS — R918 Other nonspecific abnormal finding of lung field: Secondary | ICD-10-CM | POA: Diagnosis not present

## 2023-10-15 DIAGNOSIS — R911 Solitary pulmonary nodule: Secondary | ICD-10-CM | POA: Diagnosis not present

## 2023-10-15 DIAGNOSIS — J432 Centrilobular emphysema: Secondary | ICD-10-CM | POA: Diagnosis not present

## 2023-10-15 DIAGNOSIS — J189 Pneumonia, unspecified organism: Secondary | ICD-10-CM | POA: Insufficient documentation

## 2023-10-29 ENCOUNTER — Encounter

## 2023-10-29 ENCOUNTER — Ambulatory Visit: Admitting: Pulmonary Disease

## 2023-10-29 ENCOUNTER — Encounter: Payer: Self-pay | Admitting: Pulmonary Disease

## 2023-10-29 ENCOUNTER — Ambulatory Visit (INDEPENDENT_AMBULATORY_CARE_PROVIDER_SITE_OTHER): Admitting: Pulmonary Disease

## 2023-10-29 VITALS — BP 110/70 | HR 69 | Temp 97.6°F | Ht 61.0 in | Wt 172.2 lb

## 2023-10-29 DIAGNOSIS — J4489 Other specified chronic obstructive pulmonary disease: Secondary | ICD-10-CM

## 2023-10-29 DIAGNOSIS — Z87891 Personal history of nicotine dependence: Secondary | ICD-10-CM | POA: Diagnosis not present

## 2023-10-29 DIAGNOSIS — R911 Solitary pulmonary nodule: Secondary | ICD-10-CM | POA: Diagnosis not present

## 2023-10-29 DIAGNOSIS — J439 Emphysema, unspecified: Secondary | ICD-10-CM

## 2023-10-29 LAB — PULMONARY FUNCTION TEST
DL/VA % pred: 94 %
DL/VA: 4.03 ml/min/mmHg/L
DLCO unc % pred: 75 %
DLCO unc: 13.05 ml/min/mmHg
FEF 25-75 Post: 0.56 L/s
FEF 25-75 Pre: 0.68 L/s
FEF2575-%Change-Post: -18 %
FEF2575-%Pred-Post: 32 %
FEF2575-%Pred-Pre: 39 %
FEV1-%Change-Post: -4 %
FEV1-%Pred-Post: 53 %
FEV1-%Pred-Pre: 56 %
FEV1-Post: 1.07 L
FEV1-Pre: 1.12 L
FEV1FVC-%Change-Post: -10 %
FEV1FVC-%Pred-Pre: 83 %
FEV6-%Change-Post: 6 %
FEV6-%Pred-Post: 74 %
FEV6-%Pred-Pre: 69 %
FEV6-Post: 1.86 L
FEV6-Pre: 1.75 L
FEV6FVC-%Change-Post: 0 %
FEV6FVC-%Pred-Post: 104 %
FEV6FVC-%Pred-Pre: 104 %
FVC-%Change-Post: 6 %
FVC-%Pred-Post: 71 %
FVC-%Pred-Pre: 66 %
FVC-Post: 1.87 L
FVC-Pre: 1.75 L
Post FEV1/FVC ratio: 57 %
Post FEV6/FVC ratio: 99 %
Pre FEV1/FVC ratio: 64 %
Pre FEV6/FVC Ratio: 100 %
RV % pred: 123 %
RV: 2.51 L
TLC % pred: 103 %
TLC: 4.79 L

## 2023-10-29 MED ORDER — TRELEGY ELLIPTA 100-62.5-25 MCG/ACT IN AEPB: 1.0000 | INHALATION_SPRAY | Freq: Every day | RESPIRATORY_TRACT | 3 refills | Status: AC

## 2023-10-29 NOTE — Progress Notes (Signed)
 Synopsis: Referred in by Dawn Domino, MD   Subjective:   PATIENT ID: Dawn Hughes GENDER: female DOB: [birth year], MRN: [id number]  Chief Complaint  Patient presents with   Medical Management of Chronic Issues    PFT results No SOB or wheezing. She does cough when she takes her Albuterol .     HPI This is a case of a 68 year old female patient with a past medical history of anxiety, hypothyroidism, COPD, GERD, neuropathic pain, seropositive rheumatoid arthritis on Plaquenil  presenting pulmonary clinic for evaluation of pulmonary nodules.  She presented to the ED on 4/21 with complaints of fever and cough.  As part of the workup a chest x-ray was obtained which showed multifocal infiltrate most prominent in the left lung base which prompted his CT chest 04/21.  CTA chest 04/21 without signs of PE but did show infiltrates in the left upper lobe and left lower lobe ill-defined masslike area measuring 3 x 2 cm.  Currently still experiencing fatigue, no significant weight changes.  Appetite is okay on days and bad on other days.  She denies any hemoptysis, does report significant dyspnea specifically on exertion.  Family history -no family history of lung cancer.  Social history  -previous smoker quit in 2011 smoked 1 pack/day since her teens.  OV 10/29/2023 - Dawn Hughes is here to follow up on her PFTs result that are consistent with Stage II COPD gp A. She is doing well overall and is using Advair, we discussed switching to Trelegy and she is agreeable. We also discussed her CT chest improvements, she still has Lingular tree in bud infiltration that are likely consistent with atypical infection. I will enroll her in the lung cancer screening program. Finally we discussed enrollment in pulmonary rehab and she is agreeable.   ROS All systems were reviewed and are negative except for the above Objective:   Vitals:   10/29/23 1039  BP: 110/70  Pulse: 69  Temp: 97.6 F  (36.4 C)  SpO2: 94%  Weight: 172 lb 3.2 oz (78.1 kg)  Height: 5' 1 (1.549 m)   94% on  RA BMI Readings from Last 3 Encounters:  10/29/23 32.54 kg/m  10/29/23 32.54 kg/m  10/06/23 30.80 kg/m   Wt Readings from Last 3 Encounters:  10/29/23 172 lb 3.2 oz (78.1 kg)  10/29/23 172 lb 3.2 oz (78.1 kg)  10/06/23 163 lb (73.9 kg)    Physical Exam GEN: NAD, Healthy Appearing HEENT: Supple Neck, Reactive Pupils, EOMI  CVS: Normal S1, Normal S2, RRR, No murmurs or ES appreciated  Lungs: Diminished air entry over the left hemithorax Abdomen: Soft, non tender, non distended, + BS  Extremities: Warm and well perfused, No edema  Skin: No suspicious lesions appreciated  Psych: Normal Affect  Ancillary Information   CBC    Component Value Date/Time   WBC 6.1 10/06/2023 1345   RBC 5.68 (H) 10/06/2023 1345   HGB 15.5 10/06/2023 1345   HCT 48.6 (H) 10/06/2023 1345   PLT 167 10/06/2023 1345   MCV 85.6 10/06/2023 1345   MCH 27.3 10/06/2023 1345   MCHC 31.9 (L) 10/06/2023 1345   RDW 15.1 (H) 10/06/2023 1345   LYMPHSABS 1,040 11/13/2022 1021   MONOABS 0.5 05/09/2022 1138   EOSABS 189 10/06/2023 1345   BASOSABS 31 10/06/2023 1345  Labs and imaging were reviewed.    Latest Ref Rng & Units 10/29/2023    9:42 AM  PFT Results  FVC-Pre L 1.75  P  FVC-Predicted Pre %  66  P  FVC-Post L 1.87  P  FVC-Predicted Post % 71  P  Pre FEV1/FVC % % 64  P  Post FEV1/FCV % % 57  P  FEV1-Pre L 1.12  P  FEV1-Predicted Pre % 56  P  FEV1-Post L 1.07  P  DLCO uncorrected ml/min/mmHg 13.05  P  DLCO [facility]% % 75  P  DLVA Predicted % 94  P  TLC L 4.79  P  TLC % Predicted % 103  P  RV % Predicted % 123  P    P Preliminary result     Assessment & Plan:  This is a case of a 68 year old female patient with a past medical history of anxiety, hypothyroidism, COPD, GERD, neuropathic pain, seropositive rheumatoid arthritis on Plaquenil  presenting pulmonary clinic for evaluation of pulmonary nodules.  #  LLL lobe and lingula tree in bud pattern consistent with atypical infection.   []  Repeat CT chest wo contrast in 6 months.   #Tobacco use disorder  previous smoker quit in 2011 smoked 1 pack/day since her teens. Currently vapes.   []  Enroll in LDCT program.   #COPD Stage II gp A  []  D/c Advair and start Trelegy Ellipta  100 1 puff daily.  []  c/w Albuterol  as needed.  []  Enroll in pulmonary rehab.   RTC 6 months  I spent 30 minutes caring for this patient today, including preparing to see the patient, obtaining a medical history , reviewing a separately obtained history, performing a medically appropriate examination and/or evaluation, counseling and educating the patient/family/caregiver, ordering medications, tests, or procedures, documenting clinical information in the electronic health record, and independently interpreting results (not separately reported/billed) and communicating results to the patient/family/caregiver  Darrin Barn, MD [facility] 10/29/2023 10:52 AM

## 2023-10-29 NOTE — Progress Notes (Signed)
 Full PFT completed today ? ?

## 2023-10-29 NOTE — Patient Instructions (Signed)
 Full PFT completed today ? ?

## 2023-11-04 DIAGNOSIS — G894 Chronic pain syndrome: Secondary | ICD-10-CM | POA: Diagnosis not present

## 2023-11-04 DIAGNOSIS — M4726 Other spondylosis with radiculopathy, lumbar region: Secondary | ICD-10-CM | POA: Diagnosis not present

## 2023-11-04 DIAGNOSIS — M4722 Other spondylosis with radiculopathy, cervical region: Secondary | ICD-10-CM | POA: Diagnosis not present

## 2023-11-04 DIAGNOSIS — G5603 Carpal tunnel syndrome, bilateral upper limbs: Secondary | ICD-10-CM | POA: Diagnosis not present

## 2023-11-11 ENCOUNTER — Encounter (HOSPITAL_COMMUNITY): Payer: Self-pay

## 2023-11-16 ENCOUNTER — Encounter (HOSPITAL_COMMUNITY)
Admission: RE | Admit: 2023-11-16 | Discharge: 2023-11-16 | Disposition: A | Discharge status: Home / Self Care | Source: Ambulatory Visit | Attending: Pulmonary Disease | Admitting: Pulmonary Disease

## 2023-11-16 DIAGNOSIS — J449 Chronic obstructive pulmonary disease, unspecified: Secondary | ICD-10-CM | POA: Insufficient documentation

## 2023-11-16 NOTE — Progress Notes (Signed)
 Completed virtual orientation today.  EP evaluation is scheduled for 9/24 at 0830 .  Documentation for diagnosis can be found in [facility] encounter 10/29/23.

## 2023-11-18 ENCOUNTER — Encounter (HOSPITAL_COMMUNITY)
Admission: RE | Admit: 2023-11-18 | Discharge: 2023-11-18 | Disposition: A | Discharge status: Home / Self Care | Source: Ambulatory Visit | Attending: Pulmonary Disease | Admitting: Pulmonary Disease

## 2023-11-18 VITALS — Ht 60.0 in | Wt 172.8 lb

## 2023-11-18 DIAGNOSIS — J449 Chronic obstructive pulmonary disease, unspecified: Secondary | ICD-10-CM

## 2023-11-18 NOTE — Progress Notes (Signed)
 Pulmonary Individual Treatment Plan  Patient Details  Name: Dawn Hughes MRN: [id number] Date of Birth: May 14, 1955 Referring Provider:   Flowsheet Row PULMONARY REHAB COPD ORIENTATION from 11/18/2023 in [facility] CARDIAC REHABILITATION  Referring Provider Assaker, Darrin MD    Initial Encounter Date:  Flowsheet Row PULMONARY REHAB COPD ORIENTATION from 11/18/2023 in [facility]ARDIAC REHABIL[facility]ATION  Date 11/18/23    Visit Diagnosis: Stage 2 moderate COPD by GOLD classification (HCC)  Patient's Home Medications on Admission:   Current Outpatient Medications:    albuterol  (VENTOLIN  HFA) 108 (90 Base) MCG/ACT inhaler, Inhale into the lungs every 6 (six) hours as needed for wheezing or shortness of breath., Disp: , Rfl:    alendronate (FOSAMAX) 70 MG tablet, Take 70 mg by mouth once a week., Disp: , Rfl:    Cyanocobalamin (VITAMIN B12) 1000 MCG TBCR, 1 tablet Orally Once a day; Duration: 30 day(s) (Patient not taking: Reported on 11/16/2023), Disp: , Rfl:    D3-50 1.25 MG (50000 UT) capsule, Take 50,000 Units by mouth once a week., Disp: , Rfl:    DULoxetine  (CYMBALTA ) 60 MG capsule, Take 60 mg by mouth 2 (two) times daily., Disp: , Rfl:    fluticasone-salmeterol (ADVAIR) 250-50 MCG/ACT AEPB, 1 puff 2 (two) times daily as needed., Disp: , Rfl:    Fluticasone-Umeclidin-Vilant (TRELEGY ELLIPTA ) 100-62.5-25 MCG/ACT AEPB, Inhale 1 puff into the lungs daily., Disp: 3 each, Rfl: 3   hydroxychloroquine  (PLAQUENIL ) 200 MG tablet, Take 1 tablet (200 mg total) by mouth daily., Disp: 90 tablet, Rfl: 0   levothyroxine  (SYNTHROID ) 100 MCG tablet, Take 100 mcg by mouth daily., Disp: , Rfl:    metolazone (ZAROXOLYN) 5 MG tablet, TAKE 1 TABLET BY MOUTH EVERY DAY; Duration: 90, Disp: , Rfl:    metoprolol succinate (TOPROL-XL) 25 MG 24 hr tablet, 1 tablet Orally Once a day; Duration: 90 days, Disp: , Rfl:    morphine  (MS CONTIN ) 30 MG 12 hr tablet, Take 30 mg by mouth every 12 (twelve) hours., Disp:  , Rfl:    Multiple Vitamin (MULTIVITAMIN PO), Take by mouth., Disp: , Rfl:    Omega 3 1000 MG CAPS, 1 capsule., Disp: , Rfl:    pantoprazole  (PROTONIX ) 40 MG tablet, Take 40 mg by mouth daily., Disp: , Rfl:    pregabalin  (LYRICA ) 300 MG capsule, Take 300 mg by mouth 2 (two) times daily., Disp: , Rfl:    tiZANidine  (ZANAFLEX ) 4 MG tablet, Take 4 mg by mouth 3 (three) times daily as needed., Disp: , Rfl:   Past Medical History: Past Medical History:  Diagnosis Date   COPD (chronic obstructive pulmonary disease) (HCC)    Fibromyalgia    GAD (generalized anxiety disorder)    GERD (gastroesophageal reflux disease)    Hypertension    Hypothyroidism    Lung nodules    Other intervertebral disc degeneration, lumbar region    Sleep apnea     Tobacco Use: Social History   Tobacco Use  Smoking Status Former   Current packs/day: 0.00   Average packs/day: 1 pack/day for 52.0 years (52.0 ttl pk-yrs)   Types: Cigarettes   Start date: 68   Quit date: 2021   Years since quitting: 4.7   Passive exposure: Never  Smokeless Tobacco Never    Labs: Review Flowsheet        No data to display          Capillary Blood Glucose: No results found for: GLUCAP   Pulmonary Assessment Scores:  Pulmonary  Assessment Scores     Row Name 11/16/23 0957         ADL UCSD   ADL Phase Entry     SOB Score total 42     Rest 2     Walk 3     Stairs 5     Bath 3     Dress 1     Shop 1       CAT Score   CAT Score 17       UCSD: Self-administered rating of dyspnea associated with activities of daily living (ADLs) 6-point scale (0 = not at all to 5 = maximal or unable to do because of breathlessness)  Scoring Scores range from 0 to 120.  Minimally important difference is 5 units  CAT: CAT can identify the health impairment of COPD patients and is better correlated with disease progression.  CAT has a scoring range of zero to 40. The CAT score is classified into four groups of low  (less than 10), medium (10 - 20), high (21-30) and very high (31-40) based on the impact level of disease on health status. A CAT score over 10 suggests significant symptoms.  A worsening CAT score could be explained by an exacerbation, poor medication adherence, poor inhaler technique, or progression of COPD or comorbid conditions.  CAT MCID is 2 points  mMRC: mMRC (Modified Medical Research Council) Dyspnea Scale is used to assess the degree of baseline functional disability in patients of respiratory disease due to dyspnea. No minimal important difference is established. A decrease in score of 1 point or greater is considered a positive change.   Pulmonary Function Assessment:  Pulmonary Function Assessment - 11/16/23 0949       Pulmonary Function Tests   FVC% 66 %    FEV1% 56 %    FEV1/FVC Ratio 64    RV% 123 %    DLCO% 75 %          Exercise Target Goals: Exercise Program Goal: Individual exercise prescription set using results from initial 6 min walk test and THRR while considering  patient's activity barriers and safety.   Exercise Prescription Goal: Initial exercise prescription builds to 30-45 minutes a day of aerobic activity, 2-3 days per week.  Home exercise guidelines will be given to patient during program as part of exercise prescription that the participant will acknowledge.  Activity Barriers & Risk Stratification:  Activity Barriers & Cardiac Risk Stratification - 11/16/23 0943       Activity Barriers & Cardiac Risk Stratification   Activity Barriers Fibromyalgia;History of Falls;Deconditioning;Muscular Weakness;Shortness of Breath          6 Minute Walk:  6 Minute Walk     Row Name 11/18/23 1011         6 Minute Walk   Phase Initial     Distance 1105 feet     Walk Time 6 minutes     # of Rest Breaks 0     MPH 2.09     METS 2.07     RPE 15     Perceived Dyspnea  4     VO2 Peak 7.24     Symptoms Yes (comment)     Comments SOB, back  pain/weakness 8/10     Resting HR 70 bpm     Resting BP 100/60     Resting Oxygen Saturation  92 %     Exercise Oxygen Saturation  during 6 min walk 88 %  Max Ex. HR 83 bpm     Max Ex. BP 148/74     2 Minute Post BP 124/66       Interval HR   1 Minute HR 78     2 Minute HR 83     3 Minute HR 82     4 Minute HR 83     5 Minute HR 83     6 Minute HR 80     2 Minute Post HR 66     Interval Heart Rate? Yes       Interval Oxygen   Interval Oxygen? Yes     Baseline Oxygen Saturation % 92 %     1 Minute Oxygen Saturation % 92 %     1 Minute Liters of Oxygen 0 L  Room Air     2 Minute Oxygen Saturation % 91 %     2 Minute Liters of Oxygen 0 L     3 Minute Oxygen Saturation % 92 %     3 Minute Liters of Oxygen 0 L     4 Minute Oxygen Saturation % 92 %     4 Minute Liters of Oxygen 0 L     5 Minute Oxygen Saturation % 92 %     5 Minute Liters of Oxygen 0 L     6 Minute Oxygen Saturation % 88 %     6 Minute Liters of Oxygen 0 L     2 Minute Post Oxygen Saturation % 95 %     2 Minute Post Liters of Oxygen 0 L        Oxygen Initial Assessment:  Oxygen Initial Assessment - 11/16/23 0945       Home Oxygen   Home Oxygen Device None    Sleep Oxygen Prescription None    Home Exercise Oxygen Prescription None    Home Resting Oxygen Prescription None      Intervention   Short Term Goals To learn and understand importance of monitoring SPO2 with pulse oximeter and demonstrate accurate use of the pulse oximeter.;To learn and understand importance of maintaining oxygen saturations>88%;To learn and demonstrate proper pursed lip breathing techniques or other breathing techniques. ;To learn and demonstrate proper use of respiratory medications    Long  Term Goals Verbalizes importance of monitoring SPO2 with pulse oximeter and return demonstration;Maintenance of O2 saturations>88%;Exhibits proper breathing techniques, such as pursed lip breathing or other method taught during program  session;Compliance with respiratory medication;Demonstrates proper use of MDI's          Oxygen Re-Evaluation:   Oxygen Discharge (Final Oxygen Re-Evaluation):   Initial Exercise Prescription:  Initial Exercise Prescription - 11/18/23 1000       Date of Initial Exercise RX and Referring Provider   Date 11/18/23    Referring Provider Assaker, Darrin MD      Oxygen   Maintain Oxygen Saturation 88% or higher      NuStep   Level 1    SPM 80    Minutes 15    METs 2      Track   Laps 15    Minutes 15    METs 2      Prescription Details   Frequency (times per week) 2    Duration Progress to 30 minutes of continuous aerobic without signs/symptoms of physical distress      Intensity   THRR 40-80% of Max Heartrate 102-134    Ratings of Perceived Exertion 11-13  Perceived Dyspnea 0-4      Progression   Progression Continue to progress workloads to maintain intensity without signs/symptoms of physical distress.      Resistance Training   Training Prescription Yes    Weight 3/2 lb    Reps 10-15          Perform Capillary Blood Glucose checks as needed.  Exercise Prescription Changes:   Exercise Prescription Changes     Row Name 11/18/23 1000             Response to Exercise   Blood Pressure (Admit) 100/60       Blood Pressure (Exercise) 148/74       Blood Pressure (Exit) 124/66       Heart Rate (Admit) 70 bpm       Heart Rate (Exercise) 83 bpm       Heart Rate (Exit) 65 bpm       Oxygen Saturation (Admit) 92 %       Oxygen Saturation (Exercise) 88 %       Oxygen Saturation (Exit) 94 %       Rating of Perceived Exertion (Exercise) 15       Perceived Dyspnea (Exercise) 4       Symptoms SOB, back pain/weakness (8/10)       Comments walk test results          Exercise Comments:   Exercise Comments     Row Name 11/16/23 0950           Exercise Comments 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           Exercise Goals and Review:   Exercise Goals     Row Name 11/16/23 0944             Exercise Goals   Increase Physical Activity Yes       Intervention Provide advice, education, support and counseling about physical activity/exercise needs.;Develop an individualized exercise prescription for aerobic and resistive training based on initial evaluation findings, risk stratification, comorbidities and participant's personal goals.       Expected Outcomes Short Term: Attend rehab on a regular basis to increase amount of physical activity.;Long Term: Add in home exercise to make exercise part of routine and to increase amount of physical activity.;Long Term: Exercising regularly at least 3-5 days a week.       Increase Strength and Stamina Yes       Intervention Provide advice, education, support and counseling about physical activity/exercise needs.;Develop an individualized exercise prescription for aerobic and resistive training based on initial evaluation findings, risk stratification, comorbidities and participant's personal goals.       Expected Outcomes Short Term: Increase workloads from initial exercise prescription for resistance, speed, and METs.;Short Term: Perform resistance training exercises routinely during rehab and add in resistance training at home;Long Term: Improve cardiorespiratory fitness, muscular endurance and strength as measured by increased METs and functional capacity ( )       Able to understand and use rate of perceived exertion (RPE) scale Yes       Intervention Provide education and explanation on how to use RPE scale       Expected Outcomes Short Term: Able to use RPE daily in rehab to express subjective intensity level;Long Term:  Able to use RPE to guide intensity level when exercising independently       Able to understand and use Dyspnea scale  Yes       Intervention Provide education and explanation on how to use Dyspnea scale       Expected Outcomes  Short Term: Able to use Dyspnea scale daily in rehab to express subjective sense of shortness of breath during exertion;Long Term: Able to use Dyspnea scale to guide intensity level when exercising independently       Knowledge and understanding of Target Heart Rate Range (THRR) Yes       Intervention Provide education and explanation of THRR including how the numbers were predicted and where they are located for reference       Expected Outcomes Short Term: Able to state/look up THRR;Short Term: Able to use daily as guideline for intensity in rehab;Long Term: Able to use THRR to govern intensity when exercising independently       Able to check pulse independently Yes       Intervention Provide education and demonstration on how to check pulse in carotid and radial arteries.;Review the importance of being able to check your own pulse for safety during independent exercise       Expected Outcomes Short Term: Able to explain why pulse checking is important during independent exercise;Long Term: Able to check pulse independently and accurately       Understanding of Exercise Prescription Yes       Intervention Provide education, explanation, and written materials on patient's individual exercise prescription       Expected Outcomes Long Term: Able to explain home exercise prescription to exercise independently;Short Term: Able to explain program exercise prescription          Exercise Goals Re-Evaluation :   Discharge Exercise Prescription (Final Exercise Prescription Changes):  Exercise Prescription Changes - 11/18/23 1000       Response to Exercise   Blood Pressure (Admit) 100/60    Blood Pressure (Exercise) 148/74    Blood Pressure (Exit) 124/66    Heart Rate (Admit) 70 bpm    Heart Rate (Exercise) 83 bpm    Heart Rate (Exit) 65 bpm    Oxygen Saturation (Admit) 92 %    Oxygen Saturation (Exercise) 88 %    Oxygen Saturation (Exit) 94 %    Rating of Perceived Exertion (Exercise) 15     Perceived Dyspnea (Exercise) 4    Symptoms SOB, back pain/weakness (8/10)    Comments walk test results          Nutrition:  Target Goals: Understanding of nutrition guidelines, daily intake of sodium 1500mg , cholesterol 200mg , calories 30% from fat and 7% or less from saturated fats, daily to have 5 or more servings of fruits and vegetables.  Biometrics:  Pre Biometrics - 11/18/23 1017       Pre Biometrics   Height 5' (1.524 m)    Weight 78.4 kg    Waist Circumference 38 inches    Hip Circumference 44 inches    Waist to Hip Ratio 0.86 %    BMI (Calculated) 33.75    Grip Strength 19 kg    Single Leg Stand 1.9 seconds           Nutrition Therapy Plan and Nutrition Goals:  Nutrition Therapy & Goals - 11/16/23 0950       Intervention Plan   Intervention Prescribe, educate and counsel regarding individualized specific dietary modifications aiming towards targeted core components such as weight, hypertension, lipid management, diabetes, heart failure and other comorbidities.;Nutrition handout(s) given to patient.    Expected Outcomes Short Term Goal:  Understand basic principles of dietary content, such as calories, fat, sodium, cholesterol and nutrients.;Short Term Goal: A plan has been developed with personal nutrition goals set during dietitian appointment.;Long Term Goal: Adherence to prescribed nutrition plan.          Nutrition Assessments:  Nutrition Assessments - 11/16/23 0959       Rate Your Plate Scores   Pre Score 47         MEDIFICTS Score Key: >=70 Need to make dietary changes  40-70 Heart Healthy Diet <= 40 Therapeutic Level Cholesterol Diet  Flowsheet Row PULMONARY VIRTUAL BASED CARE from 11/16/2023 in [facility] CARDIAC REHABILITATION  Picture Your Plate Total Score on Admission 47   Picture Your Plate Scores: <59 Unhealthy dietary pattern with much room for improvement. 41-50 Dietary pattern unlikely to meet recommendations for good health and  room for improvement. 51-60 More healthful dietary pattern, with some room for improvement.  >60 Healthy dietary pattern, although there may be some specific behaviors that could be improved.    Nutrition Goals Re-Evaluation:   Nutrition Goals Discharge (Final Nutrition Goals Re-Evaluation):   Psychosocial: Target Goals: Acknowledge presence or absence of significant depression and/or stress, maximize coping skills, provide positive support system. Participant is able to verbalize types and ability to use techniques and skills needed for reducing stress and depression.  Initial Review & Psychosocial Screening:  Initial Psych Review & Screening - 11/16/23 0950       Initial Review   Current issues with Current Stress Concerns;Current Psychotropic Meds    Source of Stress Concerns Chronic Illness;Occupation    Comments Treana states she is stressed a lot of the time, sometimes she feels like she makes her own stress. Has a history of anxiety.      Family Dynamics   Good Support System? Yes    Comments Melayah lives alone with her dog, she says if she had an emergency to call her son who is on her contact list.      Barriers   Psychosocial barriers to participate in program There are no identifiable barriers or psychosocial needs.      Screening Interventions   Interventions Encouraged to exercise    Expected Outcomes Short Term goal: Utilizing psychosocial counselor, staff and physician to assist with identification of specific Stressors or current issues interfering with healing process. Setting desired goal for each stressor or current issue identified.;Long Term Goal: Stressors or current issues are controlled or eliminated.;Short Term goal: Identification and review with participant of any Quality of Life or Depression concerns found by scoring the questionnaire.;Long Term goal: The participant improves quality of Life and PHQ9 Scores as seen by post scores and/or verbalization of  changes          Quality of Life Scores:  Scores of 19 and below usually indicate a poorer quality of life in these areas.  A difference of  2-3 points is a clinically meaningful difference.  A difference of 2-3 points in the total score of the Quality of Life Index has been associated with significant improvement in overall quality of life, self-image, physical symptoms, and general health in studies assessing change in quality of life.  PHQ-9: Review Flowsheet       11/18/2023 06/18/2020  Depression screen PHQ 2/9  Decreased Interest 1 0  Down, Depressed, Hopeless 1 0  PHQ - 2 Score 2 0  Altered sleeping 1 0  Tired, decreased energy 1 3  Change in appetite 1 0  Feeling  bad or failure about yourself  1 0  Trouble concentrating 0 0  Moving slowly or fidgety/restless 0 0  Suicidal thoughts 0 0  PHQ-9 Score 6 3   Interpretation of Total Score  Total Score Depression Severity:  1-4 = Minimal depression, 5-9 = Mild depression, 10-14 = Moderate depression, 15-19 = Moderately severe depression, 20-27 = Severe depression   Psychosocial Evaluation and Intervention:  Psychosocial Evaluation - 11/16/23 0952       Psychosocial Evaluation & Interventions   Interventions Stress management education;Relaxation education;Encouraged to exercise with the program and follow exercise prescription    Comments Kinzee is a pleasant 68 year old female who is coming into rehab for her COPD. She lives home alone with her dog. 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 She also walks some at home. She states she gets around ok, is a little wobbly at times, uses no assistive device. She has had a fall in the last year where she fell into her rose bushes at home. She wears no oxygen, but is supposed to wear a CPAP at night but when she moved here from [place]  she lost it and didn't feel like getting another one.    Expected Outcomes Short: Increase strength and stamina.  Long: Improve shortness of breath.    Continue Psychosocial Services  Follow up required by staff          Psychosocial Re-Evaluation:   Psychosocial Discharge (Final Psychosocial Re-Evaluation):   Education: Education Goals: Education classes will be provided on a weekly basis, covering required topics. Participant will state understanding/return demonstration of topics presented.  Learning Barriers/Preferences:  Learning Barriers/Preferences - 11/16/23 0945       Learning Barriers/Preferences   Learning Barriers Sight   wears glasses   Learning Preferences None          Education Topics: Know Your Numbers Group instruction that is supported by a PowerPoint presentation. Instructor discusses importance of knowing and understanding resting, exercise, and post-exercise oxygen saturation, heart rate, and blood pressure. Oxygen saturation, heart rate, blood pressure, rating of perceived exertion, and dyspnea are reviewed along with a normal range for these values.    Exercise for the Pulmonary Patient Group instruction that is supported by a PowerPoint presentation. Instructor discusses benefits of exercise, core components of exercise, frequency, duration, and intensity of an exercise routine, importance of utilizing pulse oximetry during exercise, safety while exercising, and options of places to exercise outside of rehab.    MET Level  Group instruction provided by PowerPoint, verbal discussion, and written material to support subject matter. Instructor reviews what METs are and how to increase METs.    Pulmonary Medications Verbally interactive group education provided by instructor with focus on inhaled medications and proper administration.   Anatomy and Physiology of the Respiratory System Group instruction provided by PowerPoint, verbal discussion, and written material to support subject matter. Instructor reviews respiratory cycle and anatomical components of the  respiratory system and their functions. Instructor also reviews differences in obstructive and restrictive respiratory diseases with examples of each.    Oxygen Safety Group instruction provided by PowerPoint, verbal discussion, and written material to support subject matter. There is an overview of "What is Oxygen" and "Why do we need it".  Instructor also reviews how to create a safe environment for oxygen use, the importance of using oxygen as prescribed, and the risks of noncompliance. There is a brief discussion on traveling with oxygen  and resources the patient may utilize.   Oxygen Use Group instruction provided by PowerPoint, verbal discussion, and written material to discuss how supplemental oxygen is prescribed and different types of oxygen supply systems. Resources for more information are provided.    Breathing Techniques Group instruction that is supported by demonstration and informational handouts. Instructor discusses the benefits of pursed lip and diaphragmatic breathing and detailed demonstration on how to perform both.     Risk Factor Reduction Group instruction that is supported by a PowerPoint presentation. Instructor discusses the definition of a risk factor, different risk factors for pulmonary disease, and how the heart and lungs work together.   Pulmonary Diseases Group instruction provided by PowerPoint, verbal discussion, and written material to support subject matter. Instructor gives an overview of the different type of pulmonary diseases. There is also a discussion on risk factors and symptoms as well as ways to manage the diseases.   Stress and Energy Conservation Group instruction provided by PowerPoint, verbal discussion, and written material to support subject matter. Instructor gives an overview of stress and the impact it can have on the body. Instructor also reviews ways to reduce stress. There is also a discussion on energy conservation and ways to  conserve energy throughout the day.   Warning Signs and Symptoms Group instruction provided by PowerPoint, verbal discussion, and written material to support subject matter. Instructor reviews warning signs and symptoms of stroke, heart attack, cold and flu. Instructor also reviews ways to prevent the spread of infection.   Other Education Group or individual verbal, written, or video instructions that support the educational goals of the pulmonary rehab program.    Knowledge Questionnaire Score:  Knowledge Questionnaire Score - 11/16/23 0957       Knowledge Questionnaire Score   Pre Score 14/18          Core Components/Risk Factors/Patient Goals at Admission:  Personal Goals and Risk Factors at Admission - 11/18/23 1017       Core Components/Risk Factors/Patient Goals on Admission    Weight Management Yes;Obesity;Weight Loss    Intervention Weight Management: Develop a combined nutrition and exercise program designed to reach desired caloric intake, while maintaining appropriate intake of nutrient and fiber, sodium and fats, and appropriate energy expenditure required for the weight goal.;Weight Management: Provide education and appropriate resources to help participant work on and attain dietary goals.;Weight Management/Obesity: Establish reasonable short term and long term weight goals.;Obesity: Provide education and appropriate resources to help participant work on and attain dietary goals.    Admit Weight 172 lb 12.8 oz (78.4 kg)    Goal Weight: Short Term 170 lb (77.1 kg)    Goal Weight: Long Term 165 lb (74.8 kg)    Expected Outcomes Short Term: Continue to assess and modify interventions until short term weight is achieved;Long Term: Adherence to nutrition and physical activity/exercise program aimed toward attainment of established weight goal;Weight Loss: Understanding of general recommendations for a balanced deficit meal plan, which promotes 1-2 lb weight loss per week and  includes a negative energy balance of [phone] kcal/d;Understanding recommendations for meals to include 15-35% energy as protein, 25-35% energy from fat, 35-60% energy from carbohydrates, less than 200mg  of dietary cholesterol, 20-35 gm of total fiber daily;Understanding of distribution of calorie intake throughout the day with the consumption of 4-5 meals/snacks    Improve shortness of breath with ADL's Yes    Intervention Provide education, individualized exercise plan and daily activity instruction to help decrease symptoms of SOB  with activities of daily living.    Expected Outcomes Short Term: Improve cardiorespiratory fitness to achieve a reduction of symptoms when performing ADLs;Long Term: Be able to perform more ADLs without symptoms or delay the onset of symptoms    Increase knowledge of respiratory medications and ability to use respiratory devices properly  Yes    Intervention Provide education and demonstration as needed of appropriate use of medications, inhalers, and oxygen therapy.    Expected Outcomes Short Term: Achieves understanding of medications use. Understands that oxygen is a medication prescribed by physician. Demonstrates appropriate use of inhaler and oxygen therapy.;Long Term: Maintain appropriate use of medications, inhalers, and oxygen therapy.    Stress Yes    Intervention Offer individual and/or small group education and counseling on adjustment to heart disease, stress management and health-related lifestyle change. Teach and support self-help strategies.;Refer participants experiencing significant psychosocial distress to appropriate mental health specialists for further evaluation and treatment. When possible, include family members and significant others in education/counseling sessions.    Expected Outcomes Short Term: Participant demonstrates changes in health-related behavior, relaxation and other stress management skills, ability to obtain effective social support,  and compliance with psychotropic medications if prescribed.;Long Term: Emotional wellbeing is indicated by absence of clinically significant psychosocial distress or social isolation.          Core Components/Risk Factors/Patient Goals Review:    Core Components/Risk Factors/Patient Goals at Discharge (Final Review):    ITP Comments:  ITP Comments     Row Name 11/16/23 0949 11/18/23 0930         ITP Comments Completed virtual orientation today.  EP evaluation is scheduled for 9/24 at 0830 .  Documentation for diagnosis can be found in [facility] encounter 10/29/23. Patient arrived for 1st visit/orientation/education at 0830. Patient was referred to PR by Dr. Malka due to COPD. During orientation advised patient on arrival and appointment times what to wear, what to do before, during and after exercise. Reviewed attendance and class policy.  Pt is scheduled to return Pulmonary Rehab on 11/23/23 at 745. Pt was advised to come to class 15 minutes before class starts.  Discussed RPE/Dpysnea scales. Patient participated in warm up stretches. Patient was able to complete 6 minute walk test. Patient was measured for the equipment. Discussed equipment safety with patient. Took patient pre-anthropometric measurements. Patient finished visit at 945.         Comments: Patient arrived for 1st visit/orientation/education at 0830. Patient was referred to PR by Dr. Malka due to COPD. During orientation advised patient on arrival and appointment times what to wear, what to do before, during and after exercise. Reviewed attendance and class policy.  Pt is scheduled to return Pulmonary Rehab on 11/23/23 at 745. Pt was advised to come to class 15 minutes before class starts.  Discussed RPE/Dpysnea scales. Patient participated in warm up stretches. Patient was able to complete 6 minute walk test. Patient was measured for the equipment. Discussed equipment safety with patient. Took patient pre-anthropometric  measurements. Patient finished visit at 945.

## 2023-11-18 NOTE — Patient Instructions (Signed)
 Patient Instructions  Patient Details  Name: Dawn Hughes MRN: [id number] Date of Birth: Jan 21, 1956 Referring Provider:  Malka Domino, MD  Below are your personal goals for exercise, nutrition, and risk factors. Our goal is to help you stay on track towards obtaining and maintaining these goals. We will be discussing your progress on these goals with you throughout the program.  Initial Exercise Prescription:  Initial Exercise Prescription - 11/18/23 1000       Date of Initial Exercise RX and Referring Provider   Date 11/18/23    Referring Provider Assaker, Domino MD      Oxygen   Maintain Oxygen Saturation 88% or higher      NuStep   Level 1    SPM 80    Minutes 15    METs 2      Track   Laps 15    Minutes 15    METs 2      Prescription Details   Frequency (times per week) 2    Duration Progress to 30 minutes of continuous aerobic without signs/symptoms of physical distress      Intensity   THRR 40-80% of Max Heartrate 102-134    Ratings of Perceived Exertion 11-13    Perceived Dyspnea 0-4      Progression   Progression Continue to progress workloads to maintain intensity without signs/symptoms of physical distress.      Resistance Training   Training Prescription Yes    Weight 3/2 lb    Reps 10-15          Exercise Goals: Frequency: Be able to perform aerobic exercise two to three times per week in program working toward 2-5 days per week of home exercise.  Intensity: Work with a perceived exertion of 11 (fairly light) - 15 (hard) while following your exercise prescription.  We will make changes to your prescription with you as you progress through the program.   Duration: Be able to do 30 to 45 minutes of continuous aerobic exercise in addition to a 5 minute warm-up and a 5 minute cool-down routine.   Nutrition Goals: Your personal nutrition goals will be established when you do your nutrition analysis with the dietician.  The following  are general nutrition guidelines to follow: Cholesterol < 200mg /day Sodium < 1500mg /day Fiber: Women over 50 yrs - 21 grams per day  Personal Goals:  Personal Goals and Risk Factors at Admission - 11/18/23 1017       Core Components/Risk Factors/Patient Goals on Admission    Weight Management Yes;Obesity;Weight Loss    Intervention Weight Management: Develop a combined nutrition and exercise program designed to reach desired caloric intake, while maintaining appropriate intake of nutrient and fiber, sodium and fats, and appropriate energy expenditure required for the weight goal.;Weight Management: Provide education and appropriate resources to help participant work on and attain dietary goals.;Weight Management/Obesity: Establish reasonable short term and long term weight goals.;Obesity: Provide education and appropriate resources to help participant work on and attain dietary goals.    Admit Weight 172 lb 12.8 oz (78.4 kg)    Goal Weight: Short Term 170 lb (77.1 kg)    Goal Weight: Long Term 165 lb (74.8 kg)    Expected Outcomes Short Term: Continue to assess and modify interventions until short term weight is achieved;Long Term: Adherence to nutrition and physical activity/exercise program aimed toward attainment of established weight goal;Weight Loss: Understanding of general recommendations for a balanced deficit meal plan, which promotes 1-2 lb weight loss  per week and includes a negative energy balance of [phone] kcal/d;Understanding recommendations for meals to include 15-35% energy as protein, 25-35% energy from fat, 35-60% energy from carbohydrates, less than 200mg  of dietary cholesterol, 20-35 gm of total fiber daily;Understanding of distribution of calorie intake throughout the day with the consumption of 4-5 meals/snacks    Improve shortness of breath with ADL's Yes    Intervention Provide education, individualized exercise plan and daily activity instruction to help decrease symptoms  of SOB with activities of daily living.    Expected Outcomes Short Term: Improve cardiorespiratory fitness to achieve a reduction of symptoms when performing ADLs;Long Term: Be able to perform more ADLs without symptoms or delay the onset of symptoms    Increase knowledge of respiratory medications and ability to use respiratory devices properly  Yes    Intervention Provide education and demonstration as needed of appropriate use of medications, inhalers, and oxygen therapy.    Expected Outcomes Short Term: Achieves understanding of medications use. Understands that oxygen is a medication prescribed by physician. Demonstrates appropriate use of inhaler and oxygen therapy.;Long Term: Maintain appropriate use of medications, inhalers, and oxygen therapy.    Stress Yes    Intervention Offer individual and/or small group education and counseling on adjustment to heart disease, stress management and health-related lifestyle change. Teach and support self-help strategies.;Refer participants experiencing significant psychosocial distress to appropriate mental health specialists for further evaluation and treatment. When possible, include family members and significant others in education/counseling sessions.    Expected Outcomes Short Term: Participant demonstrates changes in health-related behavior, relaxation and other stress management skills, ability to obtain effective social support, and compliance with psychotropic medications if prescribed.;Long Term: Emotional wellbeing is indicated by absence of clinically significant psychosocial distress or social isolation.          Tobacco Use Initial Evaluation: Social History   Tobacco Use  Smoking Status Former   Current packs/day: 0.00   Average packs/day: 1 pack/day for 52.0 years (52.0 ttl pk-yrs)   Types: Cigarettes   Start date: 47   Quit date: 2021   Years since quitting: 4.7   Passive exposure: Never  Smokeless Tobacco Never    Exercise  Goals and Review:  Exercise Goals     Row Name 11/16/23 0944             Exercise Goals   Increase Physical Activity Yes       Intervention Provide advice, education, support and counseling about physical activity/exercise needs.;Develop an individualized exercise prescription for aerobic and resistive training based on initial evaluation findings, risk stratification, comorbidities and participant's personal goals.       Expected Outcomes Short Term: Attend rehab on a regular basis to increase amount of physical activity.;Long Term: Add in home exercise to make exercise part of routine and to increase amount of physical activity.;Long Term: Exercising regularly at least 3-5 days a week.       Increase Strength and Stamina Yes       Intervention Provide advice, education, support and counseling about physical activity/exercise needs.;Develop an individualized exercise prescription for aerobic and resistive training based on initial evaluation findings, risk stratification, comorbidities and participant's personal goals.       Expected Outcomes Short Term: Increase workloads from initial exercise prescription for resistance, speed, and METs.;Short Term: Perform resistance training exercises routinely during rehab and add in resistance training at home;Long Term: Improve cardiorespiratory fitness, muscular endurance and strength as measured by increased METs and functional capacity (  )       Able to understand and use rate of perceived exertion (RPE) scale Yes       Intervention Provide education and explanation on how to use RPE scale       Expected Outcomes Short Term: Able to use RPE daily in rehab to express subjective intensity level;Long Term:  Able to use RPE to guide intensity level when exercising independently       Able to understand and use Dyspnea scale Yes       Intervention Provide education and explanation on how to use Dyspnea scale       Expected Outcomes Short Term: Able to  use Dyspnea scale daily in rehab to express subjective sense of shortness of breath during exertion;Long Term: Able to use Dyspnea scale to guide intensity level when exercising independently       Knowledge and understanding of Target Heart Rate Range (THRR) Yes       Intervention Provide education and explanation of THRR including how the numbers were predicted and where they are located for reference       Expected Outcomes Short Term: Able to state/look up THRR;Short Term: Able to use daily as guideline for intensity in rehab;Long Term: Able to use THRR to govern intensity when exercising independently       Able to check pulse independently Yes       Intervention Provide education and demonstration on how to check pulse in carotid and radial arteries.;Review the importance of being able to check your own pulse for safety during independent exercise       Expected Outcomes Short Term: Able to explain why pulse checking is important during independent exercise;Long Term: Able to check pulse independently and accurately       Understanding of Exercise Prescription Yes       Intervention Provide education, explanation, and written materials on patient's individual exercise prescription       Expected Outcomes Long Term: Able to explain home exercise prescription to exercise independently;Short Term: Able to explain program exercise prescription        Copy of goals given to participant.

## 2023-11-19 DIAGNOSIS — J449 Chronic obstructive pulmonary disease, unspecified: Secondary | ICD-10-CM | POA: Diagnosis not present

## 2023-11-23 ENCOUNTER — Encounter (HOSPITAL_COMMUNITY)
Admission: RE | Admit: 2023-11-23 | Discharge: 2023-11-23 | Disposition: A | Discharge status: Home / Self Care | Source: Ambulatory Visit | Attending: Pulmonary Disease | Admitting: Pulmonary Disease

## 2023-11-23 DIAGNOSIS — J449 Chronic obstructive pulmonary disease, unspecified: Secondary | ICD-10-CM

## 2023-11-23 NOTE — Progress Notes (Signed)
 Daily Session Note  Patient Details  Name: Dawn Hughes MRN: [id number] Date of Birth: 1955-07-25 Referring Provider:   Flowsheet Row PULMONARY REHAB COPD ORIENTATION from 11/18/2023 in [facility] CARDIAC REHABILITATION  Referring Provider Malka Domino MD    Encounter Date: 11/23/2023  Check In:  Session Check In - 11/23/23 0800       Check-In   Supervising physician immediately available to respond to emergencies See telemetry face sheet for immediately available MD    Location [facility]    Staff Present Powell Benders, BS, Exercise Physiologist;Brittany Jackquline, BSN, RN, WTA-C    Virtual Visit No    Medication changes reported     No    Fall or balance concerns reported    No    Warm-up and Cool-down Performed on first and last piece of equipment    Resistance Training Performed Yes    VAD Patient? No    PAD/SET Patient? No      Pain Assessment   Currently in Pain? No/denies          Capillary Blood Glucose: No results found for this or any previous visit (from the past 24 hours).    Social History   Tobacco Use  Smoking Status Former   Current packs/day: 0.00   Average packs/day: 1 pack/day for 52.0 years (52.0 ttl pk-yrs)   Types: Cigarettes   Start date: 29   Quit date: 2021   Years since quitting: 4.7   Passive exposure: Never  Smokeless Tobacco Never    Goals Met:  Proper associated with RPD/PD & O2 Sat Independence with exercise equipment Using PLB without cueing & demonstrates good technique Exercise tolerated well No report of concerns or symptoms today Strength training completed today  Goals Unmet:  Not Applicable  Comments: First full day of exercise!  Patient was oriented to gym and equipment including functions, settings, policies, and procedures.  Patient's individual exercise prescription and treatment plan were reviewed.  All starting workloads were established based on the results of the 6 minute walk  test done at initial orientation visit.  The plan for exercise progression was also introduced and progression will be customized based on patient's performance and goals.

## 2023-11-25 ENCOUNTER — Encounter (HOSPITAL_COMMUNITY)
Admission: RE | Admit: 2023-11-25 | Discharge: 2023-11-25 | Disposition: A | Discharge status: Home / Self Care | Source: Ambulatory Visit | Attending: Pulmonary Disease | Admitting: Pulmonary Disease

## 2023-11-25 ENCOUNTER — Encounter (HOSPITAL_COMMUNITY)

## 2023-11-25 DIAGNOSIS — J449 Chronic obstructive pulmonary disease, unspecified: Secondary | ICD-10-CM | POA: Diagnosis present

## 2023-11-25 NOTE — Progress Notes (Signed)
 Daily Session Note  Patient Details  Name: Dawn Hughes MRN: [id number] Date of Birth: 06-10-1955 Referring Provider:   Flowsheet Row PULMONARY REHAB COPD ORIENTATION from 11/18/2023 in [facility] CARDIAC REHABILITATION  Referring Provider Malka Domino MD    Encounter Date: 11/25/2023  Check In:  Session Check In - 11/25/23 0800       Check-In   Supervising physician immediately available to respond to emergencies See telemetry face sheet for immediately available MD    Location [facility]    Staff Present Powell Benders, BS, Exercise Physiologist;Debra Vicci, RN, Randye Gelineau, MA, RCEP, CCRP, CCET    Virtual Visit No    Medication changes reported     No    Fall or balance concerns reported    No    Warm-up and Cool-down Performed on first and last piece of equipment    Resistance Training Performed Yes    VAD Patient? No    PAD/SET Patient? No      Pain Assessment   Currently in Pain? No/denies          Capillary Blood Glucose: No results found for this or any previous visit (from the past 24 hours).    Social History   Tobacco Use  Smoking Status Former   Current packs/day: 0.00   Average packs/day: 1 pack/day for 52.0 years (52.0 ttl pk-yrs)   Types: Cigarettes   Start date: 12   Quit date: 2021   Years since quitting: 4.7   Passive exposure: Never  Smokeless Tobacco Never    Goals Met:  Proper associated with RPD/PD & O2 Sat Exercise tolerated well Queuing for purse lip breathing No report of concerns or symptoms today Strength training completed today  Goals Unmet:  Not Applicable  Comments: Pt able to follow exercise prescription today without complaint.  Will continue to monitor for progression.

## 2023-11-27 ENCOUNTER — Other Ambulatory Visit (HOSPITAL_COMMUNITY): Payer: Self-pay | Admitting: Internal Medicine

## 2023-11-27 DIAGNOSIS — Z1231 Encounter for screening mammogram for malignant neoplasm of breast: Secondary | ICD-10-CM

## 2023-11-30 ENCOUNTER — Ambulatory Visit (HOSPITAL_COMMUNITY)

## 2023-11-30 ENCOUNTER — Encounter (HOSPITAL_COMMUNITY)

## 2023-11-30 ENCOUNTER — Telehealth (HOSPITAL_COMMUNITY): Payer: Self-pay

## 2023-12-02 ENCOUNTER — Encounter (HOSPITAL_COMMUNITY)

## 2023-12-02 ENCOUNTER — Encounter (HOSPITAL_COMMUNITY): Payer: Self-pay | Admitting: *Deleted

## 2023-12-02 ENCOUNTER — Encounter (HOSPITAL_COMMUNITY)
Admission: RE | Admit: 2023-12-02 | Discharge: 2023-12-02 | Disposition: A | Source: Ambulatory Visit | Attending: Pulmonary Disease

## 2023-12-02 DIAGNOSIS — J449 Chronic obstructive pulmonary disease, unspecified: Secondary | ICD-10-CM | POA: Diagnosis not present

## 2023-12-02 NOTE — Progress Notes (Signed)
 Daily Session Note  Patient Details  Name: Dawn Hughes MRN: [id number] Date of Birth: May 30, 1955 Referring Provider:   Flowsheet Row PULMONARY REHAB COPD ORIENTATION from 11/18/2023 in [facility] CARDIAC REHABILITATION  Referring Provider Malka Domino MD    Encounter Date: 12/02/2023  Check In:  Session Check In - 12/02/23 0745       Check-In   Supervising physician immediately available to respond to emergencies See telemetry face sheet for immediately available MD    Location [facility]    Staff Present Adrien Louder, RN, BSN;Jessica Vonzell, MA, RCEP, CCRP, CCET;Heather Con, BS, Exercise Physiologist;Brittany Jackquline, BSN, RN, WTA-C    Virtual Visit No    Medication changes reported     No    Fall or balance concerns reported    No    Warm-up and Cool-down Performed on first and last piece of equipment    Resistance Training Performed Yes    VAD Patient? No    PAD/SET Patient? No      Pain Assessment   Currently in Pain? No/denies    Multiple Pain Sites No          Capillary Blood Glucose: No results found for this or any previous visit (from the past 24 hours).    Social History   Tobacco Use  Smoking Status Former   Current packs/day: 0.00   Average packs/day: 1 pack/day for 52.0 years (52.0 ttl pk-yrs)   Types: Cigarettes   Start date: 1   Quit date: 2021   Years since quitting: 4.7   Passive exposure: Never  Smokeless Tobacco Never    Goals Met:  Proper associated with RPD/PD & O2 Sat Independence with exercise equipment Using PLB without cueing & demonstrates good technique Exercise tolerated well No report of concerns or symptoms today Strength training completed today  Goals Unmet:  Not Applicable  Comments: Pt able to follow exercise prescription today without complaint.  Will continue to monitor for progression.

## 2023-12-02 NOTE — Progress Notes (Signed)
 Pulmonary Individual Treatment Plan  Patient Details  Name: Dawn Hughes MRN: [id number] Date of Birth: 05/05/1955 Referring Provider:   Flowsheet Row PULMONARY REHAB COPD ORIENTATION from 11/18/2023 in [facility] CARDIAC REHABILITATION  Referring Provider Assaker, Darrin MD    Initial Encounter Date:  Flowsheet Row PULMONARY REHAB COPD ORIENTATION from 11/18/2023 in [facility]  Date 11/18/23    Visit Diagnosis: Stage 2 moderate COPD by GOLD classification (HCC)  Patient's Home Medications on Admission:   Current Outpatient Medications:    albuterol  (VENTOLIN  HFA) 108 (90 Base) MCG/ACT inhaler, Inhale into the lungs every 6 (six) hours as needed for wheezing or shortness of breath., Disp: , Rfl:    alendronate (FOSAMAX) 70 MG tablet, Take 70 mg by mouth once a week., Disp: , Rfl:    Cyanocobalamin (VITAMIN B12) 1000 MCG TBCR, 1 tablet Orally Once a day; Duration: 30 day(s) (Patient not taking: Reported on 11/16/2023), Disp: , Rfl:    D3-50 1.25 MG (50000 UT) capsule, Take 50,000 Units by mouth once a week., Disp: , Rfl:    DULoxetine  (CYMBALTA ) 60 MG capsule, Take 60 mg by mouth 2 (two) times daily., Disp: , Rfl:    fluticasone-salmeterol (ADVAIR) 250-50 MCG/ACT AEPB, 1 puff 2 (two) times daily as needed., Disp: , Rfl:    Fluticasone-Umeclidin-Vilant (TRELEGY ELLIPTA ) 100-62.5-25 MCG/ACT AEPB, Inhale 1 puff into the lungs daily., Disp: 3 each, Rfl: 3   hydroxychloroquine  (PLAQUENIL ) 200 MG tablet, Take 1 tablet (200 mg total) by mouth daily., Disp: 90 tablet, Rfl: 0   levothyroxine  (SYNTHROID ) 100 MCG tablet, Take 100 mcg by mouth daily., Disp: , Rfl:    metolazone (ZAROXOLYN) 5 MG tablet, TAKE 1 TABLET BY MOUTH EVERY DAY; Duration: 90, Disp: , Rfl:    metoprolol succinate (TOPROL-XL) 25 MG 24 hr tablet, 1 tablet Orally Once a day; Duration: 90 days, Disp: , Rfl:    morphine  (MS CONTIN ) 30 MG 12 hr tablet, Take 30 mg by mouth every 12 (twelve) hours., Disp:  , Rfl:    Multiple Vitamin (MULTIVITAMIN PO), Take by mouth., Disp: , Rfl:    Omega 3 1000 MG CAPS, 1 capsule., Disp: , Rfl:    pantoprazole  (PROTONIX ) 40 MG tablet, Take 40 mg by mouth daily., Disp: , Rfl:    pregabalin  (LYRICA ) 300 MG capsule, Take 300 mg by mouth 2 (two) times daily., Disp: , Rfl:    tiZANidine  (ZANAFLEX ) 4 MG tablet, Take 4 mg by mouth 3 (three) times daily as needed., Disp: , Rfl:   Past Medical History: Past Medical History:  Diagnosis Date   COPD (chronic obstructive pulmonary disease) (HCC)    Fibromyalgia    GAD (generalized anxiety disorder)    GERD (gastroesophageal reflux disease)    Hypertension    Hypothyroidism    Lung nodules    Other intervertebral disc degeneration, lumbar region    Sleep apnea     Tobacco Use: Social History   Tobacco Use  Smoking Status Former   Current packs/day: 0.00   Average packs/day: 1 pack/day for 52.0 years (52.0 ttl pk-yrs)   Types: Cigarettes   Start date: 25   Quit date: 2021   Years since quitting: 4.7   Passive exposure: Never  Smokeless Tobacco Never    Labs: Review Flowsheet        No data to display          Capillary Blood Glucose: No results found for: GLUCAP   Pulmonary Assessment Scores:  Pulmonary  Assessment Scores     Row Name 11/16/23 0957         ADL UCSD   ADL Phase Entry     SOB Score total 42     Rest 2     Walk 3     Stairs 5     Bath 3     Dress 1     Shop 1       CAT Score   CAT Score 17       UCSD: Self-administered rating of dyspnea associated with activities of daily living (ADLs) 6-point scale (0 = not at all to 5 = maximal or unable to do because of breathlessness)  Scoring Scores range from 0 to 120.  Minimally important difference is 5 units  CAT: CAT can identify the health impairment of COPD patients and is better correlated with disease progression.  CAT has a scoring range of zero to 40. The CAT score is classified into four groups of low  (less than 10), medium (10 - 20), high (21-30) and very high (31-40) based on the impact level of disease on health status. A CAT score over 10 suggests significant symptoms.  A worsening CAT score could be explained by an exacerbation, poor medication adherence, poor inhaler technique, or progression of COPD or comorbid conditions.  CAT MCID is 2 points  mMRC: mMRC (Modified Medical Research Council) Dyspnea Scale is used to assess the degree of baseline functional disability in patients of respiratory disease due to dyspnea. No minimal important difference is established. A decrease in score of 1 point or greater is considered a positive change.   Pulmonary Function Assessment:  Pulmonary Function Assessment - 11/16/23 0949       Pulmonary Function Tests   FVC% 66 %    FEV1% 56 %    FEV1/FVC Ratio 64    RV% 123 %    DLCO% 75 %          Exercise Target Goals: Exercise Program Goal: Individual exercise prescription set using results from initial 6 min walk test and THRR while considering  patient's activity barriers and safety.   Exercise Prescription Goal: Initial exercise prescription builds to 30-45 minutes a day of aerobic activity, 2-3 days per week.  Home exercise guidelines will be given to patient during program as part of exercise prescription that the participant will acknowledge.  Activity Barriers & Risk Stratification:  Activity Barriers & Cardiac Risk Stratification - 11/16/23 0943       Activity Barriers & Cardiac Risk Stratification   Activity Barriers Fibromyalgia;History of Falls;Deconditioning;Muscular Weakness;Shortness of Breath          6 Minute Walk:  6 Minute Walk     Row Name 11/18/23 1011         6 Minute Walk   Phase Initial     Distance 1105 feet     Walk Time 6 minutes     # of Rest Breaks 0     MPH 2.09     METS 2.07     RPE 15     Perceived Dyspnea  4     VO2 Peak 7.24     Symptoms Yes (comment)     Comments SOB, back  pain/weakness 8/10     Resting HR 70 bpm     Resting BP 100/60     Resting Oxygen Saturation  92 %     Exercise Oxygen Saturation  during 6 min walk 88 %  Max Ex. HR 83 bpm     Max Ex. BP 148/74     2 Minute Post BP 124/66       Interval HR   1 Minute HR 78     2 Minute HR 83     3 Minute HR 82     4 Minute HR 83     5 Minute HR 83     6 Minute HR 80     2 Minute Post HR 66     Interval Heart Rate? Yes       Interval Oxygen   Interval Oxygen? Yes     Baseline Oxygen Saturation % 92 %     1 Minute Oxygen Saturation % 92 %     1 Minute Liters of Oxygen 0 L  Room Air     2 Minute Oxygen Saturation % 91 %     2 Minute Liters of Oxygen 0 L     3 Minute Oxygen Saturation % 92 %     3 Minute Liters of Oxygen 0 L     4 Minute Oxygen Saturation % 92 %     4 Minute Liters of Oxygen 0 L     5 Minute Oxygen Saturation % 92 %     5 Minute Liters of Oxygen 0 L     6 Minute Oxygen Saturation % 88 %     6 Minute Liters of Oxygen 0 L     2 Minute Post Oxygen Saturation % 95 %     2 Minute Post Liters of Oxygen 0 L        Oxygen Initial Assessment:  Oxygen Initial Assessment - 11/16/23 0945       Home Oxygen   Home Oxygen Device None    Sleep Oxygen Prescription None    Home Exercise Oxygen Prescription None    Home Resting Oxygen Prescription None      Intervention   Short Term Goals To learn and understand importance of monitoring SPO2 with pulse oximeter and demonstrate accurate use of the pulse oximeter.;To learn and understand importance of maintaining oxygen saturations>88%;To learn and demonstrate proper pursed lip breathing techniques or other breathing techniques. ;To learn and demonstrate proper use of respiratory medications    Long  Term Goals Verbalizes importance of monitoring SPO2 with pulse oximeter and return demonstration;Maintenance of O2 saturations>88%;Exhibits proper breathing techniques, such as pursed lip breathing or other method taught during program  session;Compliance with respiratory medication;Demonstrates proper use of MDI's          Oxygen Re-Evaluation:  Oxygen Re-Evaluation     Row Name 11/23/23 0803             Goals/Expected Outcomes   Comments Reviewed PLB technique with pt.  Talked about how it works and it's importance in maintaining their exercise saturations.      Short: Become more profiecient at using PLB.   Long: Become independent at using PLB.          Oxygen Discharge (Final Oxygen Re-Evaluation):  Oxygen Re-Evaluation - 11/23/23 0803       Goals/Expected Outcomes   Comments Reviewed PLB technique with pt.  Talked about how it works and it's importance in maintaining their exercise saturations.      Short: Become more profiecient at using PLB.   Long: Become independent at using PLB.          Initial Exercise Prescription:  Initial Exercise Prescription - 11/18/23 1000  Date of Initial Exercise RX and Referring Provider   Date 11/18/23    Referring Provider Assaker, Darrin MD      Oxygen   Maintain Oxygen Saturation 88% or higher      NuStep   Level 1    SPM 80    Minutes 15    METs 2      Track   Laps 15    Minutes 15    METs 2      Prescription Details   Frequency (times per week) 2    Duration Progress to 30 minutes of continuous aerobic without signs/symptoms of physical distress      Intensity   THRR 40-80% of Max Heartrate 102-134    Ratings of Perceived Exertion 11-13    Perceived Dyspnea 0-4      Progression   Progression Continue to progress workloads to maintain intensity without signs/symptoms of physical distress.      Resistance Training   Training Prescription Yes    Weight 3/2 lb    Reps 10-15          Perform Capillary Blood Glucose checks as needed.  Exercise Prescription Changes:   Exercise Prescription Changes     Row Name 11/18/23 1000 11/25/23 1500           Response to Exercise   Blood Pressure (Admit) 100/60 128/70       Blood Pressure (Exercise) 148/74 126/64      Blood Pressure (Exit) 124/66 142/70      Heart Rate (Admit) 70 bpm 77 bpm      Heart Rate (Exercise) 83 bpm 66 bpm      Heart Rate (Exit) 65 bpm 60 bpm      Oxygen Saturation (Admit) 92 % 93 %      Oxygen Saturation (Exercise) 88 % 92 %      Oxygen Saturation (Exit) 94 % 94 %      Rating of Perceived Exertion (Exercise) 15 12      Perceived Dyspnea (Exercise) 4 3      Symptoms SOB, back pain/weakness (8/10) --      Comments walk test results --      Duration -- Continue with 30 min of aerobic exercise without signs/symptoms of physical distress.      Intensity -- THRR unchanged        Progression   Progression -- Continue to progress workloads to maintain intensity without signs/symptoms of physical distress.        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 First full day of exercise!  Patient was oriented to gym and equipment including functions, settings, policies, and procedures.  Patient's individual exercise prescription and treatment plan were reviewed.  All starting workloads were established based on the results of the 6 minute walk test done at initial orientation visit.  The plan for exercise progression was also introduced and progression will be customized based on patient's performance and goals.         Exercise Goals and Review:   Exercise Goals     Row Name 11/16/23 0944             Exercise Goals   Increase Physical Activity Yes       Intervention Provide advice, education, support and  counseling about physical activity/exercise needs.;Develop an individualized exercise prescription for aerobic and resistive training based on initial evaluation findings, risk stratification, comorbidities and participant's personal goals.       Expected Outcomes Short Term: Attend rehab on a regular basis to increase amount of physical activity.;Long Term: Add in home exercise to make exercise part of routine and to increase amount of physical activity.;Long Term: Exercising regularly at least 3-5 days a week.       Increase Strength and Stamina Yes       Intervention Provide advice, education, support and counseling about physical activity/exercise needs.;Develop an individualized exercise prescription for aerobic and resistive training based on initial evaluation findings, risk stratification, comorbidities and participant's personal goals.       Expected Outcomes Short Term: Increase workloads from initial exercise prescription for resistance, speed, and METs.;Short Term: Perform resistance training exercises routinely during rehab and add in resistance training at home;Long Term: Improve cardiorespiratory fitness, muscular endurance and strength as measured by increased METs and functional capacity ( )       Able to understand and use rate of perceived exertion (RPE) scale Yes       Intervention Provide education and explanation on how to use RPE scale       Expected Outcomes Short Term: Able to use RPE daily in rehab to express subjective intensity level;Long Term:  Able to use RPE to guide intensity level when exercising independently       Able to understand and use Dyspnea scale Yes       Intervention Provide education and explanation on how to use Dyspnea scale       Expected Outcomes Short Term: Able to use Dyspnea scale daily in rehab to express subjective sense of shortness of breath during exertion;Long Term: Able to use Dyspnea scale to guide intensity level when exercising independently        Knowledge and understanding of Target Heart Rate Range (THRR) Yes       Intervention Provide education and explanation of THRR including how the numbers were predicted and where they are located for reference       Expected Outcomes Short Term: Able to state/look up THRR;Short Term: Able to use daily as guideline for intensity in rehab;Long Term: Able to use THRR to govern intensity when exercising independently       Able to check pulse independently Yes       Intervention Provide education and demonstration on how to check pulse in carotid and radial arteries.;Review the importance of being able to check your own pulse for safety during independent exercise       Expected Outcomes Short Term: Able to explain why pulse checking is important during independent exercise;Long Term: Able to check pulse independently and accurately       Understanding of Exercise Prescription Yes  Intervention Provide education, explanation, and written materials on patient's individual exercise prescription       Expected Outcomes Long Term: Able to explain home exercise prescription to exercise independently;Short Term: Able to explain program exercise prescription          Exercise Goals Re-Evaluation :  Exercise Goals Re-Evaluation     Row Name 11/23/23 0804 11/26/23 0820           Exercise Goal Re-Evaluation   Exercise Goals Review -- Increase Physical Activity;Increase Strength and Stamina;Understanding of Exercise Prescription      Comments Reviewed RPE and dyspnea scale, THR and program prescription with pt today.  Pt voiced understanding and was given a copy of goals to take home.       Short: Use RPE daily to regulate intensity.  Long: Follow program prescription in THR. Danyiel has just started CR. She did feel sore after her first day. She is on the nustep and the arm ergometer both on level 1. Will continue to monitor and progress as able.      Expected Outcomes -- Short: increase level  when able   long: continue to attend CR         Discharge Exercise Prescription (Final Exercise Prescription Changes):  Exercise Prescription Changes - 11/25/23 1500       Response to Exercise   Blood Pressure (Admit) 128/70    Blood Pressure (Exercise) 126/64    Blood Pressure (Exit) 142/70    Heart Rate (Admit) 77 bpm    Heart Rate (Exercise) 66 bpm    Heart Rate (Exit) 60 bpm    Oxygen Saturation (Admit) 93 %    Oxygen Saturation (Exercise) 92 %    Oxygen Saturation (Exit) 94 %    Rating of Perceived Exertion (Exercise) 12    Perceived Dyspnea (Exercise) 3    Duration Continue with 30 min of aerobic exercise without signs/symptoms of physical distress.    Intensity THRR unchanged      Progression   Progression Continue to progress workloads to maintain intensity without signs/symptoms of physical distress.      Resistance Training   Training Prescription Yes    Weight 3/2    Reps 10-15      NuStep   Level 1    SPM 54    Minutes 15    METs 1.7      Arm Ergometer   Level 1    Watts 38    Minutes 15    METs 1.6          Nutrition:  Target Goals: Understanding of nutrition guidelines, daily intake of sodium 1500mg , cholesterol 200mg , calories 30% from fat and 7% or less from saturated fats, daily to have 5 or more servings of fruits and vegetables.  Biometrics:  Pre Biometrics - 11/18/23 1017       Pre Biometrics   Height 5' (1.524 m)    Weight 172 lb 12.8 oz (78.4 kg)    Waist Circumference 38 inches    Hip Circumference 44 inches    Waist to Hip Ratio 0.86 %    BMI (Calculated) 33.75    Grip Strength 19 kg    Single Leg Stand 1.9 seconds           Nutrition Therapy Plan and Nutrition Goals:  Nutrition Therapy & Goals - 11/16/23 0950       Intervention Plan   Intervention Prescribe, educate and counsel regarding individualized specific dietary modifications aiming towards targeted  core components such as weight, hypertension, lipid  management, diabetes, heart failure and other comorbidities.;Nutrition handout(s) given to patient.    Expected Outcomes Short Term Goal: Understand basic principles of dietary content, such as calories, fat, sodium, cholesterol and nutrients.;Short Term Goal: A plan has been developed with personal nutrition goals set during dietitian appointment.;Long Term Goal: Adherence to prescribed nutrition plan.          Nutrition Assessments:  Nutrition Assessments - 11/16/23 0959       Rate Your Plate Scores   Pre Score 47         MEDIFICTS Score Key: >=70 Need to make dietary changes  40-70 Heart Healthy Diet <= 40 Therapeutic Level Cholesterol Diet  Flowsheet Row PULMONARY VIRTUAL BASED CARE from 11/16/2023 in [facility] CARDIAC REHABILITATION  Picture Your Plate Total Score on Admission 47   Picture Your Plate Scores: <59 Unhealthy dietary pattern with much room for improvement. 41-50 Dietary pattern unlikely to meet recommendations for good health and room for improvement. 51-60 More healthful dietary pattern, with some room for improvement.  >60 Healthy dietary pattern, although there may be some specific behaviors that could be improved.    Nutrition Goals Re-Evaluation:   Nutrition Goals Discharge (Final Nutrition Goals Re-Evaluation):   Psychosocial: Target Goals: Acknowledge presence or absence of significant depression and/or stress, maximize coping skills, provide positive support system. Participant is able to verbalize types and ability to use techniques and skills needed for reducing stress and depression.  Initial Review & Psychosocial Screening:  Initial Psych Review & Screening - 11/16/23 0950       Initial Review   Current issues with Current Stress Concerns;Current Psychotropic Meds    Source of Stress Concerns Chronic Illness;Occupation    Comments Diedre states she is stressed a lot of the time, sometimes she feels like she makes her own stress. Has a  history of anxiety.      Family Dynamics   Good Support System? Yes    Comments Bunnie lives alone with her dog, she says if she had an emergency to call her son who is on her contact list.      Barriers   Psychosocial barriers to participate in program There are no identifiable barriers or psychosocial needs.      Screening Interventions   Interventions Encouraged to exercise    Expected Outcomes Short Term goal: Utilizing psychosocial counselor, staff and physician to assist with identification of specific Stressors or current issues interfering with healing process. Setting desired goal for each stressor or current issue identified.;Long Term Goal: Stressors or current issues are controlled or eliminated.;Short Term goal: Identification and review with participant of any Quality of Life or Depression concerns found by scoring the questionnaire.;Long Term goal: The participant improves quality of Life and PHQ9 Scores as seen by post scores and/or verbalization of changes          Quality of Life Scores:  Scores of 19 and below usually indicate a poorer quality of life in these areas.  A difference of  2-3 points is a clinically meaningful difference.  A difference of 2-3 points in the total score of the Quality of Life Index has been associated with significant improvement in overall quality of life, self-image, physical symptoms, and general health in studies assessing change in quality of life.   PHQ-9: Review Flowsheet       11/18/2023 06/18/2020  Depression screen PHQ 2/9  Decreased Interest 1 0  Down, Depressed, Hopeless 1  0  PHQ - 2 Score 2 0  Altered sleeping 1 0  Tired, decreased energy 1 3  Change in appetite 1 0  Feeling bad or failure about yourself  1 0  Trouble concentrating 0 0  Moving slowly or fidgety/restless 0 0  Suicidal thoughts 0 0  PHQ-9 Score 6 3   Interpretation of Total Score  Total Score Depression Severity:  1-4 = Minimal depression, 5-9 = Mild  depression, 10-14 = Moderate depression, 15-19 = Moderately severe depression, 20-27 = Severe depression   Psychosocial Evaluation and Intervention:  Psychosocial Evaluation - 11/16/23 0952       Psychosocial Evaluation & Interventions   Interventions Stress management education;Relaxation education;Encouraged to exercise with the program and follow exercise prescription    Comments Keven is a pleasant 68 year old female who is coming into rehab for her COPD. She lives home alone with her dog. 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 She also walks some at home. She states she gets around ok, is a little wobbly at times, uses no assistive device. She has had a fall in the last year where she fell into her rose bushes at home. She wears no oxygen, but is supposed to wear a CPAP at night but when she moved here from [place]  she lost it and didn't feel like getting another one.    Expected Outcomes Short: Increase strength and stamina. Long: Improve shortness of breath.    Continue Psychosocial Services  Follow up required by staff          Psychosocial Re-Evaluation:   Psychosocial Discharge (Final Psychosocial Re-Evaluation):    Education: Education Goals: Education classes will be provided on a weekly basis, covering required topics. Participant will state understanding/return demonstration of topics presented.  Learning Barriers/Preferences:  Learning Barriers/Preferences - 11/16/23 0945       Learning Barriers/Preferences   Learning Barriers Sight   wears glasses   Learning Preferences None          Education Topics: How Lungs Work and Diseases: - Discuss the anatomy of the lungs and diseases that can affect the lungs, such as COPD.   Exercise: -Discuss the importance of exercise, FITT principles of exercise, normal and abnormal responses to exercise, and how to exercise safely.   Environmental Irritants: -Discuss types of  environmental irritants and how to limit exposure to environmental irritants.   Meds/Inhalers and oxygen: - Discuss respiratory medications, definition of an inhaler and oxygen, and the proper way to use an inhaler and oxygen.   Energy Saving Techniques: - Discuss methods to conserve energy and decrease shortness of breath when performing activities of daily living.    Bronchial Hygiene / Breathing Techniques: - Discuss breathing mechanics, pursed-lip breathing technique,  proper posture, effective ways to clear airways, and other functional breathing techniques   Cleaning Equipment: - Provides group verbal and written instruction about the health risks of elevated stress, cause of high stress, and healthy ways to reduce stress.   Nutrition I: Fats: - Discuss the types of cholesterol, what cholesterol does to the body, and how cholesterol levels can be controlled.   Nutrition II: Labels: -Discuss the different components of food labels and how to read food labels.   Respiratory Infections: - Discuss the signs and symptoms of respiratory infections, ways to prevent respiratory infections, and the importance of seeking medical treatment when having a respiratory infection.   Stress I: Signs and Symptoms: -  Discuss the causes of stress, how stress may lead to anxiety and depression, and ways to limit stress.   Stress II: Relaxation: -Discuss relaxation techniques to limit stress.   Oxygen for Home/Travel: - Discuss how to prepare for travel when on oxygen and proper ways to transport and store oxygen to ensure safety.   Knowledge Questionnaire Score:  Knowledge Questionnaire Score - 11/16/23 0957       Knowledge Questionnaire Score   Pre Score 14/18          Core Components/Risk Factors/Patient Goals at Admission:  Personal Goals and Risk Factors at Admission - 11/18/23 1017       Core Components/Risk Factors/Patient Goals on Admission    Weight Management  Yes;Obesity;Weight Loss    Intervention Weight Management: Develop a combined nutrition and exercise program designed to reach desired caloric intake, while maintaining appropriate intake of nutrient and fiber, sodium and fats, and appropriate energy expenditure required for the weight goal.;Weight Management: Provide education and appropriate resources to help participant work on and attain dietary goals.;Weight Management/Obesity: Establish reasonable short term and long term weight goals.;Obesity: Provide education and appropriate resources to help participant work on and attain dietary goals.    Admit Weight 172 lb 12.8 oz (78.4 kg)    Goal Weight: Short Term 170 lb (77.1 kg)    Goal Weight: Long Term 165 lb (74.8 kg)    Expected Outcomes Short Term: Continue to assess and modify interventions until short term weight is achieved;Long Term: Adherence to nutrition and physical activity/exercise program aimed toward attainment of established weight goal;Weight Loss: Understanding of general recommendations for a balanced deficit meal plan, which promotes 1-2 lb weight loss per week and includes a negative energy balance of [phone] kcal/d;Understanding recommendations for meals to include 15-35% energy as protein, 25-35% energy from fat, 35-60% energy from carbohydrates, less than 200mg  of dietary cholesterol, 20-35 gm of total fiber daily;Understanding of distribution of calorie intake throughout the day with the consumption of 4-5 meals/snacks    Improve shortness of breath with ADL's Yes    Intervention Provide education, individualized exercise plan and daily activity instruction to help decrease symptoms of SOB with activities of daily living.    Expected Outcomes Short Term: Improve cardiorespiratory fitness to achieve a reduction of symptoms when performing ADLs;Long Term: Be able to perform more ADLs without symptoms or delay the onset of symptoms    Increase knowledge of respiratory medications  and ability to use respiratory devices properly  Yes    Intervention Provide education and demonstration as needed of appropriate use of medications, inhalers, and oxygen therapy.    Expected Outcomes Short Term: Achieves understanding of medications use. Understands that oxygen is a medication prescribed by physician. Demonstrates appropriate use of inhaler and oxygen therapy.;Long Term: Maintain appropriate use of medications, inhalers, and oxygen therapy.    Stress Yes    Intervention Offer individual and/or small group education and counseling on adjustment to heart disease, stress management and health-related lifestyle change. Teach and support self-help strategies.;Refer participants experiencing significant psychosocial distress to appropriate mental health specialists for further evaluation and treatment. When possible, include family members and significant others in education/counseling sessions.    Expected Outcomes Short Term: Participant demonstrates changes in health-related behavior, relaxation and other stress management skills, ability to obtain effective social support, and compliance with psychotropic medications if prescribed.;Long Term: Emotional wellbeing is indicated by absence of clinically significant psychosocial distress or social isolation.  Core Components/Risk Factors/Patient Goals Review:    Core Components/Risk Factors/Patient Goals at Discharge (Final Review):    ITP Comments:  ITP Comments     Row Name 11/16/23 0949 11/18/23 0930 11/23/23 0801       ITP Comments Completed virtual orientation today.  EP evaluation is scheduled for 9/24 at 0830 .  Documentation for diagnosis can be found in [facility] encounter 10/29/23. Patient arrived for 1st visit/orientation/education at 0830. Patient was referred to PR by Dr. Malka due to COPD. During orientation advised patient on arrival and appointment times what to wear, what to do before, during and after exercise.  Reviewed attendance and class policy.  Pt is scheduled to return Pulmonary Rehab on 11/23/23 at 745. Pt was advised to come to class 15 minutes before class starts.  Discussed RPE/Dpysnea scales. Patient participated in warm up stretches. Patient was able to complete 6 minute walk test. Patient was measured for the equipment. Discussed equipment safety with patient. Took patient pre-anthropometric measurements. Patient finished visit at 945. First full day of exercise!  Patient was oriented to gym and equipment including functions, settings, policies, and procedures.  Patient's individual exercise prescription and treatment plan were reviewed.  All starting workloads were established based on the results of the 6 minute walk test done at initial orientation visit.  The plan for exercise progression was also introduced and progression will be customized based on patient's performance and goals.        Comments: 30 day review

## 2023-12-03 ENCOUNTER — Ambulatory Visit (HOSPITAL_COMMUNITY)
Admission: RE | Admit: 2023-12-03 | Discharge: 2023-12-03 | Disposition: A | Discharge status: Home / Self Care | Source: Ambulatory Visit | Attending: Internal Medicine | Admitting: Internal Medicine

## 2023-12-03 DIAGNOSIS — Z1231 Encounter for screening mammogram for malignant neoplasm of breast: Secondary | ICD-10-CM | POA: Insufficient documentation

## 2023-12-07 ENCOUNTER — Encounter (HOSPITAL_COMMUNITY)

## 2023-12-07 ENCOUNTER — Encounter (HOSPITAL_COMMUNITY)
Admission: RE | Admit: 2023-12-07 | Discharge: 2023-12-07 | Disposition: A | Source: Ambulatory Visit | Attending: Pulmonary Disease

## 2023-12-07 DIAGNOSIS — J449 Chronic obstructive pulmonary disease, unspecified: Secondary | ICD-10-CM

## 2023-12-07 DIAGNOSIS — M0609 Rheumatoid arthritis without rheumatoid factor, multiple sites: Secondary | ICD-10-CM | POA: Diagnosis not present

## 2023-12-07 DIAGNOSIS — Z79899 Other long term (current) drug therapy: Secondary | ICD-10-CM | POA: Diagnosis not present

## 2023-12-07 NOTE — Progress Notes (Signed)
 Daily Session Note  Patient Details  Name: ADASYN MCADAMS MRN: [id number] Date of Birth: 04/03/1955 Referring Provider:   Flowsheet Row PULMONARY REHAB COPD ORIENTATION from 11/18/2023 in [facility] CARDIAC REHABILITATION  Referring Provider Malka Domino MD    Encounter Date: 12/07/2023  Check In:  Session Check In - 12/07/23 0802       Check-In   Supervising physician immediately available to respond to emergencies See telemetry face sheet for immediately available MD    Location [facility]    Staff Present Powell Benders, BS, Exercise Physiologist;Tashawn Laswell Vonzell, MA, RCEP, CCRP, Sueellen Louder, RN, BSN    Virtual Visit No    Medication changes reported     No    Fall or balance concerns reported    No    Warm-up and Cool-down Performed on first and last piece of equipment    Resistance Training Performed Yes    VAD Patient? No    PAD/SET Patient? No      Pain Assessment   Currently in Pain? No/denies          Capillary Blood Glucose: No results found for this or any previous visit (from the past 24 hours).    Social History   Tobacco Use  Smoking Status Former   Current packs/day: 0.00   Average packs/day: 1 pack/day for 52.0 years (52.0 ttl pk-yrs)   Types: Cigarettes   Start date: 32   Quit date: 2021   Years since quitting: 4.7   Passive exposure: Never  Smokeless Tobacco Never    Goals Met:  Independence with exercise equipment Exercise tolerated well No report of concerns or symptoms today Strength training completed today  Goals Unmet:  Not Applicable  Comments: Pt able to follow exercise prescription today without complaint.  Will continue to monitor for progression.

## 2023-12-09 ENCOUNTER — Encounter (HOSPITAL_COMMUNITY)

## 2023-12-09 ENCOUNTER — Encounter (HOSPITAL_COMMUNITY)
Admission: RE | Admit: 2023-12-09 | Discharge: 2023-12-09 | Disposition: A | Discharge status: Home / Self Care | Source: Ambulatory Visit | Attending: Pulmonary Disease | Admitting: Pulmonary Disease

## 2023-12-09 ENCOUNTER — Telehealth: Payer: Self-pay | Admitting: *Deleted

## 2023-12-09 DIAGNOSIS — J449 Chronic obstructive pulmonary disease, unspecified: Secondary | ICD-10-CM

## 2023-12-09 NOTE — Progress Notes (Signed)
 Daily Session Note  Patient Details  Name: LATONGA PONDER MRN: [id number] Date of Birth: 05-28-1955 Referring Provider:   Flowsheet Row PULMONARY REHAB COPD ORIENTATION from 11/18/2023 in [facility] CARDIAC REHABILITATION  Referring Provider Malka Domino MD    Encounter Date: 12/09/2023  Check In:  Session Check In - 12/09/23 0756       Check-In   Supervising physician immediately available to respond to emergencies See telemetry face sheet for immediately available MD    Location [facility]    Staff Present Powell Benders, BS, Exercise Physiologist;Jessica Vonzell, MA, RCEP, CCRP, CCET;Brittany Jackquline, BSN, RN, WTA-C    Virtual Visit No    Medication changes reported     No    Fall or balance concerns reported    No    Tobacco Cessation No Change    Warm-up and Cool-down Performed on first and last piece of equipment    Resistance Training Performed Yes    VAD Patient? No    PAD/SET Patient? No      Pain Assessment   Currently in Pain? No/denies    Multiple Pain Sites No          Capillary Blood Glucose: No results found for this or any previous visit (from the past 24 hours).    Social History   Tobacco Use  Smoking Status Former   Current packs/day: 0.00   Average packs/day: 1 pack/day for 52.0 years (52.0 ttl pk-yrs)   Types: Cigarettes   Start date: 69   Quit date: 2021   Years since quitting: 4.7   Passive exposure: Never  Smokeless Tobacco Never    Goals Met:  Independence with exercise equipment Exercise tolerated well No report of concerns or symptoms today Strength training completed today  Goals Unmet:  Not Applicable  Comments: Pt able to follow exercise prescription today without complaint.  Will continue to monitor for progression.

## 2023-12-09 NOTE — Telephone Encounter (Signed)
 Received PLQ eye exam from [facility] stating 10-2 Visual Field unreliable, HCT: normal. No Toxicity noted [facility] f/u 6 months.   Patient did not want to be referred to Dr. Alvia. Patient would like to go back to [facility] care in 6 months.  Per Dr.Deveshwar okay to return to [facility].  Attempted to contact the patient and left message for patient to call the office

## 2023-12-10 NOTE — Telephone Encounter (Signed)
 Attempted to contact the patient and left message for patient to call the office.

## 2023-12-14 ENCOUNTER — Encounter (HOSPITAL_COMMUNITY)

## 2023-12-14 NOTE — Telephone Encounter (Signed)
 Left message to advise patient per Dr.Deveshwar okay to return to [facility].

## 2023-12-16 ENCOUNTER — Encounter (HOSPITAL_COMMUNITY)
Admission: RE | Admit: 2023-12-16 | Discharge: 2023-12-16 | Disposition: A | Discharge status: Home / Self Care | Source: Ambulatory Visit | Attending: Pulmonary Disease | Admitting: Pulmonary Disease

## 2023-12-16 ENCOUNTER — Encounter (HOSPITAL_COMMUNITY)

## 2023-12-16 DIAGNOSIS — J449 Chronic obstructive pulmonary disease, unspecified: Secondary | ICD-10-CM | POA: Diagnosis not present

## 2023-12-16 NOTE — Progress Notes (Signed)
 Daily Session Note  Patient Details  Name: JEANINE CAVEN MRN: [id number] Date of Birth: [birth year] Referring Provider:   Flowsheet Row PULMONARY REHAB COPD ORIENTATION from 11/18/2023 in [facility] CARDIAC REHABILITATION  Referring Provider Malka Domino MD    Encounter Date: 12/16/2023  Check In:  Session Check In - 12/16/23 0750       Check-In   Supervising physician immediately available to respond to emergencies See telemetry face sheet for immediately available MD    Location [facility]    Staff Present Harlene Gelineau, MA, RCEP, CCRP, CCET;Ellouise Gear, RN    Virtual Visit No    Medication changes reported     No    Fall or balance concerns reported    No    Tobacco Cessation No Change    Warm-up and Cool-down Performed on first and last piece of equipment    Resistance Training Performed Yes    VAD Patient? No    PAD/SET Patient? No      Pain Assessment   Currently in Pain? No/denies          Capillary Blood Glucose: No results found for this or any previous visit (from the past 24 hours).    Social History   Tobacco Use  Smoking Status Former   Current packs/day: 0.00   Average packs/day: 1 pack/day for 52.0 years (52.0 ttl pk-yrs)   Types: Cigarettes   Start date: 64   Quit date: 2021   Years since quitting: 4.8   Passive exposure: Never  Smokeless Tobacco Never    Goals Met:  Proper associated with RPD/PD & O2 Sat Independence with exercise equipment Improved SOB with ADL's Using PLB without cueing & demonstrates good technique Exercise tolerated well No report of concerns or symptoms today Strength training completed today  Goals Unmet:  Not Applicable  Comments: Pt able to follow exercise prescription today without complaint.  Will continue to monitor for progression.

## 2023-12-21 ENCOUNTER — Encounter (HOSPITAL_COMMUNITY)

## 2023-12-21 ENCOUNTER — Encounter (HOSPITAL_COMMUNITY)
Admission: RE | Admit: 2023-12-21 | Discharge: 2023-12-21 | Disposition: A | Discharge status: Home / Self Care | Source: Ambulatory Visit | Attending: Pulmonary Disease | Admitting: Pulmonary Disease

## 2023-12-21 DIAGNOSIS — J449 Chronic obstructive pulmonary disease, unspecified: Secondary | ICD-10-CM

## 2023-12-21 NOTE — Progress Notes (Signed)
 Daily Session Note  Patient Details  Name: HESSIE VARONE MRN: [id number] Date of Birth: [birth year] Referring Provider:   Flowsheet Row PULMONARY REHAB COPD ORIENTATION from 11/18/2023 in [facility] CARDIAC REHABILITATION  Referring Provider Malka Domino MD    Encounter Date: 12/21/2023  Check In:  Session Check In - 12/21/23 0754       Check-In   Supervising physician immediately available to respond to emergencies See telemetry face sheet for immediately available MD    Location [facility]    Staff Present Powell Benders, BS, Exercise Physiologist;Brittany Jackquline, BSN, RN, Rosalba Gelineau, MA, RCEP, CCRP, CCET    Virtual Visit No    Medication changes reported     No    Fall or balance concerns reported    No    Tobacco Cessation No Change    Warm-up and Cool-down Performed on first and last piece of equipment    Resistance Training Performed Yes    VAD Patient? No    PAD/SET Patient? No      Pain Assessment   Currently in Pain? No/denies    Multiple Pain Sites No          Capillary Blood Glucose: No results found for this or any previous visit (from the past 24 hours).    Social History   Tobacco Use  Smoking Status Former   Current packs/day: 0.00   Average packs/day: 1 pack/day for 52.0 years (52.0 ttl pk-yrs)   Types: Cigarettes   Start date: 21   Quit date: 2021   Years since quitting: 4.8   Passive exposure: Never  Smokeless Tobacco Never    Goals Met:  Independence with exercise equipment Exercise tolerated well No report of concerns or symptoms today Strength training completed today  Goals Unmet:  Not Applicable  Comments: Pt able to follow exercise prescription today without complaint.  Will continue to monitor for progression.

## 2023-12-23 ENCOUNTER — Encounter (HOSPITAL_COMMUNITY): Admission: RE | Admit: 2023-12-23 | Source: Ambulatory Visit

## 2023-12-23 ENCOUNTER — Telehealth (HOSPITAL_COMMUNITY): Payer: Self-pay | Admitting: Surgery

## 2023-12-23 ENCOUNTER — Encounter (HOSPITAL_COMMUNITY)

## 2023-12-23 NOTE — Telephone Encounter (Signed)
 I called the pt since she missed her pulmonary rehab session today.   VM left for pt to call our office back.

## 2023-12-28 ENCOUNTER — Encounter (HOSPITAL_COMMUNITY)
Admission: RE | Admit: 2023-12-28 | Discharge: 2023-12-28 | Disposition: A | Discharge status: Home / Self Care | Source: Ambulatory Visit | Attending: Pulmonary Disease | Admitting: Pulmonary Disease

## 2023-12-28 ENCOUNTER — Encounter (HOSPITAL_COMMUNITY)

## 2023-12-28 DIAGNOSIS — J449 Chronic obstructive pulmonary disease, unspecified: Secondary | ICD-10-CM | POA: Diagnosis present

## 2023-12-28 NOTE — Progress Notes (Signed)
 Daily Session Note  Patient Details  Name: Dawn Hughes MRN: [id number] Date of Birth: 14-Aug-1955 Referring Provider:   Flowsheet Row PULMONARY REHAB COPD ORIENTATION from 11/18/2023 in [facility] CARDIAC REHABILITATION  Referring Provider Malka Domino MD    Encounter Date: 12/28/2023  Check In:  Session Check In - 12/28/23 0749       Check-In   Supervising physician immediately available to respond to emergencies See telemetry face sheet for immediately available MD    Location [facility]    Staff Present Powell Benders, BS, Exercise Physiologist;Aspen Lawrance Jackquline, BSN, RN, Rosalba Gelineau, MA, RCEP, CCRP, CCET    Virtual Visit No    Medication changes reported     No    Fall or balance concerns reported    No    Tobacco Cessation No Change    Warm-up and Cool-down Performed on first and last piece of equipment    Resistance Training Performed Yes    VAD Patient? No    PAD/SET Patient? No      Pain Assessment   Currently in Pain? No/denies          Capillary Blood Glucose: No results found for this or any previous visit (from the past 24 hours).    Social History   Tobacco Use  Smoking Status Former   Current packs/day: 0.00   Average packs/day: 1 pack/day for 52.0 years (52.0 ttl pk-yrs)   Types: Cigarettes   Start date: 68   Quit date: 2021   Years since quitting: 4.8   Passive exposure: Never  Smokeless Tobacco Never    Goals Met:  Proper associated with RPD/PD & O2 Sat Independence with exercise equipment Improved SOB with ADL's Using PLB without cueing & demonstrates good technique Exercise tolerated well No report of concerns or symptoms today Strength training completed today  Goals Unmet:  Not Applicable  Comments: Pt able to follow exercise prescription today without complaint.  Will continue to monitor for progression.

## 2023-12-30 ENCOUNTER — Encounter (HOSPITAL_COMMUNITY): Payer: Self-pay

## 2023-12-30 ENCOUNTER — Encounter (HOSPITAL_COMMUNITY)
Admission: RE | Admit: 2023-12-30 | Discharge: 2023-12-30 | Disposition: A | Source: Ambulatory Visit | Attending: Pulmonary Disease

## 2023-12-30 ENCOUNTER — Encounter (HOSPITAL_COMMUNITY)

## 2023-12-30 DIAGNOSIS — J449 Chronic obstructive pulmonary disease, unspecified: Secondary | ICD-10-CM

## 2023-12-30 NOTE — Progress Notes (Cosign Needed Addendum)
 Pulmonary Individual Treatment Plan  Patient Details  Name: Dawn Hughes MRN: [id number] Date of Birth: Feb 16, 1956 Referring Provider:   Flowsheet Row PULMONARY REHAB COPD ORIENTATION from 11/18/2023 in [facility] CARDIAC REHABILITATION  Referring Provider Assaker, Darrin MD    Initial Encounter Date:  Flowsheet Row PULMONARY REHAB COPD ORIENTATION from 11/18/2023 in [facility]ATION  Date 11/18/23    Visit Diagnosis: Stage 2 moderate COPD by GOLD classification (HCC)  Patient's Home Medications on Admission:  Current Outpatient Medications:    albuterol  (VENTOLIN  HFA) 108 (90 Base) MCG/ACT inhaler, Inhale into the lungs every 6 (six) hours as needed for wheezing or shortness of breath., Disp: , Rfl:    alendronate (FOSAMAX) 70 MG tablet, Take 70 mg by mouth once a week., Disp: , Rfl:    Cyanocobalamin (VITAMIN B12) 1000 MCG TBCR, 1 tablet Orally Once a day; Duration: 30 day(s) (Patient not taking: Reported on 11/16/2023), Disp: , Rfl:    D3-50 1.25 MG (50000 UT) capsule, Take 50,000 Units by mouth once a week., Disp: , Rfl:    DULoxetine  (CYMBALTA ) 60 MG capsule, Take 60 mg by mouth 2 (two) times daily., Disp: , Rfl:    fluticasone-salmeterol (ADVAIR) 250-50 MCG/ACT AEPB, 1 puff 2 (two) times daily as needed., Disp: , Rfl:    Fluticasone-Umeclidin-Vilant (TRELEGY ELLIPTA ) 100-62.5-25 MCG/ACT AEPB, Inhale 1 puff into the lungs daily., Disp: 3 each, Rfl: 3   hydroxychloroquine  (PLAQUENIL ) 200 MG tablet, Take 1 tablet (200 mg total) by mouth daily., Disp: 90 tablet, Rfl: 0   levothyroxine  (SYNTHROID ) 100 MCG tablet, Take 100 mcg by mouth daily., Disp: , Rfl:    metolazone (ZAROXOLYN) 5 MG tablet, TAKE 1 TABLET BY MOUTH EVERY DAY; Duration: 90, Disp: , Rfl:    metoprolol succinate (TOPROL-XL) 25 MG 24 hr tablet, 1 tablet Orally Once a day; Duration: 90 days, Disp: , Rfl:    morphine  (MS CONTIN ) 30 MG 12 hr tablet, Take 30 mg by mouth every 12 (twelve) hours., Disp: ,  Rfl:    Multiple Vitamin (MULTIVITAMIN PO), Take by mouth., Disp: , Rfl:    Omega 3 1000 MG CAPS, 1 capsule., Disp: , Rfl:    pantoprazole  (PROTONIX ) 40 MG tablet, Take 40 mg by mouth daily., Disp: , Rfl:    pregabalin  (LYRICA ) 300 MG capsule, Take 300 mg by mouth 2 (two) times daily., Disp: , Rfl:    tiZANidine  (ZANAFLEX ) 4 MG tablet, Take 4 mg by mouth 3 (three) times daily as needed., Disp: , Rfl:   Past Medical History: Past Medical History:  Diagnosis Date   COPD (chronic obstructive pulmonary disease) (HCC)    Fibromyalgia    GAD (generalized anxiety disorder)    GERD (gastroesophageal reflux disease)    Hypertension    Hypothyroidism    Lung nodules    Other intervertebral disc degeneration, lumbar region    Sleep apnea     Tobacco Use: Social History   Tobacco Use  Smoking Status Former   Current packs/day: 0.00   Average packs/day: 1 pack/day for 52.0 years (52.0 ttl pk-yrs)   Types: Cigarettes   Start date: 70   Quit date: 70   Years since quitting: 4.8   Passive exposure: Never  Smokeless Tobacco Never    Labs: Review Flowsheet        No data to display           Pulmonary Assessment Scores:  Pulmonary Assessment Scores     Row Name 11/16/23 [phone]  ADL UCSD   ADL Phase Entry     SOB Score total 42     Rest 2     Walk 3     Stairs 5     Bath 3     Dress 1     Shop 1       CAT Score   CAT Score 17        UCSD: Self-administered rating of dyspnea associated with activities of daily living (ADLs) 6-point scale (0 = not at all to 5 = maximal or unable to do because of breathlessness)  Scoring Scores range from 0 to 120.  Minimally important difference is 5 units  CAT: CAT can identify the health impairment of COPD patients and is better correlated with disease progression.  CAT has a scoring range of zero to 40. The CAT score is classified into four groups of low (less than 10), medium (10 - 20), high (21-30) and very high  (31-40) based on the impact level of disease on health status. A CAT score over 10 suggests significant symptoms.  A worsening CAT score could be explained by an exacerbation, poor medication adherence, poor inhaler technique, or progression of COPD or comorbid conditions.  CAT MCID is 2 points  mMRC: mMRC (Modified Medical Research Council) Dyspnea Scale is used to assess the degree of baseline functional disability in patients of respiratory disease due to dyspnea. No minimal important difference is established. A decrease in score of 1 point or greater is considered a positive change.   Pulmonary Function Assessment:  Pulmonary Function Assessment - 11/16/23 0949       Pulmonary Function Tests   FVC% 66 %    FEV1% 56 %    FEV1/FVC Ratio 64    RV% 123 %    DLCO% 75 %          Exercise Target Goals: Exercise Program Goal: Individual exercise prescription set using results from initial 6 min walk test and THRR while considering  patient's activity barriers and safety.   Exercise Prescription Goal: Initial exercise prescription builds to 30-45 minutes a day of aerobic activity, 2-3 days per week.  Home exercise guidelines will be given to patient during program as part of exercise prescription that the participant will acknowledge.  Education: Aerobic Exercise: - Group verbal and visual presentation on the components of exercise prescription. Introduces F.I.T.T principle from ACSM for exercise prescriptions.  Reviews F.I.T.T. principles of aerobic exercise including progression. Written material provided at class time.   Education: Resistance Exercise: - Group verbal and visual presentation on the components of exercise prescription. Introduces F.I.T.T principle from ACSM for exercise prescriptions  Reviews F.I.T.T. principles of resistance exercise including progression. Written material provided at class time.    Education: Exercise & Equipment Safety: - Individual verbal  instruction and demonstration of equipment use and safety with use of the equipment.   Education: Exercise Physiology & General Exercise Guidelines: - Group verbal and written instruction with models to review the exercise physiology of the cardiovascular system and associated critical values. Provides general exercise guidelines with specific guidelines to those with heart or lung disease.  Flowsheet Row PULMONARY REHAB CHRONIC OBSTRUCTIVE PULMONARY DISEASE from 12/16/2023 in [facility]ARDIAC REH[facility]  Date 12/16/23  Educator jh  Instruction Review Code 1- Verbalizes Understanding    Education: Flexibility, Balance, Mind/Body Relaxation: - Group verbal and visual presentation with interactive activity on the components of exercise prescription. Introduces F.I.T.T principle from ACSM for exercise prescriptions.  Reviews F.I.T.T. principles of flexibility and balance exercise training including progression. Also discusses the mind body connection.  Reviews various relaxation techniques to help reduce and manage stress (i.e. Deep breathing, progressive muscle relaxation, and visualization). Balance handout provided to take home. Written material provided at class time.   Activity Barriers & Risk Stratification:  Activity Barriers & Cardiac Risk Stratification - 11/16/23 0943       Activity Barriers & Cardiac Risk Stratification   Activity Barriers Fibromyalgia;History of Falls;Deconditioning;Muscular Weakness;Shortness of Breath          6 Minute Walk:  6 Minute Walk     Row Name 11/18/23 1011         6 Minute Walk   Phase Initial     Distance 1105 feet     Walk Time 6 minutes     # of Rest Breaks 0     MPH 2.09     METS 2.07     RPE 15     Perceived Dyspnea  4     VO2 Peak 7.24     Symptoms Yes (comment)     Comments SOB, back pain/weakness 8/10     Resting HR 70 bpm     Resting BP 100/60     Resting Oxygen Saturation  92 %     Exercise Oxygen Saturation  during  6 min walk 88 %     Max Ex. HR 83 bpm     Max Ex. BP 148/74     2 Minute Post BP 124/66       Interval HR   1 Minute HR 78     2 Minute HR 83     3 Minute HR 82     4 Minute HR 83     5 Minute HR 83     6 Minute HR 80     2 Minute Post HR 66     Interval Heart Rate? Yes       Interval Oxygen   Interval Oxygen? Yes     Baseline Oxygen Saturation % 92 %     1 Minute Oxygen Saturation % 92 %     1 Minute Liters of Oxygen 0 L  Room Air     2 Minute Oxygen Saturation % 91 %     2 Minute Liters of Oxygen 0 L     3 Minute Oxygen Saturation % 92 %     3 Minute Liters of Oxygen 0 L     4 Minute Oxygen Saturation % 92 %     4 Minute Liters of Oxygen 0 L     5 Minute Oxygen Saturation % 92 %     5 Minute Liters of Oxygen 0 L     6 Minute Oxygen Saturation % 88 %     6 Minute Liters of Oxygen 0 L     2 Minute Post Oxygen Saturation % 95 %     2 Minute Post Liters of Oxygen 0 L       Oxygen Initial Assessment:  Oxygen Initial Assessment - 11/16/23 0945       Home Oxygen   Home Oxygen Device None    Sleep Oxygen Prescription None    Home Exercise Oxygen Prescription None    Home Resting Oxygen Prescription None      Intervention   Short Term Goals To learn and understand importance of monitoring SPO2 with pulse oximeter and demonstrate accurate use of the pulse  oximeter.;To learn and understand importance of maintaining oxygen saturations>88%;To learn and demonstrate proper pursed lip breathing techniques or other breathing techniques. ;To learn and demonstrate proper use of respiratory medications    Long  Term Goals Verbalizes importance of monitoring SPO2 with pulse oximeter and return demonstration;Maintenance of O2 saturations>88%;Exhibits proper breathing techniques, such as pursed lip breathing or other method taught during program session;Compliance with respiratory medication;Demonstrates proper use of MDI's          Oxygen Re-Evaluation:  Oxygen Re-Evaluation      Row Name 11/23/23 0803 12/28/23 0832           Program Oxygen Prescription   Program Oxygen Prescription -- None        Home Oxygen   Home Oxygen Device -- None      Sleep Oxygen Prescription -- None      Home Exercise Oxygen Prescription -- None      Home Resting Oxygen Prescription -- None        Goals/Expected Outcomes   Short Term Goals -- To learn and understand importance of monitoring SPO2 with pulse oximeter and demonstrate accurate use of the pulse oximeter.;To learn and understand importance of maintaining oxygen saturations>88%;To learn and demonstrate proper pursed lip breathing techniques or other breathing techniques. ;To learn and demonstrate proper use of respiratory medications      Long  Term Goals -- Verbalizes importance of monitoring SPO2 with pulse oximeter and return demonstration;Maintenance of O2 saturations>88%;Exhibits proper breathing techniques, such as pursed lip breathing or other method taught during program session;Compliance with respiratory medication;Demonstrates proper use of MDI's      Comments Reviewed PLB technique with pt.  Talked about how it works and it's importance in maintaining their exercise saturations.      Short: Become more profiecient at using PLB.   Long: Become independent at using PLB. Royal feels her breathing is about the same since she started  the program, no better or no worse. She wants to continue coming as much as she can to help her breathing. She does use PLB when she feels SOB.      Goals/Expected Outcomes -- Short:Continue to attend rehab. Long: Use PLB when feeling SOB.         Oxygen Discharge (Final Oxygen Re-Evaluation):  Oxygen Re-Evaluation - 12/28/23 0832       Program Oxygen Prescription   Program Oxygen Prescription None      Home Oxygen   Home Oxygen Device None    Sleep Oxygen Prescription None    Home Exercise Oxygen Prescription None    Home Resting Oxygen Prescription None      Goals/Expected  Outcomes   Short Term Goals To learn and understand importance of monitoring SPO2 with pulse oximeter and demonstrate accurate use of the pulse oximeter.;To learn and understand importance of maintaining oxygen saturations>88%;To learn and demonstrate proper pursed lip breathing techniques or other breathing techniques. ;To learn and demonstrate proper use of respiratory medications    Long  Term Goals Verbalizes importance of monitoring SPO2 with pulse oximeter and return demonstration;Maintenance of O2 saturations>88%;Exhibits proper breathing techniques, such as pursed lip breathing or other method taught during program session;Compliance with respiratory medication;Demonstrates proper use of MDI's    Comments Kameah feels her breathing is about the same since she started  the program, no better or no worse. She wants to continue coming as much as she can to help her breathing. She does use PLB when she feels SOB.  Goals/Expected Outcomes Short:Continue to attend rehab. Long: Use PLB when feeling SOB.          Initial Exercise Prescription:  Initial Exercise Prescription - 11/18/23 1000       Date of Initial Exercise RX and Referring Provider   Date 11/18/23    Referring Provider Assaker, Darrin MD      Oxygen   Maintain Oxygen Saturation 88% or higher      NuStep   Level 1    SPM 80    Minutes 15    METs 2      Track   Laps 15    Minutes 15    METs 2      Prescription Details   Frequency (times per week) 2    Duration Progress to 30 minutes of continuous aerobic without signs/symptoms of physical distress      Intensity   THRR 40-80% of Max Heartrate 102-134    Ratings of Perceived Exertion 11-13    Perceived Dyspnea 0-4      Progression   Progression Continue to progress workloads to maintain intensity without signs/symptoms of physical distress.      Resistance Training   Training Prescription Yes    Weight 3/2 lb    Reps 10-15          Perform  Capillary Blood Glucose checks as needed.  Exercise Prescription Changes:   Exercise Prescription Changes     Row Name 11/18/23 1000 11/25/23 1500           Response to Exercise   Blood Pressure (Admit) 100/60 128/70      Blood Pressure (Exercise) 148/74 126/64      Blood Pressure (Exit) 124/66 142/70      Heart Rate (Admit) 70 bpm 77 bpm      Heart Rate (Exercise) 83 bpm 66 bpm      Heart Rate (Exit) 65 bpm 60 bpm      Oxygen Saturation (Admit) 92 % 93 %      Oxygen Saturation (Exercise) 88 % 92 %      Oxygen Saturation (Exit) 94 % 94 %      Rating of Perceived Exertion (Exercise) 15 12      Perceived Dyspnea (Exercise) 4 3      Symptoms SOB, back pain/weakness (8/10) --      Comments walk test results --      Duration -- Continue with 30 min of aerobic exercise without signs/symptoms of physical distress.      Intensity -- THRR unchanged        Progression   Progression -- Continue to progress workloads to maintain intensity without signs/symptoms of physical distress.        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 First full day of exercise!  Patient was oriented to gym and equipment including functions, settings, policies, and procedures.  Patient's individual exercise prescription and treatment plan were reviewed.  All starting workloads were established based on the results of the 6 minute walk test done at initial orientation visit.  The plan for exercise progression was also introduced and progression will be customized based on patient's  performance and goals.         Exercise Goals and Review:   Exercise Goals     Row Name 11/16/23 0944             Exercise Goals   Increase Physical Activity Yes       Intervention Provide advice, education, support and counseling about physical activity/exercise needs.;Develop an individualized exercise prescription for aerobic and resistive training based on initial evaluation findings, risk stratification, comorbidities and participant's personal goals.       Expected Outcomes Short Term: Attend rehab on a regular basis to increase amount of physical activity.;Long Term: Add in home exercise to make exercise part of routine and to increase amount of physical activity.;Long Term: Exercising regularly at least 3-5 days a week.       Increase Strength and Stamina Yes       Intervention Provide advice, education, support and counseling about physical activity/exercise needs.;Develop an individualized exercise prescription for aerobic and resistive training based on initial evaluation findings, risk stratification, comorbidities and participant's personal goals.       Expected Outcomes Short Term: Increase workloads from initial exercise prescription for resistance, speed, and METs.;Short Term: Perform resistance training exercises routinely during rehab and add in resistance training at home;Long Term: Improve cardiorespiratory fitness, muscular endurance and strength as measured by increased METs and functional capacity ( )       Able to understand and use rate of perceived exertion (RPE) scale Yes       Intervention Provide education and explanation on how to use RPE scale       Expected Outcomes Short Term: Able to use RPE daily in rehab to express subjective intensity level;Long Term:  Able to use RPE to guide intensity level when exercising independently       Able to understand and use Dyspnea scale Yes       Intervention Provide education and explanation on how to use Dyspnea scale        Expected Outcomes Short Term: Able to use Dyspnea scale daily in rehab to express subjective sense of shortness of breath during exertion;Long Term: Able to use Dyspnea scale to guide intensity level when exercising independently       Knowledge and understanding of Target Heart Rate Range (THRR) Yes       Intervention Provide education and explanation of THRR including how the numbers were predicted and where they are located for reference       Expected Outcomes Short Term: Able to state/look up THRR;Short Term: Able to use daily as guideline for intensity in rehab;Long Term: Able to use THRR to govern intensity when exercising independently       Able to check pulse independently Yes       Intervention Provide education and demonstration on how to check pulse in carotid and radial arteries.;Review the importance of being able to check your own pulse for safety during independent exercise       Expected Outcomes  Short Term: Able to explain why pulse checking is important during independent exercise;Long Term: Able to check pulse independently and accurately       Understanding of Exercise Prescription Yes       Intervention Provide education, explanation, and written materials on patient's individual exercise prescription       Expected Outcomes Long Term: Able to explain home exercise prescription to exercise independently;Short Term: Able to explain program exercise prescription          Exercise Goals Re-Evaluation :  Exercise Goals Re-Evaluation     Row Name 11/23/23 0804 11/26/23 0820 12/28/23 0831         Exercise Goal Re-Evaluation   Exercise Goals Review -- Increase Physical Activity;Increase Strength and Stamina;Understanding of Exercise Prescription Increase Physical Activity;Increase Strength and Stamina;Able to understand and use rate of perceived exertion (RPE) scale;Able to understand and use Dyspnea scale;Able to check pulse independently;Knowledge and understanding of  Target Heart Rate Range (THRR);Understanding of Exercise Prescription     Comments Reviewed RPE and dyspnea scale, THR and program prescription with pt today.  Pt voiced understanding and was given a copy of goals to take home.       Short: Use RPE daily to regulate intensity.  Long: Follow program prescription in THR. Gennifer has just started CR. She did feel sore after her first day. She is on the nustep and the arm ergometer both on level 1. Will continue to monitor and progress as able. Erla is doing well in rehab. She is increasing her levels here on the equipment when she can. She states her ADL's are her source of exercise at home. 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     Expected Outcomes -- Short: increase level when able   long: continue to attend CR Short: Continue to attend rehab. Long: Incorporate more home exercise.        Discharge Exercise Prescription (Final Exercise Prescription Changes):  Exercise Prescription Changes - 11/25/23 1500       Response to Exercise   Blood Pressure (Admit) 128/70    Blood Pressure (Exercise) 126/64    Blood Pressure (Exit) 142/70    Heart Rate (Admit) 77 bpm    Heart Rate (Exercise) 66 bpm    Heart Rate (Exit) 60 bpm    Oxygen Saturation (Admit) 93 %    Oxygen Saturation (Exercise) 92 %    Oxygen Saturation (Exit) 94 %    Rating of Perceived Exertion (Exercise) 12    Perceived Dyspnea (Exercise) 3    Duration Continue with 30 min of aerobic exercise without signs/symptoms of physical distress.    Intensity THRR unchanged      Progression   Progression Continue to progress workloads to maintain intensity without signs/symptoms of physical distress.      Resistance Training   Training Prescription Yes    Weight 3/2    Reps 10-15      NuStep   Level 1    SPM 54    Minutes 15    METs 1.7      Arm Ergometer   Level 1    Watts 38    Minutes 15    METs 1.6          Nutrition:  Target Goals:  Understanding of nutrition guidelines, daily intake of sodium 1500mg , cholesterol 200mg , calories 30% from fat and 7% or less from saturated fats, daily to have 5 or more servings of  fruits and vegetables.  Education: Nutrition 1 -Group instruction provided by verbal, written material, interactive activities, discussions, models, and posters to present general guidelines for heart healthy nutrition including macronutrients, label reading, and promoting whole foods over processed counterparts. Education serves as pensions consultant of discussion of heart healthy eating for all. Written material provided at class time.     Education: Nutrition 2 -Group instruction provided by verbal, written material, interactive activities, discussions, models, and posters to present general guidelines for heart healthy nutrition including sodium, cholesterol, and saturated fat. Providing guidance of habit forming to improve blood pressure, cholesterol, and body weight. Written material provided at class time.     Biometrics:  Pre Biometrics - 11/18/23 1017       Pre Biometrics   Height 5' (1.524 m)    Weight 172 lb 12.8 oz (78.4 kg)    Waist Circumference 38 inches    Hip Circumference 44 inches    Waist to Hip Ratio 0.86 %    BMI (Calculated) 33.75    Grip Strength 19 kg    Single Leg Stand 1.9 seconds           Nutrition Therapy Plan and Nutrition Goals:  Nutrition Therapy & Goals - 11/16/23 0950       Intervention Plan   Intervention Prescribe, educate and counsel regarding individualized specific dietary modifications aiming towards targeted core components such as weight, hypertension, lipid management, diabetes, heart failure and other comorbidities.;Nutrition handout(s) given to patient.    Expected Outcomes Short Term Goal: Understand basic principles of dietary content, such as calories, fat, sodium, cholesterol and nutrients.;Short Term Goal: A plan has been developed with personal nutrition  goals set during dietitian appointment.;Long Term Goal: Adherence to prescribed nutrition plan.          Nutrition Assessments:  Nutrition Assessments - 11/16/23 0959       Rate Your Plate Scores   Pre Score 47         MEDIFICTS Score Key: >=70 Need to make dietary changes  40-70 Heart Healthy Diet <= 40 Therapeutic Level Cholesterol Diet  Flowsheet Row PULMONARY VIRTUAL BASED CARE from 11/16/2023 in [facility] CARDIAC REH[facility]ILITATION  Picture Your Plate Total Score on Admission 47   Picture Your Plate Scores: <59 Unhealthy dietary pattern with much room for improvement. 41-50 Dietary pattern unlikely to meet recommendations for good health and room for improvement. 51-60 More healthful dietary pattern, with some room for improvement.  >60 Healthy dietary pattern, although there may be some specific behaviors that could be improved.   Nutrition Goals Re-Evaluation:  Nutrition Goals Re-Evaluation     Row Name 12/28/23 0828             Goals   Current Weight 175 lb (79.4 kg)       Nutrition Goal Healthy eating       Comment Milani states her diet is going so so. She has her good and bad days. She states she is trying to eat healthy and she is drinking enough fluids. She carries a big jug with her to work to sip on water all day.       Expected Outcome Short: Increase fluid intake. Long: Try to incorporate more fresh fruits and veggies.          Nutrition Goals Discharge (Final Nutrition Goals Re-Evaluation):  Nutrition Goals Re-Evaluation - 12/28/23 0828       Goals   Current Weight 175 lb (79.4 kg)    Nutrition Goal  Healthy eating    Comment Claudean states her diet is going so so. She has her good and bad days. She states she is trying to eat healthy and she is drinking enough fluids. She carries a big jug with her to work to sip on water all day.    Expected Outcome Short: Increase fluid intake. Long: Try to incorporate more fresh fruits and veggies.           Psychosocial: Target Goals: Acknowledge presence or absence of significant depression and/or stress, maximize coping skills, provide positive support system. Participant is able to verbalize types and ability to use techniques and skills needed for reducing stress and depression.   Education: Stress, Anxiety, and Depression - Group verbal and visual presentation to define topics covered.  Reviews how body is impacted by stress, anxiety, and depression.  Also discusses healthy ways to reduce stress and to treat/manage anxiety and depression.  Written material provided at class time.   Education: Sleep Hygiene -Provides group verbal and written instruction about how sleep can affect your health.  Define sleep hygiene, discuss sleep cycles and impact of sleep habits. Review good sleep hygiene tips.    Initial Review & Psychosocial Screening:  Initial Psych Review & Screening - 11/16/23 0950       Initial Review   Current issues with Current Stress Concerns;Current Psychotropic Meds    Source of Stress Concerns Chronic Illness;Occupation    Comments Elton states she is stressed a lot of the time, sometimes she feels like she makes her own stress. Has a history of anxiety.      Family Dynamics   Good Support System? Yes    Comments Robert lives alone with her dog, she says if she had an emergency to call her son who is on her contact list.      Barriers   Psychosocial barriers to participate in program There are no identifiable barriers or psychosocial needs.      Screening Interventions   Interventions Encouraged to exercise    Expected Outcomes Short Term goal: Utilizing psychosocial counselor, staff and physician to assist with identification of specific Stressors or current issues interfering with healing process. Setting desired goal for each stressor or current issue identified.;Long Term Goal: Stressors or current issues are controlled or eliminated.;Short Term goal:  Identification and review with participant of any Quality of Life or Depression concerns found by scoring the questionnaire.;Long Term goal: The participant improves quality of Life and PHQ9 Scores as seen by post scores and/or verbalization of changes          Quality of Life Scores:  Scores of 19 and below usually indicate a poorer quality of life in these areas.  A difference of  2-3 points is a clinically meaningful difference.  A difference of 2-3 points in the total score of the Quality of Life Index has been associated with significant improvement in overall quality of life, self-image, physical symptoms, and general health in studies assessing change in quality of life.  PHQ-9: Review Flowsheet       11/18/2023 06/18/2020  Depression screen PHQ 2/9  Decreased Interest 1 0  Down, Depressed, Hopeless 1 0  PHQ - 2 Score 2 0  Altered sleeping 1 0  Tired, decreased energy 1 3  Change in appetite 1 0  Feeling bad or failure about yourself  1 0  Trouble concentrating 0 0  Moving slowly or fidgety/restless 0 0  Suicidal thoughts 0 0  PHQ-9 Score  6 3   Interpretation of Total Score  Total Score Depression Severity:  1-4 = Minimal depression, 5-9 = Mild depression, 10-14 = Moderate depression, 15-19 = Moderately severe depression, 20-27 = Severe depression   Psychosocial Evaluation and Intervention:  Psychosocial Evaluation - 11/16/23 0952       Psychosocial Evaluation & Interventions   Interventions Stress management education;Relaxation education;Encouraged to exercise with the program and follow exercise prescription    Comments Jadaya is a pleasant 68 year old female who is coming into rehab for her COPD. She lives home alone with her dog. 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 She also walks some at home. She states she gets around ok, is a little wobbly at times, uses no assistive device. She has had a fall in the last year where she fell  into her rose bushes at home. She wears no oxygen, but is supposed to wear a CPAP at night but when she moved here from [place]  she lost it and didn't feel like getting another one.    Expected Outcomes Short: Increase strength and stamina. Long: Improve shortness of breath.    Continue Psychosocial Services  Follow up required by staff          Psychosocial Re-Evaluation:  Psychosocial Re-Evaluation     Row Name 12/28/23 0827             Psychosocial Re-Evaluation   Current issues with None Identified       Comments Ardell currently doesn't identify any stressors in her life at the moment. She is sleeping well as well.       Expected Outcomes Short: Continue to attend rehab. Long: Continue to have healthy stress outlets.       Interventions Encouraged to attend Pulmonary Rehabilitation for the exercise       Continue Psychosocial Services  Follow up required by staff          Psychosocial Discharge (Final Psychosocial Re-Evaluation):  Psychosocial Re-Evaluation - 12/28/23 0827       Psychosocial Re-Evaluation   Current issues with None Identified    Comments Rema currently doesn't identify any stressors in her life at the moment. She is sleeping well as well.    Expected Outcomes Short: Continue to attend rehab. Long: Continue to have healthy stress outlets.    Interventions Encouraged to attend Pulmonary Rehabilitation for the exercise    Continue Psychosocial Services  Follow up required by staff          Education: Education Goals: Education classes will be provided on a weekly basis, covering required topics. Participant will state understanding/return demonstration of topics presented.  Learning Barriers/Preferences:  Learning Barriers/Preferences - 11/16/23 0945       Learning Barriers/Preferences   Learning Barriers Sight   wears glasses   Learning Preferences None          General Pulmonary Education Topics:  Infection Prevention: - Provides  verbal and written material to individual with discussion of infection control including proper hand washing and proper equipment cleaning during exercise session.   Falls Prevention: - Provides verbal and written material to individual with discussion of falls prevention and safety.   Chronic Lung Disease Review: - Group verbal instruction with posters, models, PowerPoint presentations and videos,  to review new updates, new respiratory medications, new advancements in procedures and treatments. Providing information on websites and 800 numbers for continued self-education. Includes information about supplement oxygen, available portable  oxygen systems, continuous and intermittent flow rates, oxygen safety, concentrators, and Medicare reimbursement for oxygen. Explanation of Pulmonary Drugs, including class, frequency, complications, importance of spacers, rinsing mouth after steroid MDI's, and proper cleaning methods for nebulizers. Review of basic lung anatomy and physiology related to function, structure, and complications of lung disease. Review of risk factors. Discussion about methods for diagnosing sleep apnea and types of masks and machines for OSA. Includes a review of the use of types of environmental controls: home humidity, furnaces, filters, dust mite/pet prevention, HEPA vacuums. Discussion about weather changes, air quality and the benefits of nasal washing. Instruction on Warning signs, infection symptoms, calling MD promptly, preventive modes, and value of vaccinations. Review of effective airway clearance, coughing and/or vibration techniques. Emphasizing that all should Create an Action Plan. Written material provided at class time. Flowsheet Row PULMONARY REHAB CHRONIC OBSTRUCTIVE PULMONARY DISEASE from 12/16/2023 in [facility]  Date 12/02/23  Educator HB  Instruction Review Code 1- Verbalizes Understanding    AED/CPR: - Group verbal and written  instruction with the use of models to demonstrate the basic use of the AED with the basic ABC's of resuscitation.    Tests and Procedures:  - Group verbal and visual presentation and models provide information about basic cardiac anatomy and function. Reviews the testing methods done to diagnose heart disease and the outcomes of the test results. Describes the treatment choices: Medical Management, Angioplasty, or Coronary Bypass Surgery for treating various heart conditions including Myocardial Infarction, Angina, Valve Disease, and Cardiac Arrhythmias.  Written material provided at class time. Flowsheet Row PULMONARY REHAB CHRONIC OBSTRUCTIVE PULMONARY DISEASE from 12/16/2023 in [facility]  Date 12/09/23  Educator jh  Instruction Review Code 2- Demonstrated Understanding    Medication Safety: - Group verbal and visual instruction to review commonly prescribed medications for heart and lung disease. Reviews the medication, class of the drug, and side effects. Includes the steps to properly store meds and maintain the prescription regimen.  Written material given at graduation. Flowsheet Row PULMONARY REHAB CHRONIC OBSTRUCTIVE PULMONARY DISEASE from 12/16/2023 in [facility]  Date 11/25/23  Educator [facility]  Instruction Review Code 1- Verbalizes Understanding    Other: -Provides group and verbal instruction on various topics (see comments)   Knowledge Questionnaire Score:  Knowledge Questionnaire Score - 11/16/23 0957       Knowledge Questionnaire Score   Pre Score 14/18           Core Components/Risk Factors/Patient Goals at Admission:  Personal Goals and Risk Factors at Admission - 11/18/23 1017       Core Components/Risk Factors/Patient Goals on Admission    Weight Management Yes;Obesity;Weight Loss    Intervention Weight Management: Develop a combined nutrition and exercise program designed to reach desired caloric intake, while  maintaining appropriate intake of nutrient and fiber, sodium and fats, and appropriate energy expenditure required for the weight goal.;Weight Management: Provide education and appropriate resources to help participant work on and attain dietary goals.;Weight Management/Obesity: Establish reasonable short term and long term weight goals.;Obesity: Provide education and appropriate resources to help participant work on and attain dietary goals.    Admit Weight 172 lb 12.8 oz (78.4 kg)    Goal Weight: Short Term 170 lb (77.1 kg)    Goal Weight: Long Term 165 lb (74.8 kg)    Expected Outcomes Short Term: Continue to assess and modify interventions until short term weight is achieved;Long Term: Adherence to nutrition and physical activity/exercise  program aimed toward attainment of established weight goal;Weight Loss: Understanding of general recommendations for a balanced deficit meal plan, which promotes 1-2 lb weight loss per week and includes a negative energy balance of [phone] kcal/d;Understanding recommendations for meals to include 15-35% energy as protein, 25-35% energy from fat, 35-60% energy from carbohydrates, less than 200mg  of dietary cholesterol, 20-35 gm of total fiber daily;Understanding of distribution of calorie intake throughout the day with the consumption of 4-5 meals/snacks    Improve shortness of breath with ADL's Yes    Intervention Provide education, individualized exercise plan and daily activity instruction to help decrease symptoms of SOB with activities of daily living.    Expected Outcomes Short Term: Improve cardiorespiratory fitness to achieve a reduction of symptoms when performing ADLs;Long Term: Be able to perform more ADLs without symptoms or delay the onset of symptoms    Increase knowledge of respiratory medications and ability to use respiratory devices properly  Yes    Intervention Provide education and demonstration as needed of appropriate use of medications, inhalers,  and oxygen therapy.    Expected Outcomes Short Term: Achieves understanding of medications use. Understands that oxygen is a medication prescribed by physician. Demonstrates appropriate use of inhaler and oxygen therapy.;Long Term: Maintain appropriate use of medications, inhalers, and oxygen therapy.    Stress Yes    Intervention Offer individual and/or small group education and counseling on adjustment to heart disease, stress management and health-related lifestyle change. Teach and support self-help strategies.;Refer participants experiencing significant psychosocial distress to appropriate mental health specialists for further evaluation and treatment. When possible, include family members and significant others in education/counseling sessions.    Expected Outcomes Short Term: Participant demonstrates changes in health-related behavior, relaxation and other stress management skills, ability to obtain effective social support, and compliance with psychotropic medications if prescribed.;Long Term: Emotional wellbeing is indicated by absence of clinically significant psychosocial distress or social isolation.          Education:Diabetes - Individual verbal and written instruction to review signs/symptoms of diabetes, desired ranges of glucose level fasting, after meals and with exercise. Acknowledge that pre and post exercise glucose checks will be done for 3 sessions at entry of program.   Know Your Numbers and Heart Failure: - Group verbal and visual instruction to discuss disease risk factors for cardiac and pulmonary disease and treatment options.  Reviews associated critical values for Overweight/Obesity, Hypertension, Cholesterol, and Diabetes.  Discusses basics of heart failure: signs/symptoms and treatments.  Introduces Heart Failure Zone chart for action plan for heart failure. Written material provided at class time.   Core Components/Risk Factors/Patient Goals Review:   Goals and  Risk Factor Review     Row Name 12/28/23 0829             Core Components/Risk Factors/Patient Goals Review   Personal Goals Review Improve shortness of breath with ADL's;Weight Management/Obesity;Develop more efficient breathing techniques such as purse lipped breathing and diaphragmatic breathing and practicing self-pacing with activity.       Review Remmington is doing well in rehab. She checks her O2 at home and she states it stays consistent around 90-92%. She takes all her medicines as prescribed as well.       Expected Outcomes Short: Continue to attend rehab. Long: Continue checking O2 at home and report any abnormalities.          Core Components/Risk Factors/Patient Goals at Discharge (Final Review):   Goals and Risk Factor Review - 12/28/23 9170  Core Components/Risk Factors/Patient Goals Review   Personal Goals Review Improve shortness of breath with ADL's;Weight Management/Obesity;Develop more efficient breathing techniques such as purse lipped breathing and diaphragmatic breathing and practicing self-pacing with activity.    Review Joanne is doing well in rehab. She checks her O2 at home and she states it stays consistent around 90-92%. She takes all her medicines as prescribed as well.    Expected Outcomes Short: Continue to attend rehab. Long: Continue checking O2 at home and report any abnormalities.          ITP Comments:  ITP Comments     Row Name 11/16/23 0949 11/18/23 0930 11/23/23 0801 12/30/23 0750     ITP Comments Completed virtual orientation today.  EP evaluation is scheduled for 9/24 at 0830 .  Documentation for diagnosis can be found in [facility] encounter 10/29/23. Patient arrived for 1st visit/orientation/education at 0830. Patient was referred to PR by Dr. Malka due to COPD. During orientation advised patient on arrival and appointment times what to wear, what to do before, during and after exercise. Reviewed attendance and class policy.  Pt is scheduled  to return Pulmonary Rehab on 11/23/23 at 745. Pt was advised to come to class 15 minutes before class starts.  Discussed RPE/Dpysnea scales. Patient participated in warm up stretches. Patient was able to complete 6 minute walk test. Patient was measured for the equipment. Discussed equipment safety with patient. Took patient pre-anthropometric measurements. Patient finished visit at 945. First full day of exercise!  Patient was oriented to gym and equipment including functions, settings, policies, and procedures.  Patient's individual exercise prescription and treatment plan were reviewed.  All starting workloads were established based on the results of the 6 minute walk test done at initial orientation visit.  The plan for exercise progression was also introduced and progression will be customized based on patient's performance and goals. 30 day review completed. ITP sent to Dr.Jehanzeb Memon, Medical Director of  Pulmonary Rehab. Continue with ITP unless changes are made by physician.       Comments: 30 day review

## 2023-12-30 NOTE — Progress Notes (Signed)
 Daily Session Note  Patient Details  Name: Dawn Hughes MRN: [id number] Date of Birth: August 02, 1955 Referring Provider:   Flowsheet Row PULMONARY REHAB COPD ORIENTATION from 11/18/2023 in [facility]  Referring Provider Malka Domino MD    Encounter Date: 12/30/2023  Check In:  Session Check In - 12/30/23 0759       Check-In   Supervising physician immediately available to respond to emergencies See telemetry face sheet for immediately available MD    Location [facility]    Staff Present Powell Benders, BS, Exercise Physiologist;Debra Vicci, RN, BSN;Keyan Folson Zina, RN    Virtual Visit No    Medication changes reported     No    Fall or balance concerns reported    No    Warm-up and Cool-down Performed on first and last piece of equipment    Resistance Training Performed Yes    VAD Patient? No    PAD/SET Patient? No      Pain Assessment   Currently in Pain? No/denies          Capillary Blood Glucose: No results found for this or any previous visit (from the past 24 hours).    Social History   Tobacco Use  Smoking Status Former   Current packs/day: 0.00   Average packs/day: 1 pack/day for 52.0 years (52.0 ttl pk-yrs)   Types: Cigarettes   Start date: 1   Quit date: 2021   Years since quitting: 4.8   Passive exposure: Never  Smokeless Tobacco Never    Goals Met:  Proper associated with RPD/PD & O2 Sat Independence with exercise equipment Using PLB without cueing & demonstrates good technique Exercise tolerated well No report of concerns or symptoms today Strength training completed today  Goals Unmet:  Not Applicable  Comments: Pt able to follow exercise prescription today without complaint.  Will continue to monitor for progression.

## 2024-01-04 ENCOUNTER — Encounter (HOSPITAL_COMMUNITY)

## 2024-01-04 ENCOUNTER — Encounter (HOSPITAL_COMMUNITY)
Admission: RE | Admit: 2024-01-04 | Discharge: 2024-01-04 | Disposition: A | Source: Ambulatory Visit | Attending: Pulmonary Disease

## 2024-01-04 DIAGNOSIS — J449 Chronic obstructive pulmonary disease, unspecified: Secondary | ICD-10-CM | POA: Diagnosis not present

## 2024-01-04 NOTE — Progress Notes (Signed)
 Daily Session Note  Patient Details  Name: Dawn Hughes MRN: [id number] Date of Birth: 14-Feb-1956 Referring Provider:   Flowsheet Row PULMONARY REHAB COPD ORIENTATION from 11/18/2023 in [facility]  Referring Provider Malka Domino MD    Encounter Date: 01/04/2024  Check In:  Session Check In - 01/04/24 0755       Check-In   Supervising physician immediately available to respond to emergencies See telemetry face sheet for immediately available MD    Location [facility]    Staff Present Powell Benders, BS, Exercise Physiologist;Marzelle Rutten Vonzell, MA, RCEP, CCRP, CCET;Brittany Jackquline, BSN, RN, WTA-C    Virtual Visit No    Medication changes reported     No    Fall or balance concerns reported    No    Warm-up and Cool-down Performed on first and last piece of equipment    Resistance Training Performed Yes    VAD Patient? No    PAD/SET Patient? No      Pain Assessment   Currently in Pain? No/denies          Capillary Blood Glucose: No results found for this or any previous visit (from the past 24 hours).    Social History   Tobacco Use  Smoking Status Former   Current packs/day: 0.00   Average packs/day: 1 pack/day for 52.0 years (52.0 ttl pk-yrs)   Types: Cigarettes   Start date: 57   Quit date: 2021   Years since quitting: 4.8   Passive exposure: Never  Smokeless Tobacco Never    Goals Met:  Proper associated with RPD/PD & O2 Sat Independence with exercise equipment Using PLB without cueing & demonstrates good technique Exercise tolerated well No report of concerns or symptoms today Strength training completed today  Goals Unmet:  Not Applicable  Comments: Pt able to follow exercise prescription today without complaint.  Will continue to monitor for progression.

## 2024-01-06 ENCOUNTER — Encounter (HOSPITAL_COMMUNITY)
Admission: RE | Admit: 2024-01-06 | Discharge: 2024-01-06 | Disposition: A | Discharge status: Home / Self Care | Source: Ambulatory Visit | Attending: Pulmonary Disease | Admitting: Pulmonary Disease

## 2024-01-06 ENCOUNTER — Encounter (HOSPITAL_COMMUNITY)

## 2024-01-06 DIAGNOSIS — J449 Chronic obstructive pulmonary disease, unspecified: Secondary | ICD-10-CM

## 2024-01-06 NOTE — Progress Notes (Signed)
 Daily Session Note  Patient Details  Name: ROLA LENNON MRN: [id number] Date of Birth: Jul 23, 1955 Referring Provider:   Flowsheet Row PULMONARY REHAB COPD ORIENTATION from 11/18/2023 in [facility]  Referring Provider Malka Domino MD    Encounter Date: 01/06/2024  Check In:  Session Check In - 01/06/24 0752       Check-In   Supervising physician immediately available to respond to emergencies See telemetry face sheet for immediately available MD    Location [facility]    Staff Present Powell Benders, BS, Exercise Physiologist;Jessica Vonzell, MA, RCEP, CCRP, CCET;Belen Pesch Jackquline, BSN, RN, WTA-C    Virtual Visit No    Medication changes reported     No    Fall or balance concerns reported    No    Tobacco Cessation No Change    Warm-up and Cool-down Performed on first and last piece of equipment    Resistance Training Performed Yes    VAD Patient? No    PAD/SET Patient? No      Pain Assessment   Currently in Pain? No/denies          Capillary Blood Glucose: No results found for this or any previous visit (from the past 24 hours).    Social History   Tobacco Use  Smoking Status Former   Current packs/day: 0.00   Average packs/day: 1 pack/day for 52.0 years (52.0 ttl pk-yrs)   Types: Cigarettes   Start date: 75   Quit date: 2021   Years since quitting: 4.8   Passive exposure: Never  Smokeless Tobacco Never    Goals Met:  Proper associated with RPD/PD & O2 Sat Independence with exercise equipment Improved SOB with ADL's Using PLB without cueing & demonstrates good technique Exercise tolerated well No report of concerns or symptoms today Strength training completed today  Goals Unmet:  Not Applicable  Comments: Pt able to follow exercise prescription today without complaint.  Will continue to monitor for progression.

## 2024-01-11 ENCOUNTER — Encounter (HOSPITAL_COMMUNITY)

## 2024-01-13 ENCOUNTER — Encounter (HOSPITAL_COMMUNITY)

## 2024-01-13 ENCOUNTER — Encounter (HOSPITAL_COMMUNITY)
Admission: RE | Admit: 2024-01-13 | Discharge: 2024-01-13 | Disposition: A | Discharge status: Home / Self Care | Source: Ambulatory Visit | Attending: Pulmonary Disease | Admitting: Pulmonary Disease

## 2024-01-13 DIAGNOSIS — J449 Chronic obstructive pulmonary disease, unspecified: Secondary | ICD-10-CM

## 2024-01-13 NOTE — Progress Notes (Signed)
 Daily Session Note  Patient Details  Name: Dawn Hughes MRN: [id number] Date of Birth: 1955-06-25 Referring Provider:   Flowsheet Row PULMONARY REHAB COPD ORIENTATION from 11/18/2023 in [facility] CARDIAC REHABILITATION  Referring Provider Malka Domino MD    Encounter Date: 01/13/2024  Check In:  Session Check In - 01/13/24 0756       Check-In   Supervising physician immediately available to respond to emergencies See telemetry face sheet for immediately available MD    Location [facility]    Staff Present Powell Benders, BS, Exercise Physiologist;Blakely Gluth Vonzell, MA, RCEP, CCRP, CCET;Brittany Jackquline, BSN, RN, WTA-C    Virtual Visit No    Medication changes reported     No    Fall or balance concerns reported    No    Warm-up and Cool-down Performed on first and last piece of equipment    Resistance Training Performed Yes    VAD Patient? No    PAD/SET Patient? No      Pain Assessment   Currently in Pain? No/denies          Capillary Blood Glucose: No results found for this or any previous visit (from the past 24 hours).    Social History   Tobacco Use  Smoking Status Former   Current packs/day: 0.00   Average packs/day: 1 pack/day for 52.0 years (52.0 ttl pk-yrs)   Types: Cigarettes   Start date: 67   Quit date: 2021   Years since quitting: 4.8   Passive exposure: Never  Smokeless Tobacco Never    Goals Met:  Proper associated with RPD/PD & O2 Sat Independence with exercise equipment Using PLB without cueing & demonstrates good technique Exercise tolerated well Personal goals reviewed No report of concerns or symptoms today Strength training completed today  Goals Unmet:  Not Applicable  Comments: Pt able to follow exercise prescription today without complaint.  Will continue to monitor for progression.

## 2024-01-18 ENCOUNTER — Encounter (HOSPITAL_COMMUNITY)

## 2024-01-20 ENCOUNTER — Ambulatory Visit (HOSPITAL_COMMUNITY)

## 2024-01-20 ENCOUNTER — Encounter (HOSPITAL_COMMUNITY)

## 2024-01-25 ENCOUNTER — Encounter (HOSPITAL_COMMUNITY)

## 2024-01-26 ENCOUNTER — Encounter (HOSPITAL_COMMUNITY): Payer: Self-pay | Admitting: *Deleted

## 2024-01-26 DIAGNOSIS — J449 Chronic obstructive pulmonary disease, unspecified: Secondary | ICD-10-CM

## 2024-01-26 NOTE — Progress Notes (Signed)
 Pulmonary Individual Treatment Plan  Patient Details  Name: Dawn Hughes MRN: [id number] Date of Birth: 02-23-1956 Referring Provider:   Flowsheet Row PULMONARY REHAB COPD ORIENTATION from 11/18/2023 in [facility] CARDIAC REHABILITATION  Referring Provider Assaker, Darrin MD    Initial Encounter Date:  Flowsheet Row PULMONARY REHAB COPD ORIENTATION from 11/18/2023 in [place] [facility] CARDIAC REHABILITATION  Date 11/18/23    Visit Diagnosis: Stage 2 moderate COPD by GOLD classification (HCC)  Patient's Home Medications on Admission:   Current Outpatient Medications:    albuterol  (VENTOLIN  HFA) 108 (90 Base) MCG/ACT inhaler, Inhale into the lungs every 6 (six) hours as needed for wheezing or shortness of breath., Disp: , Rfl:    alendronate (FOSAMAX) 70 MG tablet, Take 70 mg by mouth once a week., Disp: , Rfl:    Cyanocobalamin (VITAMIN B12) 1000 MCG TBCR, 1 tablet Orally Once a day; Duration: 30 day(s) (Patient not taking: Reported on 11/16/2023), Disp: , Rfl:    D3-50 1.25 MG (50000 UT) capsule, Take 50,000 Units by mouth once a week., Disp: , Rfl:    DULoxetine  (CYMBALTA ) 60 MG capsule, Take 60 mg by mouth 2 (two) times daily., Disp: , Rfl:    fluticasone-salmeterol (ADVAIR) 250-50 MCG/ACT AEPB, 1 puff 2 (two) times daily as needed., Disp: , Rfl:    Fluticasone-Umeclidin-Vilant (TRELEGY ELLIPTA ) 100-62.5-25 MCG/ACT AEPB, Inhale 1 puff into the lungs daily., Disp: 3 each, Rfl: 3   hydroxychloroquine  (PLAQUENIL ) 200 MG tablet, Take 1 tablet (200 mg total) by mouth daily., Disp: 90 tablet, Rfl: 0   levothyroxine  (SYNTHROID ) 100 MCG tablet, Take 100 mcg by mouth daily., Disp: , Rfl:    metolazone (ZAROXOLYN) 5 MG tablet, TAKE 1 TABLET BY MOUTH EVERY DAY; Duration: 90, Disp: , Rfl:    metoprolol succinate (TOPROL-XL) 25 MG 24 hr tablet, 1 tablet Orally Once a day; Duration: 90 days, Disp: , Rfl:    morphine  (MS CONTIN ) 30 MG 12 hr tablet, Take 30 mg by mouth every 12 (twelve) hours., Disp:  , Rfl:    Multiple Vitamin (MULTIVITAMIN PO), Take by mouth., Disp: , Rfl:    Omega 3 1000 MG CAPS, 1 capsule., Disp: , Rfl:    pantoprazole  (PROTONIX ) 40 MG tablet, Take 40 mg by mouth daily., Disp: , Rfl:    pregabalin  (LYRICA ) 300 MG capsule, Take 300 mg by mouth 2 (two) times daily., Disp: , Rfl:    tiZANidine  (ZANAFLEX ) 4 MG tablet, Take 4 mg by mouth 3 (three) times daily as needed., Disp: , Rfl:   Past Medical History: Past Medical History:  Diagnosis Date   COPD (chronic obstructive pulmonary disease) (HCC)    Fibromyalgia    GAD (generalized anxiety disorder)    GERD (gastroesophageal reflux disease)    Hypertension    Hypothyroidism    Lung nodules    Other intervertebral disc degeneration, lumbar region    Sleep apnea     Tobacco Use: Social History   Tobacco Use  Smoking Status Former   Current packs/day: 0.00   Average packs/day: 1 pack/day for 52.0 years (52.0 ttl pk-yrs)   Types: Cigarettes   Start date: 42   Quit date: 2021   Years since quitting: 4.9   Passive exposure: Never  Smokeless Tobacco Never    Labs: Review Flowsheet        No data to display          Capillary Blood Glucose: No results found for: GLUCAP   Pulmonary Assessment Scores:  Pulmonary  Assessment Scores     Row Name 11/16/23 0957         ADL UCSD   ADL Phase Entry     SOB Score total 42     Rest 2     Walk 3     Stairs 5     Bath 3     Dress 1     Shop 1       CAT Score   CAT Score 17       UCSD: Self-administered rating of dyspnea associated with activities of daily living (ADLs) 6-point scale (0 = not at all to 5 = maximal or unable to do because of breathlessness)  Scoring Scores range from 0 to 120.  Minimally important difference is 5 units  CAT: CAT can identify the health impairment of COPD patients and is better correlated with disease progression.  CAT has a scoring range of zero to 40. The CAT score is classified into four groups of low  (less than 10), medium (10 - 20), high (21-30) and very high (31-40) based on the impact level of disease on health status. A CAT score over 10 suggests significant symptoms.  A worsening CAT score could be explained by an exacerbation, poor medication adherence, poor inhaler technique, or progression of COPD or comorbid conditions.  CAT MCID is 2 points  mMRC: mMRC (Modified Medical Research Council) Dyspnea Scale is used to assess the degree of baseline functional disability in patients of respiratory disease due to dyspnea. No minimal important difference is established. A decrease in score of 1 point or greater is considered a positive change.   Pulmonary Function Assessment:  Pulmonary Function Assessment - 11/16/23 0949       Pulmonary Function Tests   FVC% 66 %    FEV1% 56 %    FEV1/FVC Ratio 64    RV% 123 %    DLCO% 75 %          Exercise Target Goals: Exercise Program Goal: Individual exercise prescription set using results from initial 6 min walk test and THRR while considering  patient's activity barriers and safety.   Exercise Prescription Goal: Initial exercise prescription builds to 30-45 minutes a day of aerobic activity, 2-3 days per week.  Home exercise guidelines will be given to patient during program as part of exercise prescription that the participant will acknowledge.  Activity Barriers & Risk Stratification:  Activity Barriers & Cardiac Risk Stratification - 11/16/23 0943       Activity Barriers & Cardiac Risk Stratification   Activity Barriers Fibromyalgia;History of Falls;Deconditioning;Muscular Weakness;Shortness of Breath          6 Minute Walk:  6 Minute Walk     Row Name 11/18/23 1011         6 Minute Walk   Phase Initial     Distance 1105 feet     Walk Time 6 minutes     # of Rest Breaks 0     MPH 2.09     METS 2.07     RPE 15     Perceived Dyspnea  4     VO2 Peak 7.24     Symptoms Yes (comment)     Comments SOB, back  pain/weakness 8/10     Resting HR 70 bpm     Resting BP 100/60     Resting Oxygen Saturation  92 %     Exercise Oxygen Saturation  during 6 min walk 88 %  Max Ex. HR 83 bpm     Max Ex. BP 148/74     2 Minute Post BP 124/66       Interval HR   1 Minute HR 78     2 Minute HR 83     3 Minute HR 82     4 Minute HR 83     5 Minute HR 83     6 Minute HR 80     2 Minute Post HR 66     Interval Heart Rate? Yes       Interval Oxygen   Interval Oxygen? Yes     Baseline Oxygen Saturation % 92 %     1 Minute Oxygen Saturation % 92 %     1 Minute Liters of Oxygen 0 L  Room Air     2 Minute Oxygen Saturation % 91 %     2 Minute Liters of Oxygen 0 L     3 Minute Oxygen Saturation % 92 %     3 Minute Liters of Oxygen 0 L     4 Minute Oxygen Saturation % 92 %     4 Minute Liters of Oxygen 0 L     5 Minute Oxygen Saturation % 92 %     5 Minute Liters of Oxygen 0 L     6 Minute Oxygen Saturation % 88 %     6 Minute Liters of Oxygen 0 L     2 Minute Post Oxygen Saturation % 95 %     2 Minute Post Liters of Oxygen 0 L        Oxygen Initial Assessment:  Oxygen Initial Assessment - 11/16/23 0945       Home Oxygen   Home Oxygen Device None    Sleep Oxygen Prescription None    Home Exercise Oxygen Prescription None    Home Resting Oxygen Prescription None      Intervention   Short Term Goals To learn and understand importance of monitoring SPO2 with pulse oximeter and demonstrate accurate use of the pulse oximeter.;To learn and understand importance of maintaining oxygen saturations>88%;To learn and demonstrate proper pursed lip breathing techniques or other breathing techniques. ;To learn and demonstrate proper use of respiratory medications    Long  Term Goals Verbalizes importance of monitoring SPO2 with pulse oximeter and return demonstration;Maintenance of O2 saturations>88%;Exhibits proper breathing techniques, such as pursed lip breathing or other method taught during program  session;Compliance with respiratory medication;Demonstrates proper use of MDI's          Oxygen Re-Evaluation:  Oxygen Re-Evaluation     Row Name 11/23/23 0803 12/28/23 0832 01/13/24 0808         Program Oxygen Prescription   Program Oxygen Prescription -- None None       Home Oxygen   Home Oxygen Device -- None None     Sleep Oxygen Prescription -- None None     Home Exercise Oxygen Prescription -- None None     Home Resting Oxygen Prescription -- None None       Goals/Expected Outcomes   Short Term Goals -- To learn and understand importance of monitoring SPO2 with pulse oximeter and demonstrate accurate use of the pulse oximeter.;To learn and understand importance of maintaining oxygen saturations>88%;To learn and demonstrate proper pursed lip breathing techniques or other breathing techniques. ;To learn and demonstrate proper use of respiratory medications To learn and understand importance of monitoring SPO2 with pulse oximeter and demonstrate  accurate use of the pulse oximeter.;To learn and understand importance of maintaining oxygen saturations>88%;To learn and demonstrate proper pursed lip breathing techniques or other breathing techniques. ;To learn and demonstrate proper use of respiratory medications     Long  Term Goals -- Verbalizes importance of monitoring SPO2 with pulse oximeter and return demonstration;Maintenance of O2 saturations>88%;Exhibits proper breathing techniques, such as pursed lip breathing or other method taught during program session;Compliance with respiratory medication;Demonstrates proper use of MDI's Verbalizes importance of monitoring SPO2 with pulse oximeter and return demonstration;Maintenance of O2 saturations>88%;Exhibits proper breathing techniques, such as pursed lip breathing or other method taught during program session;Compliance with respiratory medication;Demonstrates proper use of MDI's     Comments Reviewed PLB technique with pt.  Talked about  how it works and it's importance in maintaining their exercise saturations.      Short: Become more profiecient at using PLB.   Long: Become independent at using PLB. Dawn Hughes feels her breathing is about the same since she started  the program, no better or no worse. She wants to continue coming as much as she can to help her breathing. She does use PLB when she feels SOB. Dawn Hughes feels her breathing has improved since starting the program. She states she can climb stairs easier now. She says her O2 sats are running good at home as well.     Goals/Expected Outcomes -- Short:Continue to attend rehab. Long: Use PLB when feeling SOB. Short: Continue to attend rehab. Long: Continue checking O2 sats at home.        Oxygen Discharge (Final Oxygen Re-Evaluation):  Oxygen Re-Evaluation - 01/13/24 0808       Program Oxygen Prescription   Program Oxygen Prescription None      Home Oxygen   Home Oxygen Device None    Sleep Oxygen Prescription None    Home Exercise Oxygen Prescription None    Home Resting Oxygen Prescription None      Goals/Expected Outcomes   Short Term Goals To learn and understand importance of monitoring SPO2 with pulse oximeter and demonstrate accurate use of the pulse oximeter.;To learn and understand importance of maintaining oxygen saturations>88%;To learn and demonstrate proper pursed lip breathing techniques or other breathing techniques. ;To learn and demonstrate proper use of respiratory medications    Long  Term Goals Verbalizes importance of monitoring SPO2 with pulse oximeter and return demonstration;Maintenance of O2 saturations>88%;Exhibits proper breathing techniques, such as pursed lip breathing or other method taught during program session;Compliance with respiratory medication;Demonstrates proper use of MDI's    Comments Dawn Hughes feels her breathing has improved since starting the program. She states she can climb stairs easier now. She says her O2 sats are running good  at home as well.    Goals/Expected Outcomes Short: Continue to attend rehab. Long: Continue checking O2 sats at home.          Initial Exercise Prescription:  Initial Exercise Prescription - 11/18/23 1000       Date of Initial Exercise RX and Referring Provider   Date 11/18/23    Referring Provider Assaker, Darrin MD      Oxygen   Maintain Oxygen Saturation 88% or higher      NuStep   Level 1    SPM 80    Minutes 15    METs 2      Track   Laps 15    Minutes 15    METs 2      Prescription Details   Frequency (times  per week) 2    Duration Progress to 30 minutes of continuous aerobic without signs/symptoms of physical distress      Intensity   THRR 40-80% of Max Heartrate 102-134    Ratings of Perceived Exertion 11-13    Perceived Dyspnea 0-4      Progression   Progression Continue to progress workloads to maintain intensity without signs/symptoms of physical distress.      Resistance Training   Training Prescription Yes    Weight 3/2 lb    Reps 10-15          Perform Capillary Blood Glucose checks as needed.  Exercise Prescription Changes:   Exercise Prescription Changes     Row Name 11/18/23 1000 11/25/23 1500 12/30/23 0800         Response to Exercise   Blood Pressure (Admit) 100/60 128/70 126/64     Blood Pressure (Exercise) 148/74 126/64 --     Blood Pressure (Exit) 124/66 142/70 118/60     Heart Rate (Admit) 70 bpm 77 bpm 74 bpm     Heart Rate (Exercise) 83 bpm 66 bpm 88 bpm     Heart Rate (Exit) 65 bpm 60 bpm 78 bpm     Oxygen Saturation (Admit) 92 % 93 % 90 %     Oxygen Saturation (Exercise) 88 % 92 % 94 %     Oxygen Saturation (Exit) 94 % 94 % 91 %     Rating of Perceived Exertion (Exercise) 15 12 12      Perceived Dyspnea (Exercise) 4 3 1      Symptoms SOB, back pain/weakness (8/10) -- --     Comments walk test results -- --     Duration -- Continue with 30 min of aerobic exercise without signs/symptoms of physical distress.  Continue with 30 min of aerobic exercise without signs/symptoms of physical distress.     Intensity -- THRR unchanged THRR unchanged       Progression   Progression -- Continue to progress workloads to maintain intensity without signs/symptoms of physical distress. Continue to progress workloads to maintain intensity without signs/symptoms of physical distress.       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 First full day of exercise!  Patient was oriented to gym and equipment including functions, settings, policies, and procedures.  Patient's individual exercise prescription and treatment plan were reviewed.  All starting workloads were established based on the results of the 6 minute walk test done at initial orientation visit.  The plan for exercise progression was also introduced and progression will be customized based on patient's performance and goals.         Exercise Goals  and Review:   Exercise Goals     Row Name 11/16/23 0944             Exercise Goals   Increase Physical Activity Yes       Intervention Provide advice, education, support and counseling about physical activity/exercise needs.;Develop an individualized exercise prescription for aerobic and resistive training based on initial evaluation findings, risk stratification, comorbidities and participant's personal goals.       Expected Outcomes Short Term: Attend rehab on a regular basis to increase amount of physical activity.;Long Term: Add in home exercise to make  exercise part of routine and to increase amount of physical activity.;Long Term: Exercising regularly at least 3-5 days a week.       Increase Strength and Stamina Yes       Intervention Provide advice, education, support and counseling about physical activity/exercise needs.;Develop an individualized exercise prescription for aerobic and resistive training based on initial evaluation findings, risk stratification, comorbidities and participant's personal goals.       Expected Outcomes Short Term: Increase workloads from initial exercise prescription for resistance, speed, and METs.;Short Term: Perform resistance training exercises routinely during rehab and add in resistance training at home;Long Term: Improve cardiorespiratory fitness, muscular endurance and strength as measured by increased METs and functional capacity ( )       Able to understand and use rate of perceived exertion (RPE) scale Yes       Intervention Provide education and explanation on how to use RPE scale       Expected Outcomes Short Term: Able to use RPE daily in rehab to express subjective intensity level;Long Term:  Able to use RPE to guide intensity level when exercising independently       Able to understand and use Dyspnea scale Yes       Intervention Provide education and explanation on how to use Dyspnea scale       Expected Outcomes Short Term: Able to use Dyspnea scale daily in rehab to express subjective sense of shortness of breath during exertion;Long Term: Able to use Dyspnea scale to guide intensity level when exercising independently       Knowledge and understanding of Target Heart Rate Range (THRR) Yes       Intervention Provide education and explanation of THRR including how the numbers were predicted and where they are located for reference       Expected Outcomes Short Term: Able to state/look up THRR;Short Term: Able to use daily as guideline for intensity in rehab;Long Term: Able to use THRR to govern  intensity when exercising independently       Able to check pulse independently Yes       Intervention Provide education and demonstration on how to check pulse in carotid and radial arteries.;Review the importance of being able to check your own pulse for safety during independent exercise       Expected Outcomes Short Term: Able to explain why pulse checking is important during independent exercise;Long Term: Able to check pulse independently and accurately       Understanding of Exercise Prescription Yes       Intervention Provide education, explanation, and written materials on patient's individual exercise prescription       Expected Outcomes Long Term: Able to explain home exercise prescription to exercise independently;Short Term: Able to explain program exercise prescription          Exercise Goals Re-Evaluation :  Exercise Goals Re-Evaluation     Row  Name 11/23/23 0804 11/26/23 0820 12/28/23 0831 01/13/24 0807       Exercise Goal Re-Evaluation   Exercise Goals Review -- Increase Physical Activity;Increase Strength and Stamina;Understanding of Exercise Prescription Increase Physical Activity;Increase Strength and Stamina;Able to understand and use rate of perceived exertion (RPE) scale;Able to understand and use Dyspnea scale;Able to check pulse independently;Knowledge and understanding of Target Heart Rate Range (THRR);Understanding of Exercise Prescription Increase Strength and Stamina;Increase Physical Activity;Understanding of Exercise Prescription    Comments Reviewed RPE and dyspnea scale, THR and program prescription with pt today.  Pt voiced understanding and was given a copy of goals to take home.       Short: Use RPE daily to regulate intensity.  Long: Follow program prescription in THR. Deneshia has just started CR. She did feel sore after her first day. She is on the nustep and the arm ergometer both on level 1. Will continue to monitor and progress as able. Dawn Hughes is doing well  in rehab. She is increasing her levels here on the equipment when she can. She states her ADL's are her source of exercise at home. 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 Dawn Hughes is doing well in rehab. 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 She also is trying Tai Chi now at home, and so far she states she likes it. She is increasing her levels on the Nustep and arm erogmeter as tolerated.    Expected Outcomes -- Short: increase level when able   long: continue to attend CR Short: Continue to attend rehab. Long: Incorporate more home exercise. Short: Continue to attend rehab. Long: Incorporate more home exercise.       Discharge Exercise Prescription (Final Exercise Prescription Changes):  Exercise Prescription Changes - 12/30/23 0800       Response to Exercise   Blood Pressure (Admit) 126/64    Blood Pressure (Exit) 118/60    Heart Rate (Admit) 74 bpm    Heart Rate (Exercise) 88 bpm    Heart Rate (Exit) 78 bpm    Oxygen Saturation (Admit) 90 %    Oxygen Saturation (Exercise) 94 %    Oxygen Saturation (Exit) 91 %    Rating of Perceived Exertion (Exercise) 12    Perceived Dyspnea (Exercise) 1    Duration Continue with 30 min of aerobic exercise without signs/symptoms of physical distress.    Intensity THRR unchanged      Progression   Progression Continue to progress workloads to maintain intensity without signs/symptoms of physical distress.      Resistance Training   Training Prescription Yes    Weight 2    Reps 10-15      NuStep   Level 2    SPM 77    Minutes 15    METs 1.9      Arm Ergometer   Level 2    Watts 35    Minutes 15    METs 1.5          Nutrition:  Target Goals: Understanding of nutrition guidelines, daily intake of sodium 1500mg , cholesterol 200mg , calories 30% from fat and 7% or less from saturated fats, daily to have 5 or more servings of fruits and vegetables.  Biometrics:  Pre Biometrics - 11/18/23  1017       Pre Biometrics   Height 5' (1.524 m)    Weight 172 lb 12.8 oz (78.4 kg)    Waist Circumference  38 inches    Hip Circumference 44 inches    Waist to Hip Ratio 0.86 %    BMI (Calculated) 33.75    Grip Strength 19 kg    Single Leg Stand 1.9 seconds           Nutrition Therapy Plan and Nutrition Goals:  Nutrition Therapy & Goals - 11/16/23 0950       Intervention Plan   Intervention Prescribe, educate and counsel regarding individualized specific dietary modifications aiming towards targeted core components such as weight, hypertension, lipid management, diabetes, heart failure and other comorbidities.;Nutrition handout(s) given to patient.    Expected Outcomes Short Term Goal: Understand basic principles of dietary content, such as calories, fat, sodium, cholesterol and nutrients.;Short Term Goal: A plan has been developed with personal nutrition goals set during dietitian appointment.;Long Term Goal: Adherence to prescribed nutrition plan.          Nutrition Assessments:  Nutrition Assessments - 11/16/23 0959       Rate Your Plate Scores   Pre Score 47         MEDIFICTS Score Key: >=70 Need to make dietary changes  40-70 Heart Healthy Diet <= 40 Therapeutic Level Cholesterol Diet  Flowsheet Row PULMONARY VIRTUAL BASED CARE from 11/16/2023 in [facility] CARDIAC REHABILITATION  Picture Your Plate Total Score on Admission 47   Picture Your Plate Scores: <59 Unhealthy dietary pattern with much room for improvement. 41-50 Dietary pattern unlikely to meet recommendations for good health and room for improvement. 51-60 More healthful dietary pattern, with some room for improvement.  >60 Healthy dietary pattern, although there may be some specific behaviors that could be improved.    Nutrition Goals Re-Evaluation:  Nutrition Goals Re-Evaluation     Row Name 12/28/23 0828 01/13/24 0806           Goals   Current Weight 175 lb (79.4 kg) --      Nutrition  Goal Healthy eating Healthy eating      Comment Dawn Hughes states her diet is going so so. She has her good and bad days. She states she is trying to eat healthy and she is drinking enough fluids. She carries a big jug with her to work to sip on water all day. Dawn Hughes states her diet is going ok. She has her good and bad days. Encouraged her to still have things she enjoys like sweets, but just in moderation. She states she is drinking enough water, as she takes a big jug around with her most days.      Expected Outcome Short: Increase fluid intake. Long: Try to incorporate more fresh fruits and veggies. Short: Increase fluid intake. Long: Try to incorporate more fresh fruits and veggies and limit sweets.         Nutrition Goals Discharge (Final Nutrition Goals Re-Evaluation):  Nutrition Goals Re-Evaluation - 01/13/24 0806       Goals   Nutrition Goal Healthy eating    Comment Dawn Hughes states her diet is going ok. She has her good and bad days. Encouraged her to still have things she enjoys like sweets, but just in moderation. She states she is drinking enough water, as she takes a big jug around with her most days.    Expected Outcome Short: Increase fluid intake. Long: Try to incorporate more fresh fruits and veggies and limit sweets.          Psychosocial: Target Goals: Acknowledge presence or absence of significant depression and/or stress, maximize coping skills,  provide positive support system. Participant is able to verbalize types and ability to use techniques and skills needed for reducing stress and depression.  Initial Review & Psychosocial Screening:  Initial Psych Review & Screening - 11/16/23 0950       Initial Review   Current issues with Current Stress Concerns;Current Psychotropic Meds    Source of Stress Concerns Chronic Illness;Occupation    Comments Dawn Hughes states she is stressed a lot of the time, sometimes she feels like she makes her own stress. Has a history of  anxiety.      Family Dynamics   Good Support System? Yes    Comments Dawn Hughes lives alone with her dog, she says if she had an emergency to call her son who is on her contact list.      Barriers   Psychosocial barriers to participate in program There are no identifiable barriers or psychosocial needs.      Screening Interventions   Interventions Encouraged to exercise    Expected Outcomes Short Term goal: Utilizing psychosocial counselor, staff and physician to assist with identification of specific Stressors or current issues interfering with healing process. Setting desired goal for each stressor or current issue identified.;Long Term Goal: Stressors or current issues are controlled or eliminated.;Short Term goal: Identification and review with participant of any Quality of Life or Depression concerns found by scoring the questionnaire.;Long Term goal: The participant improves quality of Life and PHQ9 Scores as seen by post scores and/or verbalization of changes          Quality of Life Scores:  Scores of 19 and below usually indicate a poorer quality of life in these areas.  A difference of  2-3 points is a clinically meaningful difference.  A difference of 2-3 points in the total score of the Quality of Life Index has been associated with significant improvement in overall quality of life, self-image, physical symptoms, and general health in studies assessing change in quality of life.   PHQ-9: Review Flowsheet       11/18/2023 06/18/2020  Depression screen PHQ 2/9  Decreased Interest 1 0  Down, Depressed, Hopeless 1 0  PHQ - 2 Score 2 0  Altered sleeping 1 0  Tired, decreased energy 1 3  Change in appetite 1 0  Feeling bad or failure about yourself  1 0  Trouble concentrating 0 0  Moving slowly or fidgety/restless 0 0  Suicidal thoughts 0 0  PHQ-9 Score 6  3     Details       Data saved with a previous flowsheet row definition        Interpretation of Total Score   Total Score Depression Severity:  1-4 = Minimal depression, 5-9 = Mild depression, 10-14 = Moderate depression, 15-19 = Moderately severe depression, 20-27 = Severe depression   Psychosocial Evaluation and Intervention:  Psychosocial Evaluation - 11/16/23 0952       Psychosocial Evaluation & Interventions   Interventions Stress management education;Relaxation education;Encouraged to exercise with the program and follow exercise prescription    Comments Dawn Hughes is a pleasant 68 year old female who is coming into rehab for her COPD. She lives home alone with her dog. 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 She also walks some at home. She states she gets around ok, is a little wobbly at times, uses no assistive device. She has had a fall in the last year where she fell into her rose  bushes at home. She wears no oxygen, but is supposed to wear a CPAP at night but when she moved here from [place]  she lost it and didn't feel like getting another one.    Expected Outcomes Short: Increase strength and stamina. Long: Improve shortness of breath.    Continue Psychosocial Services  Follow up required by staff          Psychosocial Re-Evaluation:  Psychosocial Re-Evaluation     Row Name 12/28/23 0827 01/13/24 0806           Psychosocial Re-Evaluation   Current issues with None Identified None Identified      Comments Dawn Hughes currently doesn't identify any stressors in her life at the moment. She is sleeping well as well. Dawn Hughes currently doesn't identify any stressors in her life at the moment. She is sleeping well at night.      Expected Outcomes Short: Continue to attend rehab. Long: Continue to have healthy stress outlets. Short: Continue to attend rehab. Long: Continue to have healthy stress outlets.      Interventions Encouraged to attend Pulmonary Rehabilitation for the exercise Encouraged to attend Pulmonary Rehabilitation for the exercise      Continue  Psychosocial Services  Follow up required by staff Follow up required by staff         Psychosocial Discharge (Final Psychosocial Re-Evaluation):  Psychosocial Re-Evaluation - 01/13/24 0806       Psychosocial Re-Evaluation   Current issues with None Identified    Comments Dawn Hughes currently doesn't identify any stressors in her life at the moment. She is sleeping well at night.    Expected Outcomes Short: Continue to attend rehab. Long: Continue to have healthy stress outlets.    Interventions Encouraged to attend Pulmonary Rehabilitation for the exercise    Continue Psychosocial Services  Follow up required by staff           Education: Education Goals: Education classes will be provided on a weekly basis, covering required topics. Participant will state understanding/return demonstration of topics presented.  Learning Barriers/Preferences:  Learning Barriers/Preferences - 11/16/23 0945       Learning Barriers/Preferences   Learning Barriers Sight   wears glasses   Learning Preferences None          Education Topics: How Lungs Work and Diseases: - Discuss the anatomy of the lungs and diseases that can affect the lungs, such as COPD.   Exercise: -Discuss the importance of exercise, FITT principles of exercise, normal and abnormal responses to exercise, and how to exercise safely.   Environmental Irritants: -Discuss types of environmental irritants and how to limit exposure to environmental irritants.   Meds/Inhalers and oxygen: - Discuss respiratory medications, definition of an inhaler and oxygen, and the proper way to use an inhaler and oxygen.   Energy Saving Techniques: - Discuss methods to conserve energy and decrease shortness of breath when performing activities of daily living.    Bronchial Hygiene / Breathing Techniques: - Discuss breathing mechanics, pursed-lip breathing technique,  proper posture, effective ways to clear airways, and other functional  breathing techniques   Cleaning Equipment: - Provides group verbal and written instruction about the health risks of elevated stress, cause of high stress, and healthy ways to reduce stress.   Nutrition I: Fats: - Discuss the types of cholesterol, what cholesterol does to the body, and how cholesterol levels can be controlled.   Nutrition II: Labels: -Discuss the different components of food labels and how to read food labels.  Respiratory Infections: - Discuss the signs and symptoms of respiratory infections, ways to prevent respiratory infections, and the importance of seeking medical treatment when having a respiratory infection.   Stress I: Signs and Symptoms: - Discuss the causes of stress, how stress may lead to anxiety and depression, and ways to limit stress.   Stress II: Relaxation: -Discuss relaxation techniques to limit stress.   Oxygen for Home/Travel: - Discuss how to prepare for travel when on oxygen and proper ways to transport and store oxygen to ensure safety.   Knowledge Questionnaire Score:  Knowledge Questionnaire Score - 11/16/23 0957       Knowledge Questionnaire Score   Pre Score 14/18          Core Components/Risk Factors/Patient Goals at Admission:  Personal Goals and Risk Factors at Admission - 11/18/23 1017       Core Components/Risk Factors/Patient Goals on Admission    Weight Management Yes;Obesity;Weight Loss    Intervention Weight Management: Develop a combined nutrition and exercise program designed to reach desired caloric intake, while maintaining appropriate intake of nutrient and fiber, sodium and fats, and appropriate energy expenditure required for the weight goal.;Weight Management: Provide education and appropriate resources to help participant work on and attain dietary goals.;Weight Management/Obesity: Establish reasonable short term and long term weight goals.;Obesity: Provide education and appropriate resources to help  participant work on and attain dietary goals.    Admit Weight 172 lb 12.8 oz (78.4 kg)    Goal Weight: Short Term 170 lb (77.1 kg)    Goal Weight: Long Term 165 lb (74.8 kg)    Expected Outcomes Short Term: Continue to assess and modify interventions until short term weight is achieved;Long Term: Adherence to nutrition and physical activity/exercise program aimed toward attainment of established weight goal;Weight Loss: Understanding of general recommendations for a balanced deficit meal plan, which promotes 1-2 lb weight loss per week and includes a negative energy balance of [id number] kcal/d;Understanding recommendations for meals to include 15-35% energy as protein, 25-35% energy from fat, 35-60% energy from carbohydrates, less than 200mg  of dietary cholesterol, 20-35 gm of total fiber daily;Understanding of distribution of calorie intake throughout the day with the consumption of 4-5 meals/snacks    Improve shortness of breath with ADL's Yes    Intervention Provide education, individualized exercise plan and daily activity instruction to help decrease symptoms of SOB with activities of daily living.    Expected Outcomes Short Term: Improve cardiorespiratory fitness to achieve a reduction of symptoms when performing ADLs;Long Term: Be able to perform more ADLs without symptoms or delay the onset of symptoms    Increase knowledge of respiratory medications and ability to use respiratory devices properly  Yes    Intervention Provide education and demonstration as needed of appropriate use of medications, inhalers, and oxygen therapy.    Expected Outcomes Short Term: Achieves understanding of medications use. Understands that oxygen is a medication prescribed by physician. Demonstrates appropriate use of inhaler and oxygen therapy.;Long Term: Maintain appropriate use of medications, inhalers, and oxygen therapy.    Stress Yes    Intervention Offer individual and/or small group education and counseling on  adjustment to heart disease, stress management and health-related lifestyle change. Teach and support self-help strategies.;Refer participants experiencing significant psychosocial distress to appropriate mental health specialists for further evaluation and treatment. When possible, include family members and significant others in education/counseling sessions.    Expected Outcomes Short Term: Participant demonstrates changes in health-related behavior, relaxation and other stress management  skills, ability to obtain effective social support, and compliance with psychotropic medications if prescribed.;Long Term: Emotional wellbeing is indicated by absence of clinically significant psychosocial distress or social isolation.          Core Components/Risk Factors/Patient Goals Review:   Goals and Risk Factor Review     Row Name 12/28/23 0829 01/13/24 0807           Core Components/Risk Factors/Patient Goals Review   Personal Goals Review Improve shortness of breath with ADL's;Weight Management/Obesity;Develop more efficient breathing techniques such as purse lipped breathing and diaphragmatic breathing and practicing self-pacing with activity. Improve shortness of breath with ADL's;Weight Management/Obesity;Develop more efficient breathing techniques such as purse lipped breathing and diaphragmatic breathing and practicing self-pacing with activity.      Review Dawn Hughes is doing well in rehab. She checks her O2 at home and she states it stays consistent around 90-92%. She takes all her medicines as prescribed as well. Dawn Hughes is doing well in rehab. She checks her O2 at home and she states it stays consistent around 90-92%. She takes all her medicines as prescribed as well.      Expected Outcomes Short: Continue to attend rehab. Long: Continue checking O2 at home and report any abnormalities. Short: Continue to attend rehab. Long: Continue checking O2 at home and report any abnormalities.          Core Components/Risk Factors/Patient Goals at Discharge (Final Review):   Goals and Risk Factor Review - 01/13/24 0807       Core Components/Risk Factors/Patient Goals Review   Personal Goals Review Improve shortness of breath with ADL's;Weight Management/Obesity;Develop more efficient breathing techniques such as purse lipped breathing and diaphragmatic breathing and practicing self-pacing with activity.    Review Dawn Hughes is doing well in rehab. She checks her O2 at home and she states it stays consistent around 90-92%. She takes all her medicines as prescribed as well.    Expected Outcomes Short: Continue to attend rehab. Long: Continue checking O2 at home and report any abnormalities.          ITP Comments:  ITP Comments     Row Name 11/16/23 0949 11/18/23 0930 11/23/23 0801 12/30/23 0750 01/26/24 0902   ITP Comments Completed virtual orientation today.  EP evaluation is scheduled for 9/24 at 0830 .  Documentation for diagnosis can be found in [facility] encounter 10/29/23. Patient arrived for 1st visit/orientation/education at 0830. Patient was referred to PR by Dr. Malka due to COPD. During orientation advised patient on arrival and appointment times what to wear, what to do before, during and after exercise. Reviewed attendance and class policy.  Pt is scheduled to return Pulmonary Rehab on 11/23/23 at 745. Pt was advised to come to class 15 minutes before class starts.  Discussed RPE/Dpysnea scales. Patient participated in warm up stretches. Patient was able to complete 6 minute walk test. Patient was measured for the equipment. Discussed equipment safety with patient. Took patient pre-anthropometric measurements. Patient finished visit at 945. First full day of exercise!  Patient was oriented to gym and equipment including functions, settings, policies, and procedures.  Patient's individual exercise prescription and treatment plan were reviewed.  All starting workloads were established based  on the results of the 6 minute walk test done at initial orientation visit.  The plan for exercise progression was also introduced and progression will be customized based on patient's performance and goals. 30 day review completed. ITP sent to Dr.Jehanzeb Memon, Medical Director of  Pulmonary Rehab.  Continue with ITP unless changes are made by physician. 30 day review completed. ITP sent to Dr.Jehanzeb Memon, Medical Director of  Pulmonary Rehab. Continue with ITP unless changes are made by physician.      Comments: 30 day review

## 2024-01-27 ENCOUNTER — Encounter (HOSPITAL_COMMUNITY)

## 2024-01-27 ENCOUNTER — Encounter (HOSPITAL_COMMUNITY)
Admission: RE | Admit: 2024-01-27 | Discharge: 2024-01-27 | Disposition: A | Discharge status: Home / Self Care | Source: Ambulatory Visit | Attending: Pulmonary Disease | Admitting: Pulmonary Disease

## 2024-01-27 DIAGNOSIS — J449 Chronic obstructive pulmonary disease, unspecified: Secondary | ICD-10-CM | POA: Diagnosis present

## 2024-01-27 NOTE — Progress Notes (Signed)
 Daily Session Note  Patient Details  Name: Dawn Hughes MRN: [id number] Date of Birth: 12-Oct-1955 Referring Provider:   Flowsheet Row PULMONARY REHAB COPD ORIENTATION from 11/18/2023 in [facility]  Referring Provider Malka Domino MD    Encounter Date: 01/27/2024  Check In:  Session Check In - 01/27/24 0755       Check-In   Supervising physician immediately available to respond to emergencies See telemetry face sheet for immediately available MD    Location [facility]    Staff Present Laymon Rattler, BSN, RN, WTA-C;Heather Con, BS, Exercise Physiologist    Virtual Visit No    Medication changes reported     No    Fall or balance concerns reported    No    Tobacco Cessation No Change    Warm-up and Cool-down Performed on first and last piece of equipment    Resistance Training Performed Yes    VAD Patient? No    PAD/SET Patient? No      Pain Assessment   Currently in Pain? No/denies          Capillary Blood Glucose: No results found for this or any previous visit (from the past 24 hours).    Social History   Tobacco Use  Smoking Status Former   Current packs/day: 0.00   Average packs/day: 1 pack/day for 52.0 years (52.0 ttl pk-yrs)   Types: Cigarettes   Start date: 62   Quit date: 2021   Years since quitting: 4.9   Passive exposure: Never  Smokeless Tobacco Never    Goals Met:  Proper associated with RPD/PD & O2 Sat Independence with exercise equipment Improved SOB with ADL's Using PLB without cueing & demonstrates good technique Exercise tolerated well No report of concerns or symptoms today Strength training completed today  Goals Unmet:  Not Applicable  Comments: Pt able to follow exercise prescription today without complaint.  Will continue to monitor for progression.

## 2024-02-01 ENCOUNTER — Encounter (HOSPITAL_COMMUNITY)

## 2024-02-01 ENCOUNTER — Telehealth (HOSPITAL_COMMUNITY): Payer: Self-pay

## 2024-02-01 NOTE — Telephone Encounter (Signed)
 Called patient regarding missed pulmonary rehab session. Left VM.

## 2024-02-03 ENCOUNTER — Encounter (HOSPITAL_COMMUNITY)

## 2024-02-03 ENCOUNTER — Telehealth (HOSPITAL_COMMUNITY): Payer: Self-pay | Admitting: Surgery

## 2024-02-03 ENCOUNTER — Encounter (HOSPITAL_COMMUNITY): Admission: RE | Admit: 2024-02-03 | Source: Ambulatory Visit

## 2024-02-03 NOTE — Telephone Encounter (Signed)
 I called the pt since she missed her pulmonary rehab session this morning.  Left a VM asking her to return my call.

## 2024-02-08 ENCOUNTER — Telehealth (HOSPITAL_COMMUNITY): Payer: Self-pay

## 2024-02-08 ENCOUNTER — Encounter (HOSPITAL_COMMUNITY)

## 2024-02-08 ENCOUNTER — Ambulatory Visit (HOSPITAL_COMMUNITY)

## 2024-02-08 NOTE — Telephone Encounter (Signed)
°  11/30/2023 08:24 AM EDT by Zina Richerd DASEN, RN  Outgoing Brilynn, Biasi (Self) [phone] (Mobile) Remove  Left Message - Missed class today

## 2024-02-08 NOTE — Telephone Encounter (Signed)
 Called patient about missed appointments to see if she wanted to continue to attend rehab. She stated that she wasn't feeling well lately but plans on being here Friday.

## 2024-02-10 ENCOUNTER — Ambulatory Visit (HOSPITAL_COMMUNITY)

## 2024-02-10 ENCOUNTER — Encounter (HOSPITAL_COMMUNITY)

## 2024-02-15 ENCOUNTER — Encounter (HOSPITAL_COMMUNITY)

## 2024-02-17 ENCOUNTER — Encounter (HOSPITAL_COMMUNITY)

## 2024-02-22 ENCOUNTER — Encounter (HOSPITAL_COMMUNITY)

## 2024-02-22 NOTE — Progress Notes (Signed)
 "  Office Visit Note  Patient: Dawn Hughes             Date of Birth: 12-03-1955           MRN: [id number]             PCP: Shona Norleen PEDLAR, MD Referring: Shona Norleen PEDLAR, MD Visit Date: 03/07/2024 Occupation: Data Unavailable  Subjective:  Left sided lower back pain   History of Present Illness: Dawn Hughes is a 68 y.o. female with history of rheumatoid arthritis.  Patient remains on plaquenil  200 mg 1 tablet by mouth daily.  She is tolerating Plaquenil  without any side effects and has not had any gaps in therapy.  Patient reports for the past 1 week she has been experiencing increased left-sided lower back pain.  Patient states that she woke up with the pain and was unsure if she slept wrong.  Patient denies any injury prior to the onset of symptoms.  She denies any identifiable trigger for the pain.  She has tried using lidocaine  patches for pain relief.  Patient states that she has had some difficulty raising the left leg but denies any weakness or tingling affecting the left lower extremity.  She has been taking morphine , tizanidine , and Lyrica  for symptomatic relief. She has intermittent pain and stiffness affecting both hands but denies any joint swelling.  She experiences trigger fingers in the left thumb, index, middle finger intermittently.    Activities of Daily Living:  Patient reports morning stiffness for 1 hour.   Patient Reports nocturnal pain.  Difficulty dressing/grooming: Denies Difficulty climbing stairs: Reports Difficulty getting out of chair: Reports Difficulty using hands for taps, buttons, cutlery, and/or writing: Reports  Review of Systems  Constitutional:  Positive for fatigue.  HENT:  Positive for mouth sores and mouth dryness.   Eyes:  Negative for dryness.  Respiratory:  Positive for shortness of breath.   Cardiovascular:  Negative for chest pain and palpitations.  Gastrointestinal:  Positive for constipation. Negative for blood in stool and diarrhea.   Endocrine: Positive for increased urination.  Genitourinary:  Negative for involuntary urination.  Musculoskeletal:  Positive for joint pain, gait problem, joint pain, joint swelling, myalgias, muscle weakness, morning stiffness, muscle tenderness and myalgias.  Skin:  Positive for color change, rash, hair loss and sensitivity to sunlight.  Allergic/Immunologic: Negative for susceptible to infections.  Neurological:  Positive for headaches. Negative for dizziness.  Hematological:  Negative for swollen glands.  Psychiatric/Behavioral:  Positive for depressed mood and sleep disturbance. The patient is nervous/anxious.     PMFS History:  Patient Active Problem List   Diagnosis Date Noted   Acute respiratory failure with hypoxia (HCC) 06/16/2023   Lactic acidosis 06/16/2023   Microcytic anemia 06/16/2023   Pulmonary nodule 06/16/2023   Pulmonary mass 06/16/2023   Acquired hypothyroidism 06/16/2023   GERD (gastroesophageal reflux disease) 06/16/2023   COPD (chronic obstructive pulmonary disease) (HCC) 06/16/2023   CAP (community acquired pneumonia) 06/15/2023   Rheumatoid arthritis with rheumatoid factor of multiple sites without organ or systems involvement (HCC) 05/06/2023   Primary osteoarthritis of both hands 05/06/2023   Primary osteoarthritis of left hip 05/06/2023   Chondrocalcinosis 05/06/2023   Chondromalacia of both patellae 05/06/2023   Primary osteoarthritis of both feet 05/06/2023   Fibromyalgia 05/06/2023   Chronic pain syndrome 05/06/2023   Vulvar ulceration 06/18/2020   Itching in the vaginal area 06/18/2020    Past Medical History:  Diagnosis Date   COPD (chronic  obstructive pulmonary disease) (HCC)    Fibromyalgia    GAD (generalized anxiety disorder)    GERD (gastroesophageal reflux disease)    Hypertension    Hypothyroidism    Lung nodules    Other intervertebral disc degeneration, lumbar region    Sleep apnea     Family History  Problem Relation Age of  Onset   Diabetes Mother    Heart disease Mother    Diabetes Father    Breast cancer Paternal Grandmother    Asthma Daughter    Fibroids Daughter    Rheum arthritis Daughter    Cancer Daughter    Asthma Son    Psoriasis Son    Past Surgical History:  Procedure Laterality Date   CARPAL TUNNEL RELEASE Right 08/05/2022   Procedure: RIGHT CARPAL TUNNEL RELEASE;  Surgeon: Murrell Drivers, MD;  Location: [facility];  Service: Orthopedics;  Laterality: Right;   CHOLECYSTECTOMY     DILATION AND CURETTAGE OF UTERUS     LAPAROSCOPIC ASSISTED VAGINAL HYSTERECTOMY     SPINAL CORD STIMULATOR INSERTION     stomach stapling  1980   Social History[1] Social History   Social History Narrative   Not on file     Immunization History  Administered Date(s) Administered   Fluad Quad(high Dose 65+) 10/26/2022     Objective: Vital Signs: BP (!) 146/75 (BP Location: Left Arm, Patient Position: Sitting, Cuff Size: Normal)   Pulse 84   Temp 98 F (36.7 C)   Resp 16   Ht 5' (1.524 m)   Wt 185 lb 6.4 oz (84.1 kg)   BMI 36.21 kg/m    Physical Exam Vitals and nursing note reviewed.  Constitutional:      Appearance: She is well-developed.  HENT:     Head: Normocephalic and atraumatic.  Eyes:     Conjunctiva/sclera: Conjunctivae normal.  Cardiovascular:     Rate and Rhythm: Normal rate and regular rhythm.     Heart sounds: Normal heart sounds.  Pulmonary:     Effort: Pulmonary effort is normal.     Breath sounds: Normal breath sounds.  Abdominal:     General: Bowel sounds are normal.     Palpations: Abdomen is soft.  Musculoskeletal:     Cervical back: Normal range of motion.  Lymphadenopathy:     Cervical: No cervical adenopathy.  Skin:    General: Skin is warm and dry.     Capillary Refill: Capillary refill takes less than 2 seconds.  Neurological:     Mental Status: She is alert and oriented to person, place, and time.  Psychiatric:        Behavior: Behavior  normal.      Musculoskeletal Exam: C-spine has limited ROM with lateral rotation.  No midline spinal tenderness.  Left sided lower back tenderness mainly over the sacrum-not localized over the left SI joint currently.  Painful ROM of the lumbar spine with ROM. Shoulder joints have good ROM.  Elbow joints have good ROM with no tenderness along the joint line.  Wrist joints have good ROM.  CMC, PIP, and DIP thickening consistent with osteoarthritis of both hands.  Hip joints have good ROM with no groin pain. Knee joints have good ROM with no warmth or effusion.  Ankle joints have good ROM.   CDAI Exam: CDAI Score: -- Patient Global: --; Provider Global: -- Swollen: --; Tender: -- Joint Exam 03/07/2024   No joint exam has been documented for this visit   There  is currently no information documented on the homunculus. Go to the Rheumatology activity and complete the homunculus joint exam.  Investigation: No additional findings.  Imaging: XR Pelvis 1-2 Views Result Date: 03/07/2024 Edsel view -no SI joint sclerosis or narrowing was noted.  No acute pathology was noted.  Spinal cord stimulator was noted in the right pelvic region. Impression: Unremarkable x-rays of the SI joints.  XR Lumbar Spine 2-3 Views Result Date: 03/07/2024 Multilevel spondylosis was noted.  Facet joint arthropathy was noted.  Anterior osteophytes were noted.  Vertebral height loss was noted in the thoracic region.  Vascular calcification was noted. Impression: These findings suggestive of multilevel spondylosis and facet joint arthropathy.   Recent Labs: Lab Results  Component Value Date   WBC 6.1 10/06/2023   HGB 15.5 10/06/2023   PLT 167 10/06/2023   NA 142 10/06/2023   K 4.4 10/06/2023   CL 102 10/06/2023   CO2 33 (H) 10/06/2023   GLUCOSE 85 10/06/2023   BUN 14 10/06/2023   CREATININE 0.72 10/06/2023   BILITOT 0.4 10/06/2023   ALKPHOS 65 06/16/2023   AST 23 10/06/2023   ALT 22 10/06/2023   PROT 6.7  10/06/2023   ALBUMIN 3.2 (L) 06/16/2023   CALCIUM 9.5 10/06/2023    Speciality Comments: PLQ eye exam normal  12/07/2023 10-2 Visual Field unreliable, HCT: normal. No Toxicity noted [facility] f/u 6 months. (Okay per Dr. Dolphus to follow up with [facility] is 6 months.)  Procedures:  No procedures performed Allergies: Tylenol  [acetaminophen ] and Penicillin g    Assessment / Plan:     Visit Diagnoses: Rheumatoid arthritis with rheumatoid factor of multiple sites without organ or systems involvement (HCC) - Rheumatoid factor was 27, anti-CCP negative, ESR 36, CRP within normal limits on 03/05/2022: Patient has noticed some increase soreness and stiffness affecting both hands.  No synovitis was noted on examination today.  She is taking Plaquenil  200 mg 1 tablet by mouth daily.  She is tolerating Plaquenil  without any side effects and has not had any gaps in therapy.  No medication changes will be made at this time.  She was advised to notify us  if she develops signs or symptoms of a flare.  She will follow up in 5 months or sooner if needed.   High risk medication use - Plaquenil  200 mg 1 tablet by mouth daily.  CBC and CMP updated on 10/06/23.  Orders for CBC and CMP released today.  PLQ eye exam normal 12/07/2023 10-2 Visual Field unreliable, HCT: normal. No Toxicity noted [facility] f/u 6 months. (Okay per Dr. Dolphus to follow up with [facility] is 6 months.)   - Plan: CBC with Differential/Platelet, Comprehensive metabolic panel with GFR  Primary osteoarthritis of both hands: She has PIP and DIP thickening consistent with osteoarthritis of both hands.  She has been experiencing increased pain and stiffness affecting both hands which he attributes to colder weather temperatures.  She has no synovitis on examination.  She remains on Plaquenil  as prescribed.  Primary osteoarthritis of left hip: Patient presents today with increased  left-sided lower back pain.  She is not experiencing the left groin pain currently.  Chondrocalcinosis - XR 03/05/22: Mild chondromalacia patella and chondrocalcinosis.  Chondromalacia of both patellae: No warmth or effusion noted.  Primary osteoarthritis of both feet: She has good range of motion of both ankle joints with no synovitis.  Positive ANA (antinuclear antibody) - 03/05/22: ANA 1:40 cytoplasmic, 1:320Nuclear,  nucleolar.  dsDNA-, complements within normal limits, Smith antibody negative.  No clinical features of systemic lupus.  Fibromyalgia: Patient continues to experience intermittent myalgias and muscle tenderness due to fibromyalgia.  She remains on Lyrica , Cymbalta , tizanidine  as prescribed.  Bilateral carpal tunnel syndrome: Not currently symptomatic.   Chronic pain syndrome -She remains on Lyrica , Cymbalta , and tizanidine  as prescribed.  She also takes morphine  30 mg every 12 hours for pain relief.  Acute left-sided low back pain with bilateral sciatica - Patient presents today with increased left-sided lower back pain for the past 1 week.  No identifiable trigger.  She woke up with the pain.  She has tried  lidocaine  patches as well as has been taking morphine , Lyrica , and tizanidine  as prescribed for symptomatic relief.  Her symptoms have not been improving.  She is having some difficulty raising the left leg due to pain but has not noticed any weakness or numbness affecting the left lower extremity.  No red flag symptoms currently. She has no midline spinal tenderness.  She has some tenderness on the left side of the lower back in the paraspinal muscles and over the left sacrum.  X-rays of lumbar spine and pelvis updated today.  A referral to Dr. Georgina will be placed today for further evaluation.  Plan: XR Lumbar Spine 2-3 Views, XR Pelvis 1-2 Views  History of osteoporosis - She remains on Fosamax 70 mg 1 tablet by mouth once weekly.  She is also taking vitamin D 50,000 units  once weekly.  Vitamin D deficiency: She is taking vitamin D 50,000 units once weekly.  Other medical conditions are listed as follows:  History of hypothyroidism  History of hyperlipidemia  Essential hypertension: Blood pressure was slightly elevated today in the office was rechecked prior to leaving.  Patient was advised to monitor blood pressure closely and return to PCP if blood pressure remains elevated.  Prediabetes  History of COPD   Orders: Orders Placed This Encounter  Procedures   XR Lumbar Spine 2-3 Views   XR Pelvis 1-2 Views   CBC with Differential/Platelet   Comprehensive metabolic panel with GFR   No orders of the defined types were placed in this encounter.    Follow-Up Instructions: Return in about 5 months (around 08/05/2024) for Rheumatoid arthritis.   Waddell CHRISTELLA Craze, PA-C  Note - This record has been created using Dragon software.  Chart creation errors have been sought, but may not always  have been located. Such creation errors do not reflect on  the standard of medical care.     [1]  Social History Tobacco Use   Smoking status: Former    Current packs/day: 0.00    Average packs/day: 1 pack/day for 52.0 years (52.0 ttl pk-yrs)    Types: Cigarettes    Start date: 56    Quit date: 2021    Years since quitting: 5.0    Passive exposure: Never   Smokeless tobacco: Never  Vaping Use   Vaping status: Former   Devices: 6% nicotine  Substance Use Topics   Alcohol use: Never   Drug use: Never   "

## 2024-02-23 ENCOUNTER — Encounter (HOSPITAL_COMMUNITY): Payer: Self-pay | Admitting: *Deleted

## 2024-02-23 DIAGNOSIS — J449 Chronic obstructive pulmonary disease, unspecified: Secondary | ICD-10-CM

## 2024-02-23 NOTE — Progress Notes (Signed)
 Pulmonary Individual Treatment Plan  Patient Details  Name: Dawn Hughes MRN: [id number] Date of Birth: Mar 29, 1955 Referring Provider:   Flowsheet Row PULMONARY REHAB COPD ORIENTATION from 11/18/2023 in [facility] CARDIAC REHABILITATION  Referring Provider Assaker, Darrin MD    Initial Encounter Date:  Flowsheet Row PULMONARY REHAB COPD ORIENTATION from 11/18/2023 in [facility]  Date 11/18/23    Visit Diagnosis: Stage 2 moderate COPD by GOLD classification (HCC)  Patient's Home Medications on Admission:  Current Medications[1]  Past Medical History: Past Medical History:  Diagnosis Date   COPD (chronic obstructive pulmonary disease) (HCC)    Fibromyalgia    GAD (generalized anxiety disorder)    GERD (gastroesophageal reflux disease)    Hypertension    Hypothyroidism    Lung nodules    Other intervertebral disc degeneration, lumbar region    Sleep apnea     Tobacco Use: Tobacco Use History[2]  Labs: Review Flowsheet        No data to display          Capillary Blood Glucose: No results found for: GLUCAP   Pulmonary Assessment Scores:  Pulmonary Assessment Scores     Row Name 11/16/23 0957         ADL UCSD   ADL Phase Entry     SOB Score total 42     Rest 2     Walk 3     Stairs 5     Bath 3     Dress 1     Shop 1       CAT Score   CAT Score 17       UCSD: Self-administered rating of dyspnea associated with activities of daily living (ADLs) 6-point scale (0 = not at all to 5 = maximal or unable to do because of breathlessness)  Scoring Scores range from 0 to 120.  Minimally important difference is 5 units  CAT: CAT can identify the health impairment of COPD patients and is better correlated with disease progression.  CAT has a scoring range of zero to 40. The CAT score is classified into four groups of low (less than 10), medium (10 - 20), high (21-30) and very high (31-40) based on the impact level of  disease on health status. A CAT score over 10 suggests significant symptoms.  A worsening CAT score could be explained by an exacerbation, poor medication adherence, poor inhaler technique, or progression of COPD or comorbid conditions.  CAT MCID is 2 points  mMRC: mMRC (Modified Medical Research Council) Dyspnea Scale is used to assess the degree of baseline functional disability in patients of respiratory disease due to dyspnea. No minimal important difference is established. A decrease in score of 1 point or greater is considered a positive change.   Pulmonary Function Assessment:  Pulmonary Function Assessment - 11/16/23 0949       Pulmonary Function Tests   FVC% 66 %    FEV1% 56 %    FEV1/FVC Ratio 64    RV% 123 %    DLCO% 75 %          Exercise Target Goals: Exercise Program Goal: Individual exercise prescription set using results from initial 6 min walk test and THRR while considering  patients activity barriers and safety.   Exercise Prescription Goal: Initial exercise prescription builds to 30-45 minutes a day of aerobic activity, 2-3 days per week.  Home exercise guidelines will be given to patient during program as part of exercise  prescription that the participant will acknowledge.  Activity Barriers & Risk Stratification:  Activity Barriers & Cardiac Risk Stratification - 11/16/23 0943       Activity Barriers & Cardiac Risk Stratification   Activity Barriers Fibromyalgia;History of Falls;Deconditioning;Muscular Weakness;Shortness of Breath          6 Minute Walk:  6 Minute Walk     Row Name 11/18/23 1011         6 Minute Walk   Phase Initial     Distance 1105 feet     Walk Time 6 minutes     # of Rest Breaks 0     MPH 2.09     METS 2.07     RPE 15     Perceived Dyspnea  4     VO2 Peak 7.24     Symptoms Yes (comment)     Comments SOB, back pain/weakness 8/10     Resting HR 70 bpm     Resting BP 100/60     Resting Oxygen Saturation  92 %      Exercise Oxygen Saturation  during 6 min walk 88 %     Max Ex. HR 83 bpm     Max Ex. BP 148/74     2 Minute Post BP 124/66       Interval HR   1 Minute HR 78     2 Minute HR 83     3 Minute HR 82     4 Minute HR 83     5 Minute HR 83     6 Minute HR 80     2 Minute Post HR 66     Interval Heart Rate? Yes       Interval Oxygen   Interval Oxygen? Yes     Baseline Oxygen Saturation % 92 %     1 Minute Oxygen Saturation % 92 %     1 Minute Liters of Oxygen 0 L  Room Air     2 Minute Oxygen Saturation % 91 %     2 Minute Liters of Oxygen 0 L     3 Minute Oxygen Saturation % 92 %     3 Minute Liters of Oxygen 0 L     4 Minute Oxygen Saturation % 92 %     4 Minute Liters of Oxygen 0 L     5 Minute Oxygen Saturation % 92 %     5 Minute Liters of Oxygen 0 L     6 Minute Oxygen Saturation % 88 %     6 Minute Liters of Oxygen 0 L     2 Minute Post Oxygen Saturation % 95 %     2 Minute Post Liters of Oxygen 0 L        Oxygen Initial Assessment:  Oxygen Initial Assessment - 11/16/23 0945       Home Oxygen   Home Oxygen Device None    Sleep Oxygen Prescription None    Home Exercise Oxygen Prescription None    Home Resting Oxygen Prescription None      Intervention   Short Term Goals To learn and understand importance of monitoring SPO2 with pulse oximeter and demonstrate accurate use of the pulse oximeter.;To learn and understand importance of maintaining oxygen saturations>88%;To learn and demonstrate proper pursed lip breathing techniques or other breathing techniques. ;To learn and demonstrate proper use of respiratory medications    Long  Term Goals Verbalizes importance of monitoring SPO2  with pulse oximeter and return demonstration;Maintenance of O2 saturations>88%;Exhibits proper breathing techniques, such as pursed lip breathing or other method taught during program session;Compliance with respiratory medication;Demonstrates proper use of MDIs          Oxygen  Re-Evaluation:  Oxygen Re-Evaluation     Row Name 11/23/23 0803 12/28/23 0832 01/13/24 0808         Program Oxygen Prescription   Program Oxygen Prescription -- None None       Home Oxygen   Home Oxygen Device -- None None     Sleep Oxygen Prescription -- None None     Home Exercise Oxygen Prescription -- None None     Home Resting Oxygen Prescription -- None None       Goals/Expected Outcomes   Short Term Goals -- To learn and understand importance of monitoring SPO2 with pulse oximeter and demonstrate accurate use of the pulse oximeter.;To learn and understand importance of maintaining oxygen saturations>88%;To learn and demonstrate proper pursed lip breathing techniques or other breathing techniques. ;To learn and demonstrate proper use of respiratory medications To learn and understand importance of monitoring SPO2 with pulse oximeter and demonstrate accurate use of the pulse oximeter.;To learn and understand importance of maintaining oxygen saturations>88%;To learn and demonstrate proper pursed lip breathing techniques or other breathing techniques. ;To learn and demonstrate proper use of respiratory medications     Long  Term Goals -- Verbalizes importance of monitoring SPO2 with pulse oximeter and return demonstration;Maintenance of O2 saturations>88%;Exhibits proper breathing techniques, such as pursed lip breathing or other method taught during program session;Compliance with respiratory medication;Demonstrates proper use of MDIs Verbalizes importance of monitoring SPO2 with pulse oximeter and return demonstration;Maintenance of O2 saturations>88%;Exhibits proper breathing techniques, such as pursed lip breathing or other method taught during program session;Compliance with respiratory medication;Demonstrates proper use of MDIs     Comments Reviewed PLB technique with pt.  Talked about how it works and it's importance in maintaining their exercise saturations.      Short: Become more  profiecient at using PLB.   Long: Become independent at using PLB. Dawn Hughes feels her breathing is about the same since she started  the program, no better or no worse. She wants to continue coming as much as she can to help her breathing. She does use PLB when she feels SOB. Dawn Hughes feels her breathing has improved since starting the program. She states she can climb stairs easier now. She says her O2 sats are running good at home as well.     Goals/Expected Outcomes -- Short:Continue to attend rehab. Long: Use PLB when feeling SOB. Short: Continue to attend rehab. Long: Continue checking O2 sats at home.        Oxygen Discharge (Final Oxygen Re-Evaluation):  Oxygen Re-Evaluation - 01/13/24 0808       Program Oxygen Prescription   Program Oxygen Prescription None      Home Oxygen   Home Oxygen Device None    Sleep Oxygen Prescription None    Home Exercise Oxygen Prescription None    Home Resting Oxygen Prescription None      Goals/Expected Outcomes   Short Term Goals To learn and understand importance of monitoring SPO2 with pulse oximeter and demonstrate accurate use of the pulse oximeter.;To learn and understand importance of maintaining oxygen saturations>88%;To learn and demonstrate proper pursed lip breathing techniques or other breathing techniques. ;To learn and demonstrate proper use of respiratory medications    Long  Term Goals Verbalizes importance of  monitoring SPO2 with pulse oximeter and return demonstration;Maintenance of O2 saturations>88%;Exhibits proper breathing techniques, such as pursed lip breathing or other method taught during program session;Compliance with respiratory medication;Demonstrates proper use of MDIs    Comments Makeshia feels her breathing has improved since starting the program. She states she can climb stairs easier now. She says her O2 sats are running good at home as well.    Goals/Expected Outcomes Short: Continue to attend rehab. Long: Continue  checking O2 sats at home.          Initial Exercise Prescription:  Initial Exercise Prescription - 11/18/23 1000       Date of Initial Exercise RX and Referring Provider   Date 11/18/23    Referring Provider Assaker, Darrin MD      Oxygen   Maintain Oxygen Saturation 88% or higher      NuStep   Level 1    SPM 80    Minutes 15    METs 2      Track   Laps 15    Minutes 15    METs 2      Prescription Details   Frequency (times per week) 2    Duration Progress to 30 minutes of continuous aerobic without signs/symptoms of physical distress      Intensity   THRR 40-80% of Max Heartrate 102-134    Ratings of Perceived Exertion 11-13    Perceived Dyspnea 0-4      Progression   Progression Continue to progress workloads to maintain intensity without signs/symptoms of physical distress.      Resistance Training   Training Prescription Yes    Weight 3/2 lb    Reps 10-15          Perform Capillary Blood Glucose checks as needed.  Exercise Prescription Changes:   Exercise Prescription Changes     Row Name 11/18/23 1000 11/25/23 1500 12/30/23 0800         Response to Exercise   Blood Pressure (Admit) 100/60 128/70 126/64     Blood Pressure (Exercise) 148/74 126/64 --     Blood Pressure (Exit) 124/66 142/70 118/60     Heart Rate (Admit) 70 bpm 77 bpm 74 bpm     Heart Rate (Exercise) 83 bpm 66 bpm 88 bpm     Heart Rate (Exit) 65 bpm 60 bpm 78 bpm     Oxygen Saturation (Admit) 92 % 93 % 90 %     Oxygen Saturation (Exercise) 88 % 92 % 94 %     Oxygen Saturation (Exit) 94 % 94 % 91 %     Rating of Perceived Exertion (Exercise) 15 12 12      Perceived Dyspnea (Exercise) 4 3 1      Symptoms SOB, back pain/weakness (8/10) -- --     Comments walk test results -- --     Duration -- Continue with 30 min of aerobic exercise without signs/symptoms of physical distress. Continue with 30 min of aerobic exercise without signs/symptoms of physical distress.     Intensity  -- THRR unchanged THRR unchanged       Progression   Progression -- Continue to progress workloads to maintain intensity without signs/symptoms of physical distress. Continue to progress workloads to maintain intensity without signs/symptoms of physical distress.       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 First full day of exercise!  Patient was oriented to gym and equipment including functions, settings, policies, and procedures.  Patient's individual exercise prescription and treatment plan were reviewed.  All starting workloads were established based on the results of the 6 minute walk test done at initial orientation visit.  The plan for exercise progression was also introduced and progression will be customized based on patient's performance and goals.         Exercise Goals and Review:   Exercise Goals     Row Name 11/16/23 0944             Exercise Goals   Increase Physical Activity Yes       Intervention Provide advice, education, support and counseling about physical activity/exercise needs.;Develop an individualized exercise prescription for aerobic and resistive training based on initial evaluation findings, risk stratification, comorbidities and participant's personal goals.       Expected Outcomes Short Term: Attend rehab on a regular basis to increase amount of physical activity.;Long Term: Add in home exercise to make exercise part of routine and to increase amount of physical activity.;Long Term: Exercising regularly at  least 3-5 days a week.       Increase Strength and Stamina Yes       Intervention Provide advice, education, support and counseling about physical activity/exercise needs.;Develop an individualized exercise prescription for aerobic and resistive training based on initial evaluation findings, risk stratification, comorbidities and participant's personal goals.       Expected Outcomes Short Term: Increase workloads from initial exercise prescription for resistance, speed, and METs.;Short Term: Perform resistance training exercises routinely during rehab and add in resistance training at home;Long Term: Improve cardiorespiratory fitness, muscular endurance and strength as measured by increased METs and functional capacity ( )       Able to understand and use rate of perceived exertion (RPE) scale Yes       Intervention Provide education and explanation on how to use RPE scale       Expected Outcomes Short Term: Able to use RPE daily in rehab to express subjective intensity level;Long Term:  Able to use RPE to guide intensity level when exercising independently       Able to understand and use Dyspnea scale Yes       Intervention Provide education and explanation on how to use Dyspnea scale       Expected Outcomes Short Term: Able to use Dyspnea scale daily in rehab to express subjective sense of shortness of breath during exertion;Long Term: Able to use Dyspnea scale to guide intensity level when exercising independently       Knowledge and understanding of Target Heart Rate Range (THRR) Yes       Intervention Provide education and explanation of THRR including how the numbers were predicted  and where they are located for reference       Expected Outcomes Short Term: Able to state/look up THRR;Short Term: Able to use daily as guideline for intensity in rehab;Long Term: Able to use THRR to govern intensity when exercising independently       Able to check pulse independently Yes       Intervention  Provide education and demonstration on how to check pulse in carotid and radial arteries.;Review the importance of being able to check your own pulse for safety during independent exercise       Expected Outcomes Short Term: Able to explain why pulse checking is important during independent exercise;Long Term: Able to check pulse independently and accurately       Understanding of Exercise Prescription Yes       Intervention Provide education, explanation, and written materials on patient's individual exercise prescription       Expected Outcomes Long Term: Able to explain home exercise prescription to exercise independently;Short Term: Able to explain program exercise prescription          Exercise Goals Re-Evaluation :  Exercise Goals Re-Evaluation     Row Name 11/23/23 0804 11/26/23 0820 12/28/23 0831 01/13/24 0807       Exercise Goal Re-Evaluation   Exercise Goals Review -- Increase Physical Activity;Increase Strength and Stamina;Understanding of Exercise Prescription Increase Physical Activity;Increase Strength and Stamina;Able to understand and use rate of perceived exertion (RPE) scale;Able to understand and use Dyspnea scale;Able to check pulse independently;Knowledge and understanding of Target Heart Rate Range (THRR);Understanding of Exercise Prescription Increase Strength and Stamina;Increase Physical Activity;Understanding of Exercise Prescription    Comments Reviewed RPE and dyspnea scale, THR and program prescription with pt today.  Pt voiced understanding and was given a copy of goals to take home.       Short: Use RPE daily to regulate intensity.  Long: Follow program prescription in THR. Dawn Hughes has just started CR. She did feel sore after her first day. She is on the nustep and the arm ergometer both on level 1. Will continue to monitor and progress as able. Dawn Hughes is doing well in rehab. She is increasing her levels here on the equipment when she can. She states her ADL's are her  source of exercise at home. 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 Dawn Hughes is doing well in rehab. 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 She also is trying Tai Chi now at home, and so far she states she likes it. She is increasing her levels on the Nustep and arm erogmeter as tolerated.    Expected Outcomes -- Short: increase level when able   long: continue to attend CR Short: Continue to attend rehab. Long: Incorporate more home exercise. Short: Continue to attend rehab. Long: Incorporate more home exercise.       Discharge Exercise Prescription (Final Exercise Prescription Changes):  Exercise Prescription Changes - 12/30/23 0800       Response to Exercise   Blood Pressure (Admit) 126/64    Blood Pressure (Exit) 118/60    Heart Rate (Admit) 74 bpm    Heart Rate (Exercise) 88 bpm    Heart Rate (Exit) 78 bpm    Oxygen Saturation (Admit) 90 %    Oxygen Saturation (Exercise) 94 %    Oxygen Saturation (Exit) 91 %    Rating of Perceived Exertion (Exercise) 12    Perceived Dyspnea (Exercise) 1  Duration Continue with 30 min of aerobic exercise without signs/symptoms of physical distress.    Intensity THRR unchanged      Progression   Progression Continue to progress workloads to maintain intensity without signs/symptoms of physical distress.      Resistance Training   Training Prescription Yes    Weight 2    Reps 10-15      NuStep   Level 2    SPM 77    Minutes 15    METs 1.9      Arm Ergometer   Level 2    Watts 35    Minutes 15    METs 1.5          Nutrition:  Target Goals: Understanding of nutrition guidelines, daily intake of sodium 1500mg , cholesterol 200mg , calories 30% from fat and 7% or less from saturated fats, daily to have 5 or more servings of fruits and vegetables.  Biometrics:  Pre Biometrics - 11/18/23 1017       Pre Biometrics   Height 5' (1.524 m)    Weight 172 lb 12.8 oz (78.4 kg)    Waist  Circumference 38 inches    Hip Circumference 44 inches    Waist to Hip Ratio 0.86 %    BMI (Calculated) 33.75    Grip Strength 19 kg    Single Leg Stand 1.9 seconds           Nutrition Therapy Plan and Nutrition Goals:  Nutrition Therapy & Goals - 11/16/23 0950       Intervention Plan   Intervention Prescribe, educate and counsel regarding individualized specific dietary modifications aiming towards targeted core components such as weight, hypertension, lipid management, diabetes, heart failure and other comorbidities.;Nutrition handout(s) given to patient.    Expected Outcomes Short Term Goal: Understand basic principles of dietary content, such as calories, fat, sodium, cholesterol and nutrients.;Short Term Goal: A plan has been developed with personal nutrition goals set during dietitian appointment.;Long Term Goal: Adherence to prescribed nutrition plan.          Nutrition Assessments:  Nutrition Assessments - 11/16/23 0959       Rate Your Plate Scores   Pre Score 47         MEDIFICTS Score Key: >=70 Need to make dietary changes  40-70 Heart Healthy Diet <= 40 Therapeutic Level Cholesterol Diet  Flowsheet Row PULMONARY VIRTUAL BASED CARE from 11/16/2023 in [facility] CARDIAC REHABILITATION  Picture Your Plate Total Score on Admission 47   Picture Your Plate Scores: <59 Unhealthy dietary pattern with much room for improvement. 41-50 Dietary pattern unlikely to meet recommendations for good health and room for improvement. 51-60 More healthful dietary pattern, with some room for improvement.  >60 Healthy dietary pattern, although there may be some specific behaviors that could be improved.    Nutrition Goals Re-Evaluation:  Nutrition Goals Re-Evaluation     Row Name 12/28/23 0828 01/13/24 0806           Goals   Current Weight 175 lb (79.4 kg) --      Nutrition Goal Healthy eating Healthy eating      Comment Dawn Hughes states her diet is going so so. She has  her good and bad days. She states she is trying to eat healthy and she is drinking enough fluids. She carries a big jug with her to work to sip on water all day. Dawn Hughes states her diet is going ok. She has her good and bad days. Encouraged  her to still have things she enjoys like sweets, but just in moderation. She states she is drinking enough water, as she takes a big jug around with her most days.      Expected Outcome Short: Increase fluid intake. Long: Try to incorporate more fresh fruits and veggies. Short: Increase fluid intake. Long: Try to incorporate more fresh fruits and veggies and limit sweets.         Nutrition Goals Discharge (Final Nutrition Goals Re-Evaluation):  Nutrition Goals Re-Evaluation - 01/13/24 0806       Goals   Nutrition Goal Healthy eating    Comment Adrieanna states her diet is going ok. She has her good and bad days. Encouraged her to still have things she enjoys like sweets, but just in moderation. She states she is drinking enough water, as she takes a big jug around with her most days.    Expected Outcome Short: Increase fluid intake. Long: Try to incorporate more fresh fruits and veggies and limit sweets.          Psychosocial: Target Goals: Acknowledge presence or absence of significant depression and/or stress, maximize coping skills, provide positive support system. Participant is able to verbalize types and ability to use techniques and skills needed for reducing stress and depression.  Initial Review & Psychosocial Screening:  Initial Psych Review & Screening - 11/16/23 0950       Initial Review   Current issues with Current Stress Concerns;Current Psychotropic Meds    Source of Stress Concerns Chronic Illness;Occupation    Comments Elantra states she is stressed a lot of the time, sometimes she feels like she makes her own stress. Has a history of anxiety.      Family Dynamics   Good Support System? Yes    Comments Louellen lives alone with her  dog, she says if she had an emergency to call her son who is on her contact list.      Barriers   Psychosocial barriers to participate in program There are no identifiable barriers or psychosocial needs.      Screening Interventions   Interventions Encouraged to exercise    Expected Outcomes Short Term goal: Utilizing psychosocial counselor, staff and physician to assist with identification of specific Stressors or current issues interfering with healing process. Setting desired goal for each stressor or current issue identified.;Long Term Goal: Stressors or current issues are controlled or eliminated.;Short Term goal: Identification and review with participant of any Quality of Life or Depression concerns found by scoring the questionnaire.;Long Term goal: The participant improves quality of Life and PHQ9 Scores as seen by post scores and/or verbalization of changes          Quality of Life Scores:  Scores of 19 and below usually indicate a poorer quality of life in these areas.  A difference of  2-3 points is a clinically meaningful difference.  A difference of 2-3 points in the total score of the Quality of Life Index has been associated with significant improvement in overall quality of life, self-image, physical symptoms, and general health in studies assessing change in quality of life.   PHQ-9: Review Flowsheet       11/18/2023 06/18/2020  Depression screen PHQ 2/9  Decreased Interest 1 0  Down, Depressed, Hopeless 1 0  PHQ - 2 Score 2 0  Altered sleeping 1 0  Tired, decreased energy 1 3  Change in appetite 1 0  Feeling bad or failure about yourself  1 0  Trouble concentrating 0 0  Moving slowly or fidgety/restless 0 0  Suicidal thoughts 0 0  PHQ-9 Score 6  3     Details       Data saved with a previous flowsheet row definition        Interpretation of Total Score  Total Score Depression Severity:  1-4 = Minimal depression, 5-9 = Mild depression, 10-14 = Moderate  depression, 15-19 = Moderately severe depression, 20-27 = Severe depression   Psychosocial Evaluation and Intervention:  Psychosocial Evaluation - 11/16/23 0952       Psychosocial Evaluation & Interventions   Interventions Stress management education;Relaxation education;Encouraged to exercise with the program and follow exercise prescription    Comments Tateanna is a pleasant 68 year old female who is coming into rehab for her COPD. She lives home alone with her dog. 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 She also walks some at home. She states she gets around ok, is a little wobbly at times, uses no assistive device. She has had a fall in the last year where she fell into her rose bushes at home. She wears no oxygen, but is supposed to wear a CPAP at night but when she moved here from [place]  she lost it and didn't feel like getting another one.    Expected Outcomes Short: Increase strength and stamina. Long: Improve shortness of breath.    Continue Psychosocial Services  Follow up required by staff          Psychosocial Re-Evaluation:  Psychosocial Re-Evaluation     Row Name 12/28/23 0827 01/13/24 0806           Psychosocial Re-Evaluation   Current issues with None Identified None Identified      Comments Dawn Hughes currently doesn't identify any stressors in her life at the moment. She is sleeping well as well. Dawn Hughes currently doesn't identify any stressors in her life at the moment. She is sleeping well at night.      Expected Outcomes Short: Continue to attend rehab. Long: Continue to have healthy stress outlets. Short: Continue to attend rehab. Long: Continue to have healthy stress outlets.      Interventions Encouraged to attend Pulmonary Rehabilitation for the exercise Encouraged to attend Pulmonary Rehabilitation for the exercise      Continue Psychosocial Services  Follow up required by staff Follow up required by staff          Psychosocial Discharge (Final Psychosocial Re-Evaluation):  Psychosocial Re-Evaluation - 01/13/24 0806       Psychosocial Re-Evaluation   Current issues with None Identified    Comments Dawn Hughes currently doesn't identify any stressors in her life at the moment. She is sleeping well at night.    Expected Outcomes Short: Continue to attend rehab. Long: Continue to have healthy stress outlets.    Interventions Encouraged to attend Pulmonary Rehabilitation for the exercise    Continue Psychosocial Services  Follow up required by staff           Education: Education Goals: Education classes will be provided on a weekly basis, covering required topics. Participant will state understanding/return demonstration of topics presented.  Learning Barriers/Preferences:  Learning Barriers/Preferences - 11/16/23 0945       Learning Barriers/Preferences   Learning Barriers Sight   wears glasses   Learning Preferences None          Education Topics: How Lungs Work and Diseases: - Discuss the anatomy of the  lungs and diseases that can affect the lungs, such as COPD.   Exercise: -Discuss the importance of exercise, FITT principles of exercise, normal and abnormal responses to exercise, and how to exercise safely.   Environmental Irritants: -Discuss types of environmental irritants and how to limit exposure to environmental irritants.   Meds/Inhalers and oxygen: - Discuss respiratory medications, definition of an inhaler and oxygen, and the proper way to use an inhaler and oxygen.   Energy Saving Techniques: - Discuss methods to conserve energy and decrease shortness of breath when performing activities of daily living.    Bronchial Hygiene / Breathing Techniques: - Discuss breathing mechanics, pursed-lip breathing technique,  proper posture, effective ways to clear airways, and other functional breathing techniques   Cleaning Equipment: - Provides group verbal and written  instruction about the health risks of elevated stress, cause of high stress, and healthy ways to reduce stress.   Nutrition I: Fats: - Discuss the types of cholesterol, what cholesterol does to the body, and how cholesterol levels can be controlled.   Nutrition II: Labels: -Discuss the different components of food labels and how to read food labels.   Respiratory Infections: - Discuss the signs and symptoms of respiratory infections, ways to prevent respiratory infections, and the importance of seeking medical treatment when having a respiratory infection.   Stress I: Signs and Symptoms: - Discuss the causes of stress, how stress may lead to anxiety and depression, and ways to limit stress.   Stress II: Relaxation: -Discuss relaxation techniques to limit stress.   Oxygen for Home/Travel: - Discuss how to prepare for travel when on oxygen and proper ways to transport and store oxygen to ensure safety.   Knowledge Questionnaire Score:  Knowledge Questionnaire Score - 11/16/23 0957       Knowledge Questionnaire Score   Pre Score 14/18          Core Components/Risk Factors/Patient Goals at Admission:  Personal Goals and Risk Factors at Admission - 11/18/23 1017       Core Components/Risk Factors/Patient Goals on Admission    Weight Management Yes;Obesity;Weight Loss    Intervention Weight Management: Develop a combined nutrition and exercise program designed to reach desired caloric intake, while maintaining appropriate intake of nutrient and fiber, sodium and fats, and appropriate energy expenditure required for the weight goal.;Weight Management: Provide education and appropriate resources to help participant work on and attain dietary goals.;Weight Management/Obesity: Establish reasonable short term and long term weight goals.;Obesity: Provide education and appropriate resources to help participant work on and attain dietary goals.    Admit Weight 172 lb 12.8 oz (78.4 kg)     Goal Weight: Short Term 170 lb (77.1 kg)    Goal Weight: Long Term 165 lb (74.8 kg)    Expected Outcomes Short Term: Continue to assess and modify interventions until short term weight is achieved;Long Term: Adherence to nutrition and physical activity/exercise program aimed toward attainment of established weight goal;Weight Loss: Understanding of general recommendations for a balanced deficit meal plan, which promotes 1-2 lb weight loss per week and includes a negative energy balance of [phone] kcal/d;Understanding recommendations for meals to include 15-35% energy as protein, 25-35% energy from fat, 35-60% energy from carbohydrates, less than 200mg  of dietary cholesterol, 20-35 gm of total fiber daily;Understanding of distribution of calorie intake throughout the day with the consumption of 4-5 meals/snacks    Improve shortness of breath with ADL's Yes    Intervention Provide education, individualized exercise plan and daily activity  instruction to help decrease symptoms of SOB with activities of daily living.    Expected Outcomes Short Term: Improve cardiorespiratory fitness to achieve a reduction of symptoms when performing ADLs;Long Term: Be able to perform more ADLs without symptoms or delay the onset of symptoms    Increase knowledge of respiratory medications and ability to use respiratory devices properly  Yes    Intervention Provide education and demonstration as needed of appropriate use of medications, inhalers, and oxygen therapy.    Expected Outcomes Short Term: Achieves understanding of medications use. Understands that oxygen is a medication prescribed by physician. Demonstrates appropriate use of inhaler and oxygen therapy.;Long Term: Maintain appropriate use of medications, inhalers, and oxygen therapy.    Stress Yes    Intervention Offer individual and/or small group education and counseling on adjustment to heart disease, stress management and health-related lifestyle change. Teach  and support self-help strategies.;Refer participants experiencing significant psychosocial distress to appropriate mental health specialists for further evaluation and treatment. When possible, include family members and significant others in education/counseling sessions.    Expected Outcomes Short Term: Participant demonstrates changes in health-related behavior, relaxation and other stress management skills, ability to obtain effective social support, and compliance with psychotropic medications if prescribed.;Long Term: Emotional wellbeing is indicated by absence of clinically significant psychosocial distress or social isolation.          Core Components/Risk Factors/Patient Goals Review:   Goals and Risk Factor Review     Row Name 12/28/23 0829 01/13/24 0807           Core Components/Risk Factors/Patient Goals Review   Personal Goals Review Improve shortness of breath with ADL's;Weight Management/Obesity;Develop more efficient breathing techniques such as purse lipped breathing and diaphragmatic breathing and practicing self-pacing with activity. Improve shortness of breath with ADL's;Weight Management/Obesity;Develop more efficient breathing techniques such as purse lipped breathing and diaphragmatic breathing and practicing self-pacing with activity.      Review Dawn Hughes is doing well in rehab. She checks her O2 at home and she states it stays consistent around 90-92%. She takes all her medicines as prescribed as well. Dawn Hughes is doing well in rehab. She checks her O2 at home and she states it stays consistent around 90-92%. She takes all her medicines as prescribed as well.      Expected Outcomes Short: Continue to attend rehab. Long: Continue checking O2 at home and report any abnormalities. Short: Continue to attend rehab. Long: Continue checking O2 at home and report any abnormalities.         Core Components/Risk Factors/Patient Goals at Discharge (Final Review):   Goals and Risk  Factor Review - 01/13/24 0807       Core Components/Risk Factors/Patient Goals Review   Personal Goals Review Improve shortness of breath with ADL's;Weight Management/Obesity;Develop more efficient breathing techniques such as purse lipped breathing and diaphragmatic breathing and practicing self-pacing with activity.    Review Dawn Hughes is doing well in rehab. She checks her O2 at home and she states it stays consistent around 90-92%. She takes all her medicines as prescribed as well.    Expected Outcomes Short: Continue to attend rehab. Long: Continue checking O2 at home and report any abnormalities.          ITP Comments:  ITP Comments     Row Name 11/16/23 0949 11/18/23 0930 11/23/23 0801 12/30/23 0750 01/26/24 0902   ITP Comments Completed virtual orientation today.  EP evaluation is scheduled for 9/24 at 0830 .  Documentation for diagnosis  can be found in [facility] encounter 10/29/23. Patient arrived for 1st visit/orientation/education at 0830. Patient was referred to PR by Dr. Malka due to COPD. During orientation advised patient on arrival and appointment times what to wear, what to do before, during and after exercise. Reviewed attendance and class policy.  Pt is scheduled to return Pulmonary Rehab on 11/23/23 at 745. Pt was advised to come to class 15 minutes before class starts.  Discussed RPE/Dpysnea scales. Patient participated in warm up stretches. Patient was able to complete 6 minute walk test. Patient was measured for the equipment. Discussed equipment safety with patient. Took patient pre-anthropometric measurements. Patient finished visit at 945. First full day of exercise!  Patient was oriented to gym and equipment including functions, settings, policies, and procedures.  Patient's individual exercise prescription and treatment plan were reviewed.  All starting workloads were established based on the results of the 6 minute walk test done at initial orientation visit.  The plan for  exercise progression was also introduced and progression will be customized based on patient's performance and goals. 30 day review completed. ITP sent to Dr.Jehanzeb Memon, Medical Director of  Pulmonary Rehab. Continue with ITP unless changes are made by physician. 30 day review completed. ITP sent to Dr.Jehanzeb Memon, Medical Director of  Pulmonary Rehab. Continue with ITP unless changes are made by physician.    Row Name 02/23/24 1140           ITP Comments 30 day review completed. ITP sent to Dr.Jehanzeb Memon, Medical Director of  Pulmonary Rehab. Continue with ITP unless changes are made by physician. Has been out since 01/27/24, unable to assess for goals this round.  Letter has been sent due to no return contact          Comments: 30 day review      [1]  Current Outpatient Medications:    albuterol  (VENTOLIN  HFA) 108 (90 Base) MCG/ACT inhaler, Inhale into the lungs every 6 (six) hours as needed for wheezing or shortness of breath., Disp: , Rfl:    alendronate (FOSAMAX) 70 MG tablet, Take 70 mg by mouth once a week., Disp: , Rfl:    Cyanocobalamin (VITAMIN B12) 1000 MCG TBCR, 1 tablet Orally Once a day; Duration: 30 day(s) (Patient not taking: Reported on 11/16/2023), Disp: , Rfl:    D3-50 1.25 MG (50000 UT) capsule, Take 50,000 Units by mouth once a week., Disp: , Rfl:    DULoxetine  (CYMBALTA ) 60 MG capsule, Take 60 mg by mouth 2 (two) times daily., Disp: , Rfl:    fluticasone-salmeterol (ADVAIR) 250-50 MCG/ACT AEPB, 1 puff 2 (two) times daily as needed., Disp: , Rfl:    Fluticasone-Umeclidin-Vilant (TRELEGY ELLIPTA ) 100-62.5-25 MCG/ACT AEPB, Inhale 1 puff into the lungs daily., Disp: 3 each, Rfl: 3   hydroxychloroquine  (PLAQUENIL ) 200 MG tablet, Take 1 tablet (200 mg total) by mouth daily., Disp: 90 tablet, Rfl: 0   levothyroxine  (SYNTHROID ) 100 MCG tablet, Take 100 mcg by mouth daily., Disp: , Rfl:    metolazone (ZAROXOLYN) 5 MG tablet, TAKE 1 TABLET BY MOUTH EVERY DAY; Duration:  90, Disp: , Rfl:    metoprolol succinate (TOPROL-XL) 25 MG 24 hr tablet, 1 tablet Orally Once a day; Duration: 90 days, Disp: , Rfl:    morphine  (MS CONTIN ) 30 MG 12 hr tablet, Take 30 mg by mouth every 12 (twelve) hours., Disp: , Rfl:    Multiple Vitamin (MULTIVITAMIN PO), Take by mouth., Disp: , Rfl:    Omega 3 1000 MG  CAPS, 1 capsule., Disp: , Rfl:    pantoprazole  (PROTONIX ) 40 MG tablet, Take 40 mg by mouth daily., Disp: , Rfl:    pregabalin  (LYRICA ) 300 MG capsule, Take 300 mg by mouth 2 (two) times daily., Disp: , Rfl:    tiZANidine  (ZANAFLEX ) 4 MG tablet, Take 4 mg by mouth 3 (three) times daily as needed., Disp: , Rfl:  [2]  Social History Tobacco Use  Smoking Status Former   Current packs/day: 0.00   Average packs/day: 1 pack/day for 52.0 years (52.0 ttl pk-yrs)   Types: Cigarettes   Start date: 34   Quit date: 2021   Years since quitting: 4.9   Passive exposure: Never  Smokeless Tobacco Never

## 2024-02-24 ENCOUNTER — Encounter (HOSPITAL_COMMUNITY)

## 2024-02-29 ENCOUNTER — Encounter (HOSPITAL_COMMUNITY)

## 2024-02-29 DIAGNOSIS — J449 Chronic obstructive pulmonary disease, unspecified: Secondary | ICD-10-CM

## 2024-02-29 NOTE — Progress Notes (Signed)
 Discharge Progress Report  Patient Details  Name: Dawn Hughes MRN: [id number] Date of Birth: 01/07/1956 Referring Provider:   Flowsheet Row PULMONARY REHAB COPD ORIENTATION from 11/18/2023 in [facility] CARDIAC REH[facility]ILITATION  Referring Provider Malka Domino MD     Number of Visits: 11  Reason for Discharge:  Early Exit:  Lack of attendance  Smoking History:  Tobacco Use History[1]  Diagnosis:  Stage 2 moderate COPD by GOLD classification (HCC)  ADL UCSD:   Initial Exercise Prescription:   Discharge Exercise Prescription (Final Exercise Prescription Changes):   Functional Capacity:   Psychological, QOL, Others - Outcomes: PHQ 2/9:    11/18/2023    9:28 AM 06/18/2020    9:06 AM  Depression screen PHQ 2/9  Decreased Interest 1 0  Down, Depressed, Hopeless 1 0  PHQ - 2 Score 2 0  Altered sleeping 1 0  Tired, decreased energy 1 3  Change in appetite 1 0  Feeling bad or failure about yourself  1 0  Trouble concentrating 0 0  Moving slowly or fidgety/restless 0 0  Suicidal thoughts 0 0  PHQ-9 Score 6  3      Data saved with a previous flowsheet row definition    Quality of Life:   Personal Goals: Goals established at orientation with interventions provided to work toward goal.    Personal Goals Discharge:  Goals and Risk Factor Review     Row Name 01/13/24 0807             Core Components/Risk Factors/Patient Goals Review   Personal Goals Review Improve shortness of breath with ADL's;Weight Management/Obesity;Develop more efficient breathing techniques such as purse lipped breathing and diaphragmatic breathing and practicing self-pacing with activity.       Review [name] is doing well in rehab. She checks her O2 at home and she states it stays consistent around 90-92%. She takes all her medicines as prescribed as well.       Expected Outcomes Short: Continue to attend rehab. Long: Continue checking O2 at home and report any abnormalities.           Exercise Goals and Review:   Exercise Goals Re-Evaluation:  Exercise Goals Re-Evaluation     Row Name 01/13/24 0807             Exercise Goal Re-Evaluation   Exercise Goals Review Increase Strength and Stamina;Increase Physical Activity;Understanding of Exercise Prescription       Comments [name] is doing well in rehab. 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 She also is trying Tai Chi now at home, and so far she states she likes it. She is increasing her levels on the Nustep and arm erogmeter as tolerated.       Expected Outcomes Short: Continue to attend rehab. Long: Incorporate more home exercise.          Nutrition & Weight - Outcomes:    Nutrition:   Nutrition Discharge:   Education Questionnaire Score:   Goals reviewed with patient; copy given to patient.    [1]  Social History Tobacco Use  Smoking Status Former   Current packs/day: 0.00   Average packs/day: 1 pack/day for 52.0 years (52.0 ttl pk-yrs)   Types: Cigarettes   Start date: 31   Quit date: 2021   Years since quitting: 5.0   Passive exposure: Never  Smokeless Tobacco Never

## 2024-02-29 NOTE — Progress Notes (Signed)
 Pulmonary Individual Treatment Plan  Patient Details  Name: Dawn Hughes MRN: [id number] Date of Birth: 1955-11-26 Referring Provider:   Flowsheet Row PULMONARY REHAB COPD ORIENTATION from 11/18/2023 in [facility] CARDIAC REHABIL[facility]ATION  Referring Provider Assaker, Darrin MD    Initial Encounter Date:  Flowsheet Row PULMONARY REHAB COPD ORIENTATION from 11/18/2023 in [facility]ARDIAC REH[facility]ILITATION  Date 11/18/23    Visit Diagnosis: Stage 2 moderate COPD by GOLD classification (HCC)  Patient's Home Medications on Admission: Current Medications[1]  Past Medical History: Past Medical History:  Diagnosis Date   COPD (chronic obstructive pulmonary disease) (HCC)    Fibromyalgia    GAD (generalized anxiety disorder)    GERD (gastroesophageal reflux disease)    Hypertension    Hypothyroidism    Lung nodules    Other intervertebral disc degeneration, lumbar region    Sleep apnea     Tobacco Use: Tobacco Use History[2]  Labs: Review Flowsheet        No data to display           Pulmonary Assessment Scores:   UCSD: Self-administered rating of dyspnea associated with activities of daily living (ADLs) 6-point scale (0 = not at all to 5 = maximal or unable to do because of breathlessness)  Scoring Scores range from 0 to 120.  Minimally important difference is 5 units  CAT: CAT can identify the health impairment of COPD patients and is better correlated with disease progression.  CAT has a scoring range of zero to 40. The CAT score is classified into four groups of low (less than 10), medium (10 - 20), high (21-30) and very high (31-40) based on the impact level of disease on health status. A CAT score over 10 suggests significant symptoms.  A worsening CAT score could be explained by an exacerbation, poor medication adherence, poor inhaler technique, or progression of COPD or comorbid conditions.  CAT MCID is 2 points  mMRC: mMRC (Modified Medical Research  Council) Dyspnea Scale is used to assess the degree of baseline functional disability in patients of respiratory disease due to dyspnea. No minimal important difference is established. A decrease in score of 1 point or greater is considered a positive change.   Pulmonary Function Assessment:   Exercise Target Goals: Exercise Program Goal: Individual exercise prescription set using results from initial 6 min walk test and THRR while considering  patients activity barriers and safety.   Exercise Prescription Goal: Initial exercise prescription builds to 30-45 minutes a day of aerobic activity, 2-3 days per week.  Home exercise guidelines will be given to patient during program as part of exercise prescription that the participant will acknowledge.  Education: Aerobic Exercise: - Group verbal and visual presentation on the components of exercise prescription. Introduces F.I.T.T principle from ACSM for exercise prescriptions.  Reviews F.I.T.T. principles of aerobic exercise including progression. Written material provided at class time.   Education: Resistance Exercise: - Group verbal and visual presentation on the components of exercise prescription. Introduces F.I.T.T principle from ACSM for exercise prescriptions  Reviews F.I.T.T. principles of resistance exercise including progression. Written material provided at class time.    Education: Exercise & Equipment Safety: - Individual verbal instruction and demonstration of equipment use and safety with use of the equipment.   Education: Exercise Physiology & General Exercise Guidelines: - Group verbal and written instruction with models to review the exercise physiology of the cardiovascular system and associated critical values. Provides general exercise guidelines with specific guidelines to those with heart  or lung disease.  Flowsheet Row PULMONARY REHAB CHRONIC OBSTRUCTIVE PULMONARY DISEASE from 01/27/2024 in [facility]  Date 12/16/23  Educator jh  Instruction Review Code 1- Verbalizes Understanding    Education: Flexibility, Balance, Mind/Body Relaxation: - Group verbal and visual presentation with interactive activity on the components of exercise prescription. Introduces F.I.T.T principle from ACSM for exercise prescriptions. Reviews F.I.T.T. principles of flexibility and balance exercise training including progression. Also discusses the mind body connection.  Reviews various relaxation techniques to help reduce and manage stress (i.e. Deep breathing, progressive muscle relaxation, and visualization). Balance handout provided to take home. Written material provided at class time.   Activity Barriers & Risk Stratification:   6 Minute Walk:  Oxygen Initial Assessment:   Oxygen Re-Evaluation:  Oxygen Re-Evaluation     Row Name 01/13/24 0808             Program Oxygen Prescription   Program Oxygen Prescription None         Home Oxygen   Home Oxygen Device None       Sleep Oxygen Prescription None       Home Exercise Oxygen Prescription None       Home Resting Oxygen Prescription None         Goals/Expected Outcomes   Short Term Goals To learn and understand importance of monitoring SPO2 with pulse oximeter and demonstrate accurate use of the pulse oximeter.;To learn and understand importance of maintaining oxygen saturations>88%;To learn and demonstrate proper pursed lip breathing techniques or other breathing techniques. ;To learn and demonstrate proper use of respiratory medications       Long  Term Goals Verbalizes importance of monitoring SPO2 with pulse oximeter and return demonstration;Maintenance of O2 saturations>88%;Exhibits proper breathing techniques, such as pursed lip breathing or other method taught during program session;Compliance with respiratory medication;Demonstrates proper use of MDIs       Comments Mikhia feels her breathing has improved since starting  the program. She states she can climb stairs easier now. She says her O2 sats are running good at home as well.       Goals/Expected Outcomes Short: Continue to attend rehab. Long: Continue checking O2 sats at home.          Oxygen Discharge (Final Oxygen Re-Evaluation):  Oxygen Re-Evaluation - 01/13/24 0808       Program Oxygen Prescription   Program Oxygen Prescription None      Home Oxygen   Home Oxygen Device None    Sleep Oxygen Prescription None    Home Exercise Oxygen Prescription None    Home Resting Oxygen Prescription None      Goals/Expected Outcomes   Short Term Goals To learn and understand importance of monitoring SPO2 with pulse oximeter and demonstrate accurate use of the pulse oximeter.;To learn and understand importance of maintaining oxygen saturations>88%;To learn and demonstrate proper pursed lip breathing techniques or other breathing techniques. ;To learn and demonstrate proper use of respiratory medications    Long  Term Goals Verbalizes importance of monitoring SPO2 with pulse oximeter and return demonstration;Maintenance of O2 saturations>88%;Exhibits proper breathing techniques, such as pursed lip breathing or other method taught during program session;Compliance with respiratory medication;Demonstrates proper use of MDIs    Comments Enedelia feels her breathing has improved since starting the program. She states she can climb stairs easier now. She says her O2 sats are running good at home as well.    Goals/Expected Outcomes Short: Continue to attend rehab. Long: Continue checking  O2 sats at home.          Initial Exercise Prescription:   Perform Capillary Blood Glucose checks as needed.  Exercise Prescription Changes:   Exercise Comments:   Exercise Goals and Review:   Exercise Goals Re-Evaluation :  Exercise Goals Re-Evaluation     Row Name 01/13/24 0807             Exercise Goal Re-Evaluation   Exercise Goals Review Increase Strength  and Stamina;Increase Physical Activity;Understanding of Exercise Prescription       Comments Elizebeth is doing well in rehab. 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 She also is trying Tai Chi now at home, and so far she states she likes it. She is increasing her levels on the Nustep and arm erogmeter as tolerated.       Expected Outcomes Short: Continue to attend rehab. Long: Incorporate more home exercise.          Discharge Exercise Prescription (Final Exercise Prescription Changes):   Nutrition:  Target Goals: Understanding of nutrition guidelines, daily intake of sodium 1500mg , cholesterol 200mg , calories 30% from fat and 7% or less from saturated fats, daily to have 5 or more servings of fruits and vegetables.  Education: Nutrition 1 -Group instruction provided by verbal, written material, interactive activities, discussions, models, and posters to present general guidelines for heart healthy nutrition including macronutrients, label reading, and promoting whole foods over processed counterparts. Education serves as pensions consultant of discussion of heart healthy eating for all. Written material provided at class time.     Education: Nutrition 2 -Group instruction provided by verbal, written material, interactive activities, discussions, models, and posters to present general guidelines for heart healthy nutrition including sodium, cholesterol, and saturated fat. Providing guidance of habit forming to improve blood pressure, cholesterol, and body weight. Written material provided at class time.     Biometrics:    Nutrition Therapy Plan and Nutrition Goals:   Nutrition Assessments:  MEDIFICTS Score Key: >=70 Need to make dietary changes  40-70 Heart Healthy Diet <= 40 Therapeutic Level Cholesterol Diet  Flowsheet Row PULMONARY VIRTUAL BASED CARE from 11/16/2023 in [facility] CARDIAC REHABILITATION  Picture Your Plate Total Score on Admission 47   Picture  Your Plate Scores: <59 Unhealthy dietary pattern with much room for improvement. 41-50 Dietary pattern unlikely to meet recommendations for good health and room for improvement. 51-60 More healthful dietary pattern, with some room for improvement.  >60 Healthy dietary pattern, although there may be some specific behaviors that could be improved.   Nutrition Goals Re-Evaluation:  Nutrition Goals Re-Evaluation     Row Name 01/13/24 0806             Goals   Nutrition Goal Healthy eating       Comment Lizzet states her diet is going ok. She has her good and bad days. Encouraged her to still have things she enjoys like sweets, but just in moderation. She states she is drinking enough water, as she takes a big jug around with her most days.       Expected Outcome Short: Increase fluid intake. Long: Try to incorporate more fresh fruits and veggies and limit sweets.          Nutrition Goals Discharge (Final Nutrition Goals Re-Evaluation):  Nutrition Goals Re-Evaluation - 01/13/24 0806       Goals   Nutrition Goal Healthy eating    Comment Reesha states her diet is going  ok. She has her good and bad days. Encouraged her to still have things she enjoys like sweets, but just in moderation. She states she is drinking enough water, as she takes a big jug around with her most days.    Expected Outcome Short: Increase fluid intake. Long: Try to incorporate more fresh fruits and veggies and limit sweets.          Psychosocial: Target Goals: Acknowledge presence or absence of significant depression and/or stress, maximize coping skills, provide positive support system. Participant is able to verbalize types and ability to use techniques and skills needed for reducing stress and depression.   Education: Stress, Anxiety, and Depression - Group verbal and visual presentation to define topics covered.  Reviews how body is impacted by stress, anxiety, and depression.  Also discusses healthy ways  to reduce stress and to treat/manage anxiety and depression.  Written material provided at class time. Flowsheet Row PULMONARY REHAB CHRONIC OBSTRUCTIVE PULMONARY DISEASE from 01/27/2024 in [facility]  Date 01/13/24  Educator [facility]  Instruction Review Code 1- Verbalizes Understanding    Education: Sleep Hygiene -Provides group verbal and written instruction about how sleep can affect your health.  Define sleep hygiene, discuss sleep cycles and impact of sleep habits. Review good sleep hygiene tips.    Initial Review & Psychosocial Screening:   Quality of Life Scores:  Scores of 19 and below usually indicate a poorer quality of life in these areas.  A difference of  2-3 points is a clinically meaningful difference.  A difference of 2-3 points in the total score of the Quality of Life Index has been associated with significant improvement in overall quality of life, self-image, physical symptoms, and general health in studies assessing change in quality of life.  PHQ-9: Review Flowsheet       11/18/2023 06/18/2020  Depression screen PHQ 2/9  Decreased Interest 1 0  Down, Depressed, Hopeless 1 0  PHQ - 2 Score 2 0  Altered sleeping 1 0  Tired, decreased energy 1 3  Change in appetite 1 0  Feeling bad or failure about yourself  1 0  Trouble concentrating 0 0  Moving slowly or fidgety/restless 0 0  Suicidal thoughts 0 0  PHQ-9 Score 6  3     Details       Data saved with a previous flowsheet row definition        Interpretation of Total Score  Total Score Depression Severity:  1-4 = Minimal depression, 5-9 = Mild depression, 10-14 = Moderate depression, 15-19 = Moderately severe depression, 20-27 = Severe depression   Psychosocial Evaluation and Intervention:   Psychosocial Re-Evaluation:  Psychosocial Re-Evaluation     Row Name 01/13/24 0806             Psychosocial Re-Evaluation   Current issues with None Identified       Comments Kyler  currently doesn't identify any stressors in her life at the moment. She is sleeping well at night.       Expected Outcomes Short: Continue to attend rehab. Long: Continue to have healthy stress outlets.       Interventions Encouraged to attend Pulmonary Rehabilitation for the exercise       Continue Psychosocial Services  Follow up required by staff          Psychosocial Discharge (Final Psychosocial Re-Evaluation):  Psychosocial Re-Evaluation - 01/13/24 0806       Psychosocial Re-Evaluation   Current issues with None Identified  Comments Babe currently doesn't identify any stressors in her life at the moment. She is sleeping well at night.    Expected Outcomes Short: Continue to attend rehab. Long: Continue to have healthy stress outlets.    Interventions Encouraged to attend Pulmonary Rehabilitation for the exercise    Continue Psychosocial Services  Follow up required by staff          Education: Education Goals: Education classes will be provided on a weekly basis, covering required topics. Participant will state understanding/return demonstration of topics presented.  Learning Barriers/Preferences:   General Pulmonary Education Topics:  Infection Prevention: - Provides verbal and written material to individual with discussion of infection control including proper hand washing and proper equipment cleaning during exercise session.   Falls Prevention: - Provides verbal and written material to individual with discussion of falls prevention and safety.   Chronic Lung Disease Review: - Group verbal instruction with posters, models, PowerPoint presentations and videos,  to review new updates, new respiratory medications, new advancements in procedures and treatments. Providing information on websites and 800 numbers for continued self-education. Includes information about supplement oxygen, available portable oxygen systems, continuous and intermittent flow rates, oxygen  safety, concentrators, and Medicare reimbursement for oxygen. Explanation of Pulmonary Drugs, including class, frequency, complications, importance of spacers, rinsing mouth after steroid MDI's, and proper cleaning methods for nebulizers. Review of basic lung anatomy and physiology related to function, structure, and complications of lung disease. Review of risk factors. Discussion about methods for diagnosing sleep apnea and types of masks and machines for OSA. Includes a review of the use of types of environmental controls: home humidity, furnaces, filters, dust mite/pet prevention, HEPA vacuums. Discussion about weather changes, air quality and the benefits of nasal washing. Instruction on Warning signs, infection symptoms, calling MD promptly, preventive modes, and value of vaccinations. Review of effective airway clearance, coughing and/or vibration techniques. Emphasizing that all should Create an Action Plan. Written material provided at class time. Flowsheet Row PULMONARY REHAB CHRONIC OBSTRUCTIVE PULMONARY DISEASE from 01/27/2024 in [place] [facility]  Date 12/02/23  Educator [facility]  Instruction Review Code 1- Verbalizes Understanding    AED/CPR: - Group verbal and written instruction with the use of models to demonstrate the basic use of the AED with the basic ABC's of resuscitation.    Tests and Procedures:  - Group verbal and visual presentation and models provide information about basic cardiac anatomy and function. Reviews the testing methods done to diagnose heart disease and the outcomes of the test results. Describes the treatment choices: Medical Management, Angioplasty, or Coronary Bypass Surgery for treating various heart conditions including Myocardial Infarction, Angina, Valve Disease, and Cardiac Arrhythmias.  Written material provided at class time. Flowsheet Row PULMONARY REHAB CHRONIC OBSTRUCTIVE PULMONARY DISEASE from 01/27/2024 in [place] [facility]   Date 12/09/23  Educator jh  Instruction Review Code 2- Demonstrated Understanding    Medication Safety: - Group verbal and visual instruction to review commonly prescribed medications for heart and lung disease. Reviews the medication, class of the drug, and side effects. Includes the steps to properly store meds and maintain the prescription regimen.  Written material given at graduation. Flowsheet Row PULMONARY REHAB CHRONIC OBSTRUCTIVE PULMONARY DISEASE from 01/27/2024 in [place] [facility]  Date 11/25/23  Educator [facility]  Instruction Review Code 1- Verbalizes Understanding    Other: -Provides group and verbal instruction on various topics (see comments)   Knowledge Questionnaire Score:    Core Components/Risk Factors/Patient Goals at Admission:  Education:Diabetes - Individual verbal and written instruction to review signs/symptoms of diabetes, desired ranges of glucose level fasting, after meals and with exercise. Acknowledge that pre and post exercise glucose checks will be done for 3 sessions at entry of program.   Know Your Numbers and Heart Failure: - Group verbal and visual instruction to discuss disease risk factors for cardiac and pulmonary disease and treatment options.  Reviews associated critical values for Overweight/Obesity, Hypertension, Cholesterol, and Diabetes.  Discusses basics of heart failure: signs/symptoms and treatments.  Introduces Heart Failure Zone chart for action plan for heart failure. Written material provided at class time.   Core Components/Risk Factors/Patient Goals Review:   Goals and Risk Factor Review     Row Name 01/13/24 0807             Core Components/Risk Factors/Patient Goals Review   Personal Goals Review Improve shortness of breath with ADL's;Weight Management/Obesity;Develop more efficient breathing techniques such as purse lipped breathing and diaphragmatic breathing and practicing self-pacing with activity.        Review Alsace is doing well in rehab. She checks her O2 at home and she states it stays consistent around 90-92%. She takes all her medicines as prescribed as well.       Expected Outcomes Short: Continue to attend rehab. Long: Continue checking O2 at home and report any abnormalities.          Core Components/Risk Factors/Patient Goals at Discharge (Final Review):   Goals and Risk Factor Review - 01/13/24 0807       Core Components/Risk Factors/Patient Goals Review   Personal Goals Review Improve shortness of breath with ADL's;Weight Management/Obesity;Develop more efficient breathing techniques such as purse lipped breathing and diaphragmatic breathing and practicing self-pacing with activity.    Review Edythe is doing well in rehab. She checks her O2 at home and she states it stays consistent around 90-92%. She takes all her medicines as prescribed as well.    Expected Outcomes Short: Continue to attend rehab. Long: Continue checking O2 at home and report any abnormalities.          ITP Comments:  ITP Comments     Row Name 01/26/24 0902 02/23/24 1140 02/29/24 1149       ITP Comments 30 day review completed. ITP sent to Dr.Jehanzeb Memon, Medical Director of  Pulmonary Rehab. Continue with ITP unless changes are made by physician. 30 day review completed. ITP sent to Dr.Jehanzeb Memon, Medical Director of  Pulmonary Rehab. Continue with ITP unless changes are made by physician. Has been out since 01/27/24, unable to assess for goals this round.  Letter has been sent due to no return contact Discharging patient due to lack of attendance        Comments: Discharge ITP      [1]  Current Outpatient Medications:    albuterol  (VENTOLIN  HFA) 108 (90 Base) MCG/ACT inhaler, Inhale into the lungs every 6 (six) hours as needed for wheezing or shortness of breath., Disp: , Rfl:    alendronate (FOSAMAX) 70 MG tablet, Take 70 mg by mouth once a week., Disp: , Rfl:    Cyanocobalamin  (VITAMIN B12) 1000 MCG TBCR, 1 tablet Orally Once a day; Duration: 30 day(s) (Patient not taking: Reported on 11/16/2023), Disp: , Rfl:    D3-50 1.25 MG (50000 UT) capsule, Take 50,000 Units by mouth once a week., Disp: , Rfl:    DULoxetine  (CYMBALTA ) 60 MG capsule, Take 60 mg by mouth 2 (two) times daily., Disp: ,  Rfl:    fluticasone-salmeterol (ADVAIR) 250-50 MCG/ACT AEPB, 1 puff 2 (two) times daily as needed., Disp: , Rfl:    Fluticasone-Umeclidin-Vilant (TRELEGY ELLIPTA ) 100-62.5-25 MCG/ACT AEPB, Inhale 1 puff into the lungs daily., Disp: 3 each, Rfl: 3   hydroxychloroquine  (PLAQUENIL ) 200 MG tablet, Take 1 tablet (200 mg total) by mouth daily., Disp: 90 tablet, Rfl: 0   levothyroxine  (SYNTHROID ) 100 MCG tablet, Take 100 mcg by mouth daily., Disp: , Rfl:    metolazone (ZAROXOLYN) 5 MG tablet, TAKE 1 TABLET BY MOUTH EVERY DAY; Duration: 90, Disp: , Rfl:    metoprolol succinate (TOPROL-XL) 25 MG 24 hr tablet, 1 tablet Orally Once a day; Duration: 90 days, Disp: , Rfl:    morphine  (MS CONTIN ) 30 MG 12 hr tablet, Take 30 mg by mouth every 12 (twelve) hours., Disp: , Rfl:    Multiple Vitamin (MULTIVITAMIN PO), Take by mouth., Disp: , Rfl:    Omega 3 1000 MG CAPS, 1 capsule., Disp: , Rfl:    pantoprazole  (PROTONIX ) 40 MG tablet, Take 40 mg by mouth daily., Disp: , Rfl:    pregabalin  (LYRICA ) 300 MG capsule, Take 300 mg by mouth 2 (two) times daily., Disp: , Rfl:    tiZANidine  (ZANAFLEX ) 4 MG tablet, Take 4 mg by mouth 3 (three) times daily as needed., Disp: , Rfl:  [2]  Social History Tobacco Use  Smoking Status Former   Current packs/day: 0.00   Average packs/day: 1 pack/day for 52.0 years (52.0 ttl pk-yrs)   Types: Cigarettes   Start date: 42   Quit date: 2021   Years since quitting: 5.0   Passive exposure: Never  Smokeless Tobacco Never

## 2024-03-02 ENCOUNTER — Encounter (HOSPITAL_COMMUNITY)

## 2024-03-07 ENCOUNTER — Encounter

## 2024-03-07 ENCOUNTER — Encounter (HOSPITAL_COMMUNITY)

## 2024-03-07 ENCOUNTER — Ambulatory Visit: Payer: Self-pay | Admitting: Physician Assistant

## 2024-03-07 ENCOUNTER — Encounter: Payer: Self-pay | Admitting: Physician Assistant

## 2024-03-07 ENCOUNTER — Encounter (HOSPITAL_COMMUNITY): Attending: Physician Assistant | Admitting: Physician Assistant

## 2024-03-07 VITALS — BP 146/75 | HR 84 | Temp 98.0°F | Resp 16 | Ht 60.0 in | Wt 185.4 lb

## 2024-03-07 DIAGNOSIS — M2242 Chondromalacia patellae, left knee: Secondary | ICD-10-CM | POA: Insufficient documentation

## 2024-03-07 DIAGNOSIS — M1612 Unilateral primary osteoarthritis, left hip: Secondary | ICD-10-CM | POA: Diagnosis not present

## 2024-03-07 DIAGNOSIS — M1120 Other chondrocalcinosis, unspecified site: Secondary | ICD-10-CM | POA: Diagnosis not present

## 2024-03-07 DIAGNOSIS — I10 Essential (primary) hypertension: Secondary | ICD-10-CM | POA: Insufficient documentation

## 2024-03-07 DIAGNOSIS — M5442 Lumbago with sciatica, left side: Secondary | ICD-10-CM

## 2024-03-07 DIAGNOSIS — E559 Vitamin D deficiency, unspecified: Secondary | ICD-10-CM | POA: Insufficient documentation

## 2024-03-07 DIAGNOSIS — Z79899 Other long term (current) drug therapy: Secondary | ICD-10-CM | POA: Diagnosis not present

## 2024-03-07 DIAGNOSIS — M19072 Primary osteoarthritis, left ankle and foot: Secondary | ICD-10-CM | POA: Insufficient documentation

## 2024-03-07 DIAGNOSIS — M19071 Primary osteoarthritis, right ankle and foot: Secondary | ICD-10-CM | POA: Diagnosis not present

## 2024-03-07 DIAGNOSIS — Z8739 Personal history of other diseases of the musculoskeletal system and connective tissue: Secondary | ICD-10-CM | POA: Diagnosis not present

## 2024-03-07 DIAGNOSIS — G5603 Carpal tunnel syndrome, bilateral upper limbs: Secondary | ICD-10-CM | POA: Diagnosis not present

## 2024-03-07 DIAGNOSIS — M797 Fibromyalgia: Secondary | ICD-10-CM | POA: Diagnosis not present

## 2024-03-07 DIAGNOSIS — M5441 Lumbago with sciatica, right side: Secondary | ICD-10-CM

## 2024-03-07 DIAGNOSIS — G894 Chronic pain syndrome: Secondary | ICD-10-CM | POA: Diagnosis not present

## 2024-03-07 DIAGNOSIS — M2241 Chondromalacia patellae, right knee: Secondary | ICD-10-CM | POA: Insufficient documentation

## 2024-03-07 DIAGNOSIS — M19041 Primary osteoarthritis, right hand: Secondary | ICD-10-CM | POA: Diagnosis not present

## 2024-03-07 DIAGNOSIS — M19042 Primary osteoarthritis, left hand: Secondary | ICD-10-CM | POA: Insufficient documentation

## 2024-03-07 DIAGNOSIS — M0579 Rheumatoid arthritis with rheumatoid factor of multiple sites without organ or systems involvement: Secondary | ICD-10-CM | POA: Diagnosis not present

## 2024-03-07 DIAGNOSIS — Z8709 Personal history of other diseases of the respiratory system: Secondary | ICD-10-CM | POA: Insufficient documentation

## 2024-03-07 DIAGNOSIS — R7689 Other specified abnormal immunological findings in serum: Secondary | ICD-10-CM | POA: Insufficient documentation

## 2024-03-07 DIAGNOSIS — Z8639 Personal history of other endocrine, nutritional and metabolic disease: Secondary | ICD-10-CM | POA: Insufficient documentation

## 2024-03-07 DIAGNOSIS — R7303 Prediabetes: Secondary | ICD-10-CM | POA: Insufficient documentation

## 2024-03-07 NOTE — Progress Notes (Signed)
 X-rays of SI joints are unremarkable. Please notify the patient.

## 2024-03-07 NOTE — Progress Notes (Signed)
 XR of the lumbar spine revealed multilevel spondylosis and facet joint arthropathy.  Please notify the patient and refer the patient to Dr. Georgina for further evaluation and management.

## 2024-03-08 LAB — COMPREHENSIVE METABOLIC PANEL WITH GFR
AG Ratio: 1.8 (calc) (ref 1.0–2.5)
ALT: 17 U/L (ref 6–29)
AST: 24 U/L (ref 10–35)
Albumin: 4.6 g/dL (ref 3.6–5.1)
Alkaline phosphatase (APISO): 84 U/L (ref 37–153)
BUN: 16 mg/dL (ref 7–25)
CO2: 31 mmol/L (ref 20–32)
Calcium: 9.7 mg/dL (ref 8.6–10.4)
Chloride: 100 mmol/L (ref 98–110)
Creat: 0.92 mg/dL (ref 0.60–1.00)
Globulin: 2.6 g/dL (ref 1.9–3.7)
Glucose, Bld: 90 mg/dL (ref 65–99)
Potassium: 4.4 mmol/L (ref 3.5–5.3)
Sodium: 141 mmol/L (ref 135–146)
Total Bilirubin: 0.4 mg/dL (ref 0.2–1.2)
Total Protein: 7.2 g/dL (ref 6.1–8.1)
eGFR: 67 mL/min/1.73m2

## 2024-03-08 LAB — CBC WITH DIFFERENTIAL/PLATELET
Absolute Lymphocytes: 1279 {cells}/uL (ref 850–3900)
Absolute Monocytes: 508 {cells}/uL (ref 200–950)
Basophils Absolute: 41 {cells}/uL (ref 0–200)
Basophils Relative: 0.5 %
Eosinophils Absolute: 139 {cells}/uL (ref 15–500)
Eosinophils Relative: 1.7 %
HCT: 46.8 % — ABNORMAL HIGH (ref 35.9–46.0)
Hemoglobin: 14.9 g/dL (ref 11.7–15.5)
MCH: 27.7 pg (ref 27.0–33.0)
MCHC: 31.8 g/dL (ref 31.6–35.4)
MCV: 87.2 fL (ref 81.4–101.7)
MPV: 11.4 fL (ref 7.5–12.5)
Monocytes Relative: 6.2 %
Neutro Abs: 6232 {cells}/uL (ref 1500–7800)
Neutrophils Relative %: 76 %
Platelets: 240 Thousand/uL (ref 140–400)
RBC: 5.37 Million/uL — ABNORMAL HIGH (ref 3.80–5.10)
RDW: 12.5 % (ref 11.0–15.0)
Total Lymphocyte: 15.6 %
WBC: 8.2 Thousand/uL (ref 3.8–10.8)

## 2024-03-08 NOTE — Progress Notes (Signed)
 CBC stable. CMP WNL.

## 2024-03-09 ENCOUNTER — Encounter (HOSPITAL_COMMUNITY)

## 2024-03-09 ENCOUNTER — Other Ambulatory Visit: Payer: Self-pay | Admitting: *Deleted

## 2024-03-09 DIAGNOSIS — M5441 Lumbago with sciatica, right side: Secondary | ICD-10-CM

## 2024-03-14 ENCOUNTER — Encounter (HOSPITAL_COMMUNITY)

## 2024-03-14 ENCOUNTER — Ambulatory Visit (HOSPITAL_COMMUNITY)

## 2024-03-16 ENCOUNTER — Encounter (HOSPITAL_COMMUNITY)

## 2024-03-21 ENCOUNTER — Encounter (HOSPITAL_COMMUNITY)

## 2024-03-23 ENCOUNTER — Telehealth: Payer: Self-pay | Admitting: Acute Care

## 2024-03-23 ENCOUNTER — Encounter (HOSPITAL_COMMUNITY)

## 2024-03-23 DIAGNOSIS — Z122 Encounter for screening for malignant neoplasm of respiratory organs: Secondary | ICD-10-CM

## 2024-03-23 DIAGNOSIS — Z87891 Personal history of nicotine dependence: Secondary | ICD-10-CM

## 2024-03-23 NOTE — Telephone Encounter (Signed)
 Lung Cancer Screening Narrative/Criteria Questionnaire (Cigarette Smokers Only- No Cigars/Pipes/vapes)   Dawn Hughes   SDMV:03/29/2024 8:30a Dawn Hughes       1955/05/11   LDCT: 04/05/2024 7:30a AP    68 y.o.   Phone: [phone]  Lung Screening Narrative (confirm age 9-77 yrs Medicare / 50-80 yrs Private pay insurance)   Insurance information:[employer] mcr   Referring Provider:Dr. Assaker   This screening involves an initial phone call with a team member from our program. It is called a shared decision making visit. The initial meeting is required by  insurance and Medicare to make sure you understand the program. This appointment takes about 15-20 minutes to complete. You will complete the screening scan at your scheduled date/time.  This scan takes about 5-10 minutes to complete. You can eat and drink normally before and after the scan.  Criteria questions for Lung Cancer Screening:   Are you a current or former smoker? Former Age began smoking: 69yo   If you are a former smoker, what year did you quit smoking? 2022(within 15 yrs)   To calculate your smoking history, I need an accurate estimate of how many packs of cigarettes you smoked per day and for how many years. (Not just the number of PPD you are now smoking)   Years smoking 51 x Packs per day 1 = Pack years 51   (at least 20 pack yrs)   (Make sure they understand that we need to know how much they have smoked in the past, not just the number of PPD they are smoking now)  Do you have a personal history of cancer?  No    Do you have a family history of cancer? No  Are you coughing up blood?  No  Have you had unexplained weight loss of 15 lbs or more in the last 6 months? No  It looks like you meet all criteria.  When would be a good time for us  to schedule you for this screening?   Additional information: N/A

## 2024-03-28 ENCOUNTER — Encounter (HOSPITAL_COMMUNITY)

## 2024-03-29 ENCOUNTER — Ambulatory Visit: Admitting: *Deleted

## 2024-03-29 DIAGNOSIS — Z87891 Personal history of nicotine dependence: Secondary | ICD-10-CM

## 2024-03-29 NOTE — Progress Notes (Signed)
 Virtual Visit via Telephone Note  I connected with Dawn Hughes on 03/29/24 at  8:30 AM EST by telephone and verified that I am speaking with the correct person using two identifiers.  Location: Patient: at home Provider: [address] [place], [place], [address]    I discussed the limitations, risks, security and privacy concerns of performing an evaluation and management service by telephone and the availability of in person appointments. I also discussed with the patient that there may be a patient responsible charge related to this service. The patient expressed understanding and agreed to proceed.   Shared Decision Making Visit Lung Cancer Screening Program [phone])   Eligibility: Age 69 y.o. Pack Years Smoking History Calculation 51 (# packs/per year x # years smoked) Recent History of coughing up blood  no Unexplained weight loss? no ( >Than 15 pounds within the last 6 months ) Prior History Lung / other cancer no (Diagnosis within the last 5 years already requiring surveillance chest CT Scans). Smoking Status Former Smoker Former Smokers: Years since quit: 4 years  Quit Date: 2022  Visit Components: Discussion included one or more decision making aids. yes Discussion included risk/benefits of screening. yes Discussion included potential follow up diagnostic testing for abnormal scans. yes Discussion included meaning and risk of over diagnosis. yes Discussion included meaning and risk of False Positives. yes Discussion included meaning of total radiation exposure. yes  Counseling Included: Importance of adherence to annual lung cancer LDCT screening. yes Impact of comorbidities on ability to participate in the program. yes Ability and willingness to under diagnostic treatment. yes  Smoking Cessation Counseling: Current Smokers:  Discussed importance of smoking cessation. yes Information about tobacco cessation classes and interventions provided to  patient. yes Patient provided with ticket for LDCT Scan. no Symptomatic Patient. no  Counseling(Intermediate counseling: > three minutes) 99406 Diagnosis Code: Tobacco Use Z72.0 Asymptomatic Patient yes  Counseling (Intermediate counseling: > three minutes counseling) [id number] Former Smokers:  Discussed the importance of maintaining cigarette abstinence. yes Diagnosis Code: Personal History of Nicotine Dependence. S12.108 Information about tobacco cessation classes and interventions provided to patient. Yes Patient provided with ticket for LDCT Scan. no Written Order for Lung Cancer Screening with LDCT placed in Epic. Yes (CT Chest Lung Cancer Screening Low Dose W/O CM) [id number] Z12.2-Screening of respiratory organs Z87.891-Personal history of nicotine dependence   Laneta Speaks, RN

## 2024-03-29 NOTE — Patient Instructions (Signed)
 Thank you for participating in the [facility]. It was our pleasure to meet you today. We will call you with the results of your scan within the next few days. Your scan will be assigned a Lung RADS category score by the physicians reading the scans.  This Lung RADS score determines follow up scanning.  See below for description of categories, and follow up screening recommendations. We will be in touch to schedule your follow up screening annually or based on recommendations of our providers. We will fax a copy of your scan results to your Primary Care Physician, or the physician who referred you to the program, to ensure they have the results. Please call the office if you have any questions or concerns regarding your scanning experience or results.  Our office number is [phone]. Please speak with Karna Curly, RN., Karna Doom RN, or [facility] RN, and Isaiah Dover RN. They are  our Lung Cancer Screening RN.'s If They are unavailable when you call, Please leave a message on the voice mail. We will return your call at our earliest convenience.This voice mail is monitored several times a day.  Remember, if your scan is normal, we will scan you annually as long as you continue to meet the criteria for the program. (Age 71-80, Current smoker or smoker who has quit within the last 15 years). If you are a smoker, remember, quitting is the single most powerful action that you can take to decrease your risk of lung cancer and other pulmonary, breathing related problems. We know quitting is hard, and we are here to help.  Please let us  know if there is anything we can do to help you meet your goal of quitting. If you are a former smoker, counselling psychologist. We are proud of you! Remain smoke free! Remember you can refer friends or family members through the number above.  We will screen them to make sure they meet criteria for the program. Thank you for helping us   take better care of you by participating in Lung Screening.  For Virtual Smoking Cessation Classes , The American Lung Association Provides  Freedom From Smoking Classes.  Please search their website for dates and times.    Lung RADS Categories:  Lung RADS 1: no nodules or definitely non-concerning nodules.  Recommendation is for a repeat annual scan in 12 months.  Lung RADS 2:  nodules that are non-concerning in appearance and behavior with a very low likelihood of becoming an active cancer. Recommendation is for a repeat annual scan in 12 months.  Lung RADS 3: nodules that are probably non-concerning , includes nodules with a low likelihood of becoming an active cancer.  Recommendation is for a 78-month repeat screening scan. Often noted after an upper respiratory illness. We will be in touch to make sure you have no questions, and to schedule your 10-month scan.  Lung RADS 4 A: nodules with concerning findings, recommendation is most often for a follow up scan in 3 months or additional testing based on our provider's assessment of the scan. We will be in touch to make sure you have no questions and to schedule the recommended 3 month follow up scan.  Lung RADS 4 B:  indicates findings that are concerning. We will be in touch with you to schedule additional diagnostic testing based on our provider's  assessment of the scan.  You can receive free nicotine  replacement therapy ( patches, gum or mints) by calling 1-800-QUIT NOW.  Please call so we can get you on the path to becoming  a non-smoker. I know it is hard, but you can do this!  Other options for assistance in smoking cessation ( As covered by your insurance benefits)  Hypnosis for smoking cessation  [employer] [phone]  Acupuncture for smoking cessation  [employer] [phone]

## 2024-03-30 ENCOUNTER — Encounter (HOSPITAL_COMMUNITY)

## 2024-04-04 ENCOUNTER — Ambulatory Visit (HOSPITAL_COMMUNITY)

## 2024-04-05 ENCOUNTER — Ambulatory Visit (HOSPITAL_COMMUNITY)

## 2024-04-06 ENCOUNTER — Ambulatory Visit (HOSPITAL_COMMUNITY)

## 2024-04-11 ENCOUNTER — Encounter (HOSPITAL_COMMUNITY)

## 2024-04-13 ENCOUNTER — Ambulatory Visit (HOSPITAL_COMMUNITY)

## 2024-04-18 ENCOUNTER — Ambulatory Visit: Admitting: Orthopedic Surgery

## 2024-04-18 ENCOUNTER — Encounter (HOSPITAL_COMMUNITY)

## 2024-04-20 ENCOUNTER — Ambulatory Visit (HOSPITAL_COMMUNITY)

## 2024-08-08 ENCOUNTER — Encounter (HOSPITAL_COMMUNITY): Admitting: Physician Assistant
